# Patient Record
Sex: Male | Born: 1970 | Race: White | Hispanic: No | Marital: Married | Smoking: Former smoker
Health system: Southern US, Community
[De-identification: ages and names within clinical notes are randomized; demographics above are authoritative.]

## PROBLEM LIST (undated history)

## (undated) DIAGNOSIS — Z87891 Personal history of nicotine dependence: Secondary | ICD-10-CM

## (undated) DIAGNOSIS — F430 Acute stress reaction: Secondary | ICD-10-CM

## (undated) DIAGNOSIS — F419 Anxiety disorder, unspecified: Secondary | ICD-10-CM

## (undated) DIAGNOSIS — M1990 Unspecified osteoarthritis, unspecified site: Secondary | ICD-10-CM

## (undated) DIAGNOSIS — I10 Essential (primary) hypertension: Secondary | ICD-10-CM

## (undated) DIAGNOSIS — R5383 Other fatigue: Secondary | ICD-10-CM

## (undated) DIAGNOSIS — Z720 Tobacco use: Secondary | ICD-10-CM

## (undated) DIAGNOSIS — I739 Peripheral vascular disease, unspecified: Secondary | ICD-10-CM

## (undated) DIAGNOSIS — M170 Bilateral primary osteoarthritis of knee: Secondary | ICD-10-CM

## (undated) DIAGNOSIS — M545 Low back pain, unspecified: Secondary | ICD-10-CM

## (undated) DIAGNOSIS — E291 Testicular hypofunction: Secondary | ICD-10-CM

## (undated) DIAGNOSIS — E785 Hyperlipidemia, unspecified: Secondary | ICD-10-CM

## (undated) DIAGNOSIS — M542 Cervicalgia: Principal | ICD-10-CM

## (undated) DIAGNOSIS — B019 Varicella without complication: Secondary | ICD-10-CM

## (undated) DIAGNOSIS — G8929 Other chronic pain: Secondary | ICD-10-CM

## (undated) DIAGNOSIS — F411 Generalized anxiety disorder: Secondary | ICD-10-CM

## (undated) HISTORY — DX: Bilateral primary osteoarthritis of knee: M17.0

## (undated) HISTORY — DX: Peripheral vascular disease, unspecified: I73.9

## (undated) HISTORY — DX: Generalized anxiety disorder: F41.1

## (undated) HISTORY — DX: Personal history of nicotine dependence: Z87.891

## (undated) HISTORY — DX: Tobacco use: Z72.0

## (undated) HISTORY — DX: Hyperlipidemia, unspecified: E78.5

## (undated) HISTORY — PX: MULTIPLE TOOTH EXTRACTIONS: SHX2053

## (undated) HISTORY — DX: Essential (primary) hypertension: I10

## (undated) HISTORY — PX: WISDOM TOOTH EXTRACTION: SHX21

## (undated) HISTORY — DX: Varicella without complication: B01.9

## (undated) HISTORY — DX: Other fatigue: R53.83

## (undated) HISTORY — PX: KNEE ARTHROSCOPY: SHX127

## (undated) HISTORY — DX: Acute stress reaction: F43.0

## (undated) HISTORY — DX: Anxiety disorder, unspecified: F41.9

## (undated) HISTORY — PX: BACK SURGERY: SHX140

## (undated) HISTORY — DX: Cervicalgia: M54.2

## (undated) HISTORY — DX: Testicular hypofunction: E29.1

---

## 2012-08-19 ENCOUNTER — Encounter: Payer: Self-pay | Admitting: Family Medicine

## 2012-08-19 ENCOUNTER — Ambulatory Visit (INDEPENDENT_AMBULATORY_CARE_PROVIDER_SITE_OTHER): Payer: BC Managed Care – PPO | Admitting: Family Medicine

## 2012-08-19 VITALS — BP 140/80 | HR 75 | Temp 97.3°F | Ht 70.0 in | Wt 184.0 lb

## 2012-08-19 DIAGNOSIS — Z720 Tobacco use: Secondary | ICD-10-CM

## 2012-08-19 DIAGNOSIS — M170 Bilateral primary osteoarthritis of knee: Secondary | ICD-10-CM | POA: Insufficient documentation

## 2012-08-19 DIAGNOSIS — Z0000 Encounter for general adult medical examination without abnormal findings: Secondary | ICD-10-CM

## 2012-08-19 DIAGNOSIS — F17200 Nicotine dependence, unspecified, uncomplicated: Secondary | ICD-10-CM

## 2012-08-19 DIAGNOSIS — Z87891 Personal history of nicotine dependence: Secondary | ICD-10-CM | POA: Insufficient documentation

## 2012-08-19 DIAGNOSIS — M5410 Radiculopathy, site unspecified: Secondary | ICD-10-CM

## 2012-08-19 DIAGNOSIS — E785 Hyperlipidemia, unspecified: Secondary | ICD-10-CM

## 2012-08-19 DIAGNOSIS — IMO0002 Reserved for concepts with insufficient information to code with codable children: Secondary | ICD-10-CM

## 2012-08-19 DIAGNOSIS — M542 Cervicalgia: Secondary | ICD-10-CM

## 2012-08-19 LAB — RENAL FUNCTION PANEL
Albumin: 4.2 g/dL (ref 3.5–5.2)
BUN: 8 mg/dL (ref 6–23)
CO2: 31 mEq/L (ref 19–32)
Calcium: 9.6 mg/dL (ref 8.4–10.5)
Creatinine, Ser: 0.9 mg/dL (ref 0.4–1.5)
Sodium: 140 mEq/L (ref 135–145)

## 2012-08-19 LAB — CBC
HCT: 42.6 % (ref 39.0–52.0)
Hemoglobin: 14.1 g/dL (ref 13.0–17.0)
Platelets: 191 10*3/uL (ref 150.0–400.0)
RBC: 4.65 Mil/uL (ref 4.22–5.81)
WBC: 8.1 10*3/uL (ref 4.5–10.5)

## 2012-08-19 LAB — HEPATIC FUNCTION PANEL
AST: 29 U/L (ref 0–37)
Albumin: 4.2 g/dL (ref 3.5–5.2)
Alkaline Phosphatase: 75 U/L (ref 39–117)

## 2012-08-19 LAB — TSH: TSH: 0.31 u[IU]/mL — ABNORMAL LOW (ref 0.35–5.50)

## 2012-08-19 LAB — LIPID PANEL
Total CHOL/HDL Ratio: 8
VLDL: 63.8 mg/dL — ABNORMAL HIGH (ref 0.0–40.0)

## 2012-08-19 MED ORDER — PREDNISONE 20 MG PO TABS: ORAL_TABLET | ORAL | Status: DC

## 2012-08-19 MED ORDER — CYCLOBENZAPRINE HCL 10 MG PO TABS: 10.0000 mg | ORAL_TABLET | Freq: Three times a day (TID) | ORAL | Status: DC | PRN

## 2012-08-19 NOTE — Patient Instructions (Addendum)
Preventive Care for Adults, Male A healthy lifestyle and preventative care can promote health and wellness. Preventative health guidelines for men include the following key practices:  A routine yearly physical is a good way to check with your caregiver about your health and preventative screening. It is a chance to share any concerns and updates on your health, and to receive a thorough exam.   Visit your dentist for a routine exam and preventative care every 6 months. Brush your teeth twice a day and floss once a day. Good oral hygiene prevents tooth decay and gum disease.   The frequency of eye exams is based on your age, health, family medical history, use of contact lenses, and other factors. Follow your caregiver's recommendations for frequency of eye exams.   Eat a healthy diet. Foods like vegetables, fruits, whole grains, low-fat dairy products, and lean protein foods contain the nutrients you need without too many calories. Decrease your intake of foods high in solid fats, added sugars, and salt. Eat the right amount of calories for you.Get information about a proper diet from your caregiver, if necessary.   Regular physical exercise is one of the most important things you can do for your health. Most adults should get at least 150 minutes of moderate-intensity exercise (any activity that increases your heart rate and causes you to sweat) each week. In addition, most adults need muscle-strengthening exercises on 2 or more days a week.   Maintain a healthy weight. The body mass index (BMI) is a screening tool to identify possible weight problems. It provides an estimate of body fat based on height and weight. Your caregiver can help determine your BMI, and can help you achieve or maintain a healthy weight.For adults 20 years and older:   A BMI below 18.5 is considered underweight.   A BMI of 18.5 to 24.9 is normal.   A BMI of 25 to 29.9 is considered overweight.   A BMI of 30 and above  is considered obese.   Maintain normal blood lipids and cholesterol levels by exercising and minimizing your intake of saturated fat. Eat a balanced diet with plenty of fruit and vegetables. Blood tests for lipids and cholesterol should begin at age 20 and be repeated every 5 years. If your lipid or cholesterol levels are high, you are over 50, or you are a high risk for heart disease, you may need your cholesterol levels checked more frequently.Ongoing high lipid and cholesterol levels should be treated with medicines if diet and exercise are not effective.   If you smoke, find out from your caregiver how to quit. If you do not use tobacco, do not start.   If you choose to drink alcohol, do not exceed 2 drinks per day. One drink is considered to be 12 ounces (355 mL) of beer, 5 ounces (148 mL) of wine, or 1.5 ounces (44 mL) of liquor.   Avoid use of street drugs. Do not share needles with anyone. Ask for help if you need support or instructions about stopping the use of drugs.   High blood pressure causes heart disease and increases the risk of stroke. Your blood pressure should be checked at least every 1 to 2 years. Ongoing high blood pressure should be treated with medicines, if weight loss and exercise are not effective.   If you are 45 to 43 years old, ask your caregiver if you should take aspirin to prevent heart disease.   Diabetes screening involves taking a blood   sample to check your fasting blood sugar level. This should be done once every 3 years, after age 45, if you are within normal weight and without risk factors for diabetes. Testing should be considered at a younger age or be carried out more frequently if you are overweight and have at least 1 risk factor for diabetes.   Colorectal cancer can be detected and often prevented. Most routine colorectal cancer screening begins at the age of 50 and continues through age 75. However, your caregiver may recommend screening at an earlier  age if you have risk factors for colon cancer. On a yearly basis, your caregiver may provide home test kits to check for hidden blood in the stool. Use of a small camera at the end of a tube, to directly examine the colon (sigmoidoscopy or colonoscopy), can detect the earliest forms of colorectal cancer. Talk to your caregiver about this at age 50, when routine screening begins. Direct examination of the colon should be repeated every 5 to 10 years through age 75, unless early forms of pre-cancerous polyps or small growths are found.   Hepatitis C blood testing is recommended for all people born from 1945 through 1965 and any individual with known risks for hepatitis C.   Practice safe sex. Use condoms and avoid high-risk sexual practices to reduce the spread of sexually transmitted infections (STIs). STIs include gonorrhea, chlamydia, syphilis, trichomonas, herpes, HPV, and human immunodeficiency virus (HIV). Herpes, HIV, and HPV are viral illnesses that have no cure. They can result in disability, cancer, and death.   A one-time screening for abdominal aortic aneurysm (AAA) and surgical repair of large AAAs by sound wave imaging (ultrasonography) is recommended for ages 65 to 75 years who are current or former smokers.   Healthy men should no longer receive prostate-specific antigen (PSA) blood tests as part of routine cancer screening. Consult with your caregiver about prostate cancer screening.   Testicular cancer screening is not recommended for adult males who have no symptoms. Screening includes self-exam, caregiver exam, and other screening tests. Consult with your caregiver about any symptoms you have or any concerns you have about testicular cancer.   Use sunscreen with skin protection factor (SPF) of 30 or more. Apply sunscreen liberally and repeatedly throughout the day. You should seek shade when your shadow is shorter than you. Protect yourself by wearing long sleeves, pants, a  wide-brimmed hat, and sunglasses year round, whenever you are outdoors.   Once a month, do a whole body skin exam, using a mirror to look at the skin on your back. Notify your caregiver of new moles, moles that have irregular borders, moles that are larger than a pencil eraser, or moles that have changed in shape or color.   Stay current with required immunizations.   Influenza. You need a dose every fall (or winter). The composition of the flu vaccine changes each year, so being vaccinated once is not enough.   Pneumococcal polysaccharide. You need 1 to 2 doses if you smoke cigarettes or if you have certain chronic medical conditions. You need 1 dose at age 65 (or older) if you have never been vaccinated.   Tetanus, diphtheria, pertussis (Tdap, Td). Get 1 dose of Tdap vaccine if you are younger than age 65 years, are over 65 and have contact with an infant, are a healthcare worker, or simply want to be protected from whooping cough. After that, you need a Td booster dose every 10 years. Consult your caregiver if   you have not had at least 3 tetanus and diphtheria-containing shots sometime in your life or have a deep or dirty wound.   HPV. This vaccine is recommended for males 13 through 43 years of age. This vaccine may be given to men 22 through 43 years of age who have not completed the 3 dose series. It is recommended for men through age 26 who have sex with men or whose immune system is weakened because of HIV infection, other illness, or medications. The vaccine is given in 3 doses over 6 months.   Measles, mumps, rubella (MMR). You need at least 1 dose of MMR if you were born in 1957 or later. You may also need a 2nd dose.   Meningococcal. If you are age 19 to 21 years and a first-year college student living in a residence hall, or have one of several medical conditions, you need to get vaccinated against meningococcal disease. You may also need additional booster doses.   Zoster (shingles).  If you are age 60 years or older, you should get this vaccine.   Varicella (chickenpox). If you have never had chickenpox or you were vaccinated but received only 1 dose, talk to your caregiver to find out if you need this vaccine.   Hepatitis A. You need this vaccine if you have a specific risk factor for hepatitis A virus infection, or you simply wish to be protected from this disease. The vaccine is usually given as 2 doses, 6 to 18 months apart.   Hepatitis B. You need this vaccine if you have a specific risk factor for hepatitis B virus infection or you simply wish to be protected from this disease. The vaccine is given in 3 doses, usually over 6 months.  Preventative Service / Frequency Ages 19 to 39  Blood pressure check.** / Every 1 to 2 years.   Lipid and cholesterol check.** / Every 5 years beginning at age 20.   Hepatitis C blood test.** / For any individual with known risks for hepatitis C.   Skin self-exam. / Monthly.   Influenza immunization.** / Every year.   Pneumococcal polysaccharide immunization.** / 1 to 2 doses if you smoke cigarettes or if you have certain chronic medical conditions.   Tetanus, diphtheria, pertussis (Tdap,Td) immunization. / A one-time dose of Tdap vaccine. After that, you need a Td booster dose every 10 years.   HPV immunization. / 3 doses over 6 months, if 26 and younger.   Measles, mumps, rubella (MMR) immunization. / You need at least 1 dose of MMR if you were born in 1957 or later. You may also need a 2nd dose.   Meningococcal immunization. / 1 dose if you are age 19 to 21 years and a first-year college student living in a residence hall, or have one of several medical conditions, you need to get vaccinated against meningococcal disease. You may also need additional booster doses.   Varicella immunization.** / Consult your caregiver.   Hepatitis A immunization.** / Consult your caregiver. 2 doses, 6 to 18 months apart.   Hepatitis B  immunization.** / Consult your caregiver. 3 doses usually over 6 months.  Ages 40 to 64  Blood pressure check.** / Every 1 to 2 years.   Lipid and cholesterol check.** / Every 5 years beginning at age 20.   Fecal occult blood test (FOBT) of stool. / Every year beginning at age 50 and continuing until age 75. You may not have to do this test if   you get colonoscopy every 10 years.   Flexible sigmoidoscopy** or colonoscopy.** / Every 5 years for a flexible sigmoidoscopy or every 10 years for a colonoscopy beginning at age 50 and continuing until age 75.   Hepatitis C blood test.** / For all people born from 1945 through 1965 and any individual with known risks for hepatitis C.   Skin self-exam. / Monthly.   Influenza immunization.** / Every year.   Pneumococcal polysaccharide immunization.** / 1 to 2 doses if you smoke cigarettes or if you have certain chronic medical conditions.   Tetanus, diphtheria, pertussis (Tdap/Td) immunization.** / A one-time dose of Tdap vaccine. After that, you need a Td booster dose every 10 years.   Measles, mumps, rubella (MMR) immunization. / You need at least 1 dose of MMR if you were born in 1957 or later. You may also need a 2nd dose.   Varicella immunization.**/ Consult your caregiver.   Meningococcal immunization.** / Consult your caregiver.   Hepatitis A immunization.** / Consult your caregiver. 2 doses, 6 to 18 months apart.   Hepatitis B immunization.** / Consult your caregiver. 3 doses, usually over 6 months.  Ages 65 and over  Blood pressure check.** / Every 1 to 2 years.   Lipid and cholesterol check.**/ Every 5 years beginning at age 20.   Fecal occult blood test (FOBT) of stool. / Every year beginning at age 50 and continuing until age 75. You may not have to do this test if you get colonoscopy every 10 years.   Flexible sigmoidoscopy** or colonoscopy.** / Every 5 years for a flexible sigmoidoscopy or every 10 years for a colonoscopy  beginning at age 50 and continuing until age 75.   Hepatitis C blood test.** / For all people born from 1945 through 1965 and any individual with known risks for hepatitis C.   Abdominal aortic aneurysm (AAA) screening.** / A one-time screening for ages 65 to 75 years who are current or former smokers.   Skin self-exam. / Monthly.   Influenza immunization.** / Every year.   Pneumococcal polysaccharide immunization.** / 1 dose at age 65 (or older) if you have never been vaccinated.   Tetanus, diphtheria, pertussis (Tdap, Td) immunization. / A one-time dose of Tdap vaccine if you are over 65 and have contact with an infant, are a healthcare worker, or simply want to be protected from whooping cough. After that, you need a Td booster dose every 10 years.   Varicella immunization. ** / Consult your caregiver.   Meningococcal immunization.** / Consult your caregiver.   Hepatitis A immunization. ** / Consult your caregiver. 2 doses, 6 to 18 months apart.   Hepatitis B immunization.** / Check with your caregiver. 3 doses, usually over 6 months.  **Family history and personal history of risk and conditions may change your caregiver's recommendations. Document Released: 01/14/2002 Document Revised: 11/07/2011 Document Reviewed: 04/15/2011 [employer] Patient Information 2012 [employer]. 

## 2012-08-20 LAB — T4, FREE: Free T4: 0.76 ng/dL (ref 0.60–1.60)

## 2012-08-23 ENCOUNTER — Encounter: Payer: Self-pay | Admitting: Family Medicine

## 2012-08-23 DIAGNOSIS — Z0000 Encounter for general adult medical examination without abnormal findings: Secondary | ICD-10-CM | POA: Insufficient documentation

## 2012-08-23 DIAGNOSIS — M542 Cervicalgia: Secondary | ICD-10-CM

## 2012-08-23 DIAGNOSIS — F419 Anxiety disorder, unspecified: Secondary | ICD-10-CM

## 2012-08-23 DIAGNOSIS — E785 Hyperlipidemia, unspecified: Secondary | ICD-10-CM | POA: Insufficient documentation

## 2012-08-23 HISTORY — DX: Hyperlipidemia, unspecified: E78.5

## 2012-08-23 HISTORY — DX: Anxiety disorder, unspecified: F41.9

## 2012-08-23 HISTORY — DX: Cervicalgia: M54.2

## 2012-08-23 NOTE — Assessment & Plan Note (Signed)
Avoid trans fats, start MegaRed caps, increase exercise.

## 2012-08-23 NOTE — Assessment & Plan Note (Signed)
Will request old records, will monitor bp. Patient declines flu shot and Tdap.

## 2012-08-23 NOTE — Assessment & Plan Note (Signed)
Encouraged complete cessation. Consider e cigarette. Counseled regarding ways to cut back and quit

## 2012-08-23 NOTE — Assessment & Plan Note (Signed)
1 month ago he was moving a 400 pound child around on a dolly when he slipped off and fell on the top of his head. His coworker was able to push the side for complete flattening him. 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 He did go to ER that day and reports the x-rays were unremarkable but has had no significant further care since then. Medications have been minimally helpful. He does note tingling although he and his fingertips and pain most notably in his bicep. He notes when he squeezes the biceps the pain is somewhat relieved. He is started on a short course of steroids and given some Flexeril. One sister to completed he can restart NSAIDs. He is referred to neurosurgery for further intervention given the mechanism of his injury and the likelihood that he suffered some significant injury. He will seek immediate medical care symptoms worsen.

## 2012-08-23 NOTE — Progress Notes (Signed)
Patient ID: Elijah Marquez, male   DOB: [birth year], 41 y.o.   MRN: [id number] Elijah Marquez [id number] 1971-11-03 08/23/2012      Progress Note New Patient  Subjective  Chief Complaint  Chief Complaint  Patient presents with  . Establish Care    new patient    HPI  Patient is a 41 year old Caucasian male in today to establish care secondary to an injury he had last month. 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 It landed with direct force on the top of his head just above his forehead. Fortunately his coworker was able to push it to the side before it fully flattened him. He did present to the ER the first day and x-ray he reports was unremarkable but unfortunately since his and persistently and pain. He denies any loss of consciousness any visual or hearing changes or neurologic symptoms today the accident. He has not been left with persistent headache rating memory loss. Unfortunately he has persistent left-sided neck pain as well as radicular symptoms all the way down the left arm. He describes the pain in the shoulder but also down the arm tingling in the fingertips. He notes that he squeezes his bicep the pain is somewhat improved. Otherwise she reports using the health. He's had no other recent illness, fevers, chills, chest pain, palpitations, shortness of breath, GI or GU complaints. He notes he gets minimal relief for a very short period time with NSAIDs.  Past Medical History  Diagnosis Date  . Chicken pox as a child  . Osteoarthritis of left knee     and right knee  . Tobacco abuse   . Hyperlipidemia   . Neck pain on left side 08/23/2012  . Preventative health care 08/23/2012    Past Surgical History  Procedure Date  . Knee surgery     right knee 2012, left knee 2011  . Wisdom tooth extraction       Family History  Problem Relation Age of Onset  . Fibromyalgia Mother   . Coronary artery disease Mother   . Hypertension Mother   . Heart attack Mother   . Fibromyalgia Sister   . Hypertension Sister   . Heart disease Sister   . Other Son     mildly mentally handicap  . Seizures Son   . Lupus Sister   . Emphysema Sister     smoker    History   Social History  . Marital Status: Married    Spouse Name: N/A    Number of Children: N/A  . Years of Education: N/A   Occupational History  . Not on file.   Social History Main Topics  . Smoking status: Current Every Day Smoker -- 1.5 packs/day for 25 years    Types: Cigarettes  . Smokeless tobacco: Never Used  . Alcohol Use: No  . Drug Use: No  . Sexually Active: Yes -- Male partner(s)   Other Topics Concern  . Not on file   Social History Narrative  . No narrative on file    Current Outpatient Prescriptions on File Prior to Visit  Medication Sig Dispense Refill  . citalopram (CELEXA) 20 MG tablet Take 20 mg by mouth daily.        Allergies  Allergen Reactions  . Penicillins Swelling  and Rash    Review of Systems  Review of Systems  Constitutional: Negative for fever, chills and malaise/fatigue.  HENT: Positive for neck pain. Negative for hearing loss, nosebleeds and congestion.   Eyes: Negative for discharge.  Respiratory: Negative for cough, sputum production, shortness of breath and wheezing.   Cardiovascular: Negative for chest pain, palpitations and leg swelling.  Gastrointestinal: Negative for heartburn, nausea, vomiting, abdominal pain, diarrhea, constipation and blood in stool.  Genitourinary: Negative for dysuria, urgency, frequency and hematuria.  Musculoskeletal: Positive for joint pain. Negative for myalgias, back pain and falls.       Left sided pain base of skull to left hand  Skin: Negative for rash.  Neurological: Negative for dizziness, tremors, sensory change, focal weakness, loss of  consciousness, weakness and headaches.  Endo/Heme/Allergies: Negative for polydipsia. Does not bruise/bleed easily.  Psychiatric/Behavioral: Negative for depression and suicidal ideas. The patient is not nervous/anxious and does not have insomnia.     Objective  BP 140/80  Pulse 75  Temp 97.3 F (36.3 C) (Temporal)  Ht 5\' 10"  (1.778 m)  Wt 184 lb (83.462 kg)  BMI 26.40 kg/m2  SpO2 97%  Physical Exam  Physical Exam  Constitutional: He is oriented to person, place, and time and well-developed, well-nourished, and in no distress. No distress.  HENT:  Head: Normocephalic and atraumatic.  Eyes: Conjunctivae normal are normal.  Neck: Neck supple. No thyromegaly present.  Cardiovascular: Normal rate, regular rhythm and normal heart sounds.   No murmur heard. Pulmonary/Chest: Effort normal and breath sounds normal. No respiratory distress.  Abdominal: He exhibits no distension and no mass. There is no tenderness.  Musculoskeletal: He exhibits no edema.  Neurological: He is alert and oriented to person, place, and time.  Skin: Skin is warm.  Psychiatric: Memory, affect and judgment normal.       Assessment & Plan  Neck pain on left side 1 month ago he was moving a 400 pound child around on a dolly when he slipped off and fell on the top of his head. His coworker was able to push the side for complete flattening him. 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 He did go to ER that day and reports the x-rays were unremarkable but has had no significant further care since then. Medications have been minimally helpful. He does note tingling although he and his fingertips and pain most notably in his bicep. He notes when he squeezes the biceps the pain is somewhat relieved. He is started on a short course of steroids and given some Flexeril. One sister to completed he can restart NSAIDs. He is  referred to neurosurgery for further intervention given the mechanism of his injury and the likelihood that he suffered some significant injury. He will seek immediate medical care symptoms worsen.  Tobacco abuse Encouraged complete cessation. Consider e cigarette. Counseled regarding ways to cut back and quit  Preventative health care Will request old records, will monitor bp. Patient declines flu shot and Tdap.   Hyperlipidemia Avoid trans fats, start MegaRed caps, increase exercise.

## 2012-08-24 ENCOUNTER — Ambulatory Visit
Admission: RE | Admit: 2012-08-24 | Discharge: 2012-08-24 | Disposition: A | Discharge status: Home / Self Care | Payer: BC Managed Care – PPO | Source: Ambulatory Visit | Attending: Neurosurgery | Admitting: Neurosurgery

## 2012-08-24 ENCOUNTER — Other Ambulatory Visit: Payer: Self-pay | Admitting: Neurosurgery

## 2012-08-24 DIAGNOSIS — M542 Cervicalgia: Secondary | ICD-10-CM

## 2012-08-24 DIAGNOSIS — M5410 Radiculopathy, site unspecified: Secondary | ICD-10-CM

## 2012-08-25 ENCOUNTER — Encounter: Payer: Self-pay | Admitting: Family Medicine

## 2012-09-18 ENCOUNTER — Ambulatory Visit (INDEPENDENT_AMBULATORY_CARE_PROVIDER_SITE_OTHER): Payer: BC Managed Care – PPO | Admitting: Family Medicine

## 2012-09-18 ENCOUNTER — Encounter: Payer: Self-pay | Admitting: Family Medicine

## 2012-09-18 VITALS — BP 152/95 | HR 84 | Temp 97.6°F | Ht 70.0 in | Wt 180.8 lb

## 2012-09-18 DIAGNOSIS — F341 Dysthymic disorder: Secondary | ICD-10-CM

## 2012-09-18 DIAGNOSIS — F329 Major depressive disorder, single episode, unspecified: Secondary | ICD-10-CM

## 2012-09-18 DIAGNOSIS — Z720 Tobacco use: Secondary | ICD-10-CM

## 2012-09-18 DIAGNOSIS — F17200 Nicotine dependence, unspecified, uncomplicated: Secondary | ICD-10-CM

## 2012-09-18 DIAGNOSIS — F32A Depression, unspecified: Secondary | ICD-10-CM

## 2012-09-18 DIAGNOSIS — F411 Generalized anxiety disorder: Secondary | ICD-10-CM

## 2012-09-18 DIAGNOSIS — R4589 Other symptoms and signs involving emotional state: Secondary | ICD-10-CM

## 2012-09-18 DIAGNOSIS — M542 Cervicalgia: Secondary | ICD-10-CM

## 2012-09-18 DIAGNOSIS — R52 Pain, unspecified: Secondary | ICD-10-CM

## 2012-09-18 MED ORDER — OXYCODONE-ACETAMINOPHEN 7.5-325 MG PO TABS: 1.0000 | ORAL_TABLET | ORAL | Status: DC | PRN

## 2012-09-18 MED ORDER — NAPROXEN 500 MG PO TABS: 500.0000 mg | ORAL_TABLET | Freq: Two times a day (BID) | ORAL | Status: DC

## 2012-09-18 MED ORDER — METHOCARBAMOL 500 MG PO TABS: 500.0000 mg | ORAL_TABLET | Freq: Three times a day (TID) | ORAL | Status: DC | PRN

## 2012-09-18 MED ORDER — CITALOPRAM HYDROBROMIDE 20 MG PO TABS: 20.0000 mg | ORAL_TABLET | Freq: Every day | ORAL | Status: DC

## 2012-09-18 MED ORDER — DICLOFENAC SODIUM 3 % TD GEL: TRANSDERMAL | Status: DC

## 2012-09-20 ENCOUNTER — Encounter: Payer: Self-pay | Admitting: Family Medicine

## 2012-09-20 DIAGNOSIS — F411 Generalized anxiety disorder: Secondary | ICD-10-CM

## 2012-09-20 DIAGNOSIS — F419 Anxiety disorder, unspecified: Secondary | ICD-10-CM | POA: Insufficient documentation

## 2012-09-20 DIAGNOSIS — F430 Acute stress reaction: Secondary | ICD-10-CM

## 2012-09-20 HISTORY — DX: Generalized anxiety disorder: F43.0

## 2012-09-20 HISTORY — DX: Generalized anxiety disorder: F41.1

## 2012-09-20 NOTE — Progress Notes (Signed)
Patient ID: Elijah Marquez, male   DOB: 08-06-1971, 41 y.o.   MRN: [id number] Elijah Marquez [id number] 1971/07/13 09/20/2012      Progress Note-Follow Up  Subjective  Chief Complaint  Chief Complaint  Patient presents with  . Follow-up    1 month    HPI  Patient is a 41 year old Caucasian male who is in today for followup on neck pain. He has been seen by neurosurgery an MRI has confirmed to have significant pathology in his back secondary to his recent work related injury. He has chronic neck pain to the point where he has trouble sleeping and concentrating. He has pain into his left shoulder now has paresthesias and pain all the way into his left hand. The no new injuries or new illness. He does continue to smoke. He still struggling with anxiety due to the pain and having trouble sleeping. No chest pain, palpitations, shortness of breath, GI or GU complaints noted today appear  Past Medical History  Diagnosis Date  . Chicken pox as a child  . Osteoarthritis of left knee     and right knee  . Tobacco abuse   . Hyperlipidemia   . Neck pain on left side 08/23/2012  . Anxiety 08/23/2012  . Hyperlipidemia 08/23/2012  . Fatigue   . Anxiety as acute reaction to exceptional stress 09/20/2012    Past Surgical History  Procedure Date  . Knee surgery     right knee 2012, left knee 2011 (arthroscopy)  . Wisdom tooth extraction     Family History  Problem Relation Age of Onset  . Fibromyalgia Mother   . Coronary artery disease Mother   . Hypertension Mother   . Heart attack Mother   . Fibromyalgia Sister   . Hypertension Sister   . Heart disease Sister   . Other Son     mildly mentally handicap  . Seizures Son   . Lupus Sister   . Emphysema Sister     smoker    History   Social History  . Marital Status: Married    Spouse Name: N/A    Number of Children: N/A  . Years of Education: N/A   Occupational History  . Not on file.   Social History Main Topics  . Smoking  status: Current Every Day Smoker -- 1.5 packs/day for 25 years    Types: Cigarettes  . Smokeless tobacco: Never Used  . Alcohol Use: No  . Drug Use: No  . Sexually Active: Yes -- Male partner(s)   Other Topics Concern  . Not on file   Social History Narrative  . No narrative on file    Current Outpatient Prescriptions on File Prior to Visit  Medication Sig Dispense Refill  . ALPRAZolam (XANAX) 1 MG tablet Take 1 mg by mouth daily as needed.      . citalopram (CELEXA) 20 MG tablet Take 1 tablet (20 mg total) by mouth daily.  30 tablet  1  . cyclobenzaprine (FLEXERIL) 10 MG tablet Take 1 tablet (10 mg total) by mouth 3 (three) times daily as needed for muscle spasms.  30 tablet  0    Allergies  Allergen Reactions  . Penicillins Swelling and Rash    Review of Systems  Review of Systems  Constitutional: Negative for fever and malaise/fatigue.  HENT: Negative for congestion.   Eyes: Negative for discharge.  Respiratory: Negative for shortness of breath.   Cardiovascular: Negative for chest pain, palpitations and leg swelling.  Gastrointestinal: Negative  for nausea, abdominal pain and diarrhea.  Genitourinary: Negative for dysuria.  Musculoskeletal: Positive for myalgias. Negative for falls.  Skin: Negative for rash.  Neurological: Positive for tingling, sensory change and focal weakness. Negative for loss of consciousness and headaches.       Has begun to notice numbness and paresthesias in to left hand fingers 3 and 4  Endo/Heme/Allergies: Negative for polydipsia.  Psychiatric/Behavioral: Negative for depression and suicidal ideas. The patient is not nervous/anxious and does not have insomnia.     Objective  BP 152/95  Pulse 84  Temp 97.6 F (36.4 C) (Temporal)  Ht 5\' 10"  (1.778 m)  Wt 180 lb 12.8 oz (82.01 kg)  BMI 25.94 kg/m2  SpO2 98%  Physical Exam  Physical Exam  Constitutional: He is oriented to person, place, and time and well-developed, well-nourished,  and in no distress. No distress.  HENT:  Head: Normocephalic and atraumatic.  Eyes: Conjunctivae normal are normal.  Neck: Neck supple. No thyromegaly present.  Cardiovascular: Normal rate, regular rhythm and normal heart sounds.   No murmur heard. Pulmonary/Chest: Effort normal and breath sounds normal. No respiratory distress.  Abdominal: He exhibits no distension and no mass. There is no tenderness.  Musculoskeletal: He exhibits tenderness. He exhibits no edema.       Tender over left SCM, decreased rom left shoulder secondary to pain  Neurological: He is alert and oriented to person, place, and time.  Skin: Skin is warm.  Psychiatric: Memory, affect and judgment normal.    Lab Results  Component Value Date   TSH 0.31* 08/19/2012   Lab Results  Component Value Date   WBC 8.1 08/19/2012   HGB 14.1 08/19/2012   HCT 42.6 08/19/2012   MCV 91.7 08/19/2012   PLT 191.0 08/19/2012   Lab Results  Component Value Date   CREATININE 0.9 08/19/2012   BUN 8 08/19/2012   NA 140 08/19/2012   K 4.2 08/19/2012   CL 103 08/19/2012   CO2 31 08/19/2012   Lab Results  Component Value Date   ALT 42 08/19/2012   AST 29 08/19/2012   ALKPHOS 75 08/19/2012   BILITOT 0.4 08/19/2012   Lab Results  Component Value Date   CHOL 213* 08/19/2012   Lab Results  Component Value Date   HDL 27.70* 08/19/2012   No results found for this basename: LDLCALC   Lab Results  Component Value Date   TRIG 319.0* 08/19/2012   Lab Results  Component Value Date   CHOLHDL 8 08/19/2012     Assessment & Plan  Neck pain on left side Is now following with [facility] who have confirmed that he needs surgery to stabilize his neck s/p his injury at work. He has near debilitating pain but continues to work and hopes to continue doing so. He is having trouble sleeping or finding a comfortable position during the day. He is given a refill on Percocet 7.5 to use prn, can try Robaxin bid during the day and may maintain  Flexeril qhs, Dr Renaye Rakers would like to take him to surgery but they are waiting on approval. Patient is also given a transdermal topical ointment to try prn. No heavy lifting or strenuous activity.   Tobacco abuse Unfortunately the stress of all this pain and lack of sleep is keeping his smoking habit alive, encouraged cessation.  Anxiety as acute reaction to exceptional stress Secondary to all his pain, Celexa 20 mg daily is tolerated but not fully effectviely, may try switch  to Venlafaxine at next visit.

## 2012-09-20 NOTE — Assessment & Plan Note (Signed)
Secondary to all his pain, Celexa 20 mg daily is tolerated but not fully effectviely, may try switch to Venlafaxine at next visit.

## 2012-09-20 NOTE — Assessment & Plan Note (Signed)
Is now following with [facility] who have confirmed that he needs surgery to stabilize his neck s/p his injury at work. He has near debilitating pain but continues to work and hopes to continue doing so. He is having trouble sleeping or finding a comfortable position during the day. He is given a refill on Percocet 7.5 to use prn, can try Robaxin bid during the day and may maintain Flexeril qhs, Dr Renaye Rakers would like to take him to surgery but they are waiting on approval. Patient is also given a transdermal topical ointment to try prn. No heavy lifting or strenuous activity.

## 2012-09-20 NOTE — Assessment & Plan Note (Signed)
Unfortunately the stress of all this pain and lack of sleep is keeping his smoking habit alive, encouraged cessation.

## 2012-10-19 ENCOUNTER — Other Ambulatory Visit: Payer: Self-pay

## 2012-10-19 ENCOUNTER — Encounter: Payer: Self-pay | Admitting: Family Medicine

## 2012-10-19 ENCOUNTER — Ambulatory Visit (INDEPENDENT_AMBULATORY_CARE_PROVIDER_SITE_OTHER): Payer: BC Managed Care – PPO | Admitting: Family Medicine

## 2012-10-19 VITALS — BP 130/79 | HR 78 | Temp 97.9°F | Ht 70.0 in | Wt 181.1 lb

## 2012-10-19 DIAGNOSIS — F341 Dysthymic disorder: Secondary | ICD-10-CM

## 2012-10-19 DIAGNOSIS — F32A Depression, unspecified: Secondary | ICD-10-CM

## 2012-10-19 DIAGNOSIS — F17200 Nicotine dependence, unspecified, uncomplicated: Secondary | ICD-10-CM

## 2012-10-19 DIAGNOSIS — M542 Cervicalgia: Secondary | ICD-10-CM

## 2012-10-19 DIAGNOSIS — R4589 Other symptoms and signs involving emotional state: Secondary | ICD-10-CM

## 2012-10-19 DIAGNOSIS — F329 Major depressive disorder, single episode, unspecified: Secondary | ICD-10-CM

## 2012-10-19 DIAGNOSIS — Z720 Tobacco use: Secondary | ICD-10-CM

## 2012-10-19 DIAGNOSIS — F411 Generalized anxiety disorder: Secondary | ICD-10-CM

## 2012-10-19 MED ORDER — ALPRAZOLAM 1 MG PO TABS: 1.0000 mg | ORAL_TABLET | Freq: Two times a day (BID) | ORAL | Status: DC | PRN

## 2012-10-19 MED ORDER — DICLOFENAC SODIUM 3 % TD GEL: TRANSDERMAL | Status: DC

## 2012-10-19 NOTE — Progress Notes (Signed)
Patient ID: Elijah Marquez, male   DOB: 1971-04-22, 41 y.o.   MRN: [id number] Tavious Griesinger [id number] 11-30-1971 10/19/2012      Progress Note-Follow Up  Subjective  Chief Complaint  Chief Complaint  Patient presents with  . Follow-up    1 month    HPI  Patient is a 41 year old Caucasian male who is in today for followup. He continues to struggle with back and left upper extremity pain and weakness but is finally scheduled for surgery next month. [facility] surgery has been scheduled for December 6. His Workmen's Comp. has been approved. His pain management is better with the addition of the transdermal therapeutics cream. He continues to take his other medications on a scheduled basis and notes improved pain control. Is now able to get more than 2 hour sleep and time and thus is also able to handle his pain better. Taking all of his medications as prescribed and denies any side effects that are concerning. No GI upset. No headaches, chest pain, palpitations, shortness of breath. Unfortunately he continues to smoke a pack and a half per day  Past Medical History  Diagnosis Date  . Chicken pox as a child  . Osteoarthritis of left knee     and right knee  . Tobacco abuse   . Hyperlipidemia   . Neck pain on left side 08/23/2012  . Anxiety 08/23/2012  . Hyperlipidemia 08/23/2012  . Fatigue   . Anxiety as acute reaction to exceptional stress 09/20/2012    Past Surgical History  Procedure Date  . Knee surgery     right knee 2012, left knee 2011 (arthroscopy)  . Wisdom tooth extraction     Family History  Problem Relation Age of Onset  . Fibromyalgia Mother   . Coronary artery disease Mother   . Hypertension Mother   . Heart attack Mother   . Fibromyalgia Sister   . Hypertension Sister   . Heart disease Sister   . Other Son     mildly mentally handicap  . Seizures Son   . Lupus Sister   . Emphysema Sister     smoker    History   Social History  . Marital Status:  Married    Spouse Name: N/A    Number of Children: N/A  . Years of Education: N/A   Occupational History  . Not on file.   Social History Main Topics  . Smoking status: Current Every Day Smoker -- 1.5 packs/day for 25 years    Types: Cigarettes  . Smokeless tobacco: Never Used  . Alcohol Use: No  . Drug Use: No  . Sexually Active: Yes -- Male partner(s)   Other Topics Concern  . Not on file   Social History Narrative  . No narrative on file    Current Outpatient Prescriptions on File Prior to Visit  Medication Sig Dispense Refill  . citalopram (CELEXA) 20 MG tablet Take 1 tablet (20 mg total) by mouth daily.  30 tablet  1  . cyclobenzaprine (FLEXERIL) 10 MG tablet Take 1 tablet (10 mg total) by mouth 3 (three) times daily as needed for muscle spasms.  30 tablet  0  . methocarbamol (ROBAXIN) 500 MG tablet Take 1 tablet (500 mg total) by mouth 3 (three) times daily as needed. Can take this during the day and Flexeril at night  90 tablet  1  . naproxen (NAPROSYN) 500 MG tablet Take 1 tablet (500 mg total) by mouth 2 (two) times daily with  a meal.  60 tablet  1  . oxyCODONE-acetaminophen (PERCOCET) 7.5-325 MG per tablet Take 1 tablet by mouth every 4 (four) hours as needed for pain.  90 tablet  0    Allergies  Allergen Reactions  . Penicillins Swelling and Rash    Review of Systems  Review of Systems  Constitutional: Negative for fever and malaise/fatigue.  HENT: Positive for neck pain. Negative for congestion.   Eyes: Negative for discharge.  Respiratory: Negative for shortness of breath.   Cardiovascular: Negative for chest pain, palpitations and leg swelling.  Gastrointestinal: Negative for nausea, abdominal pain and diarrhea.  Genitourinary: Negative for dysuria.  Musculoskeletal: Positive for myalgias. Negative for falls.  Skin: Negative for rash.  Neurological: Negative for loss of consciousness and headaches.  Endo/Heme/Allergies: Negative for polydipsia.    Psychiatric/Behavioral: Positive for depression. Negative for suicidal ideas. The patient is nervous/anxious and has insomnia.     Objective  BP 130/79  Pulse 78  Temp 97.9 F (36.6 C) (Temporal)  Ht 5\' 10"  (1.778 m)  Wt 181 lb 1.9 oz (82.155 kg)  BMI 25.99 kg/m2  SpO2 97%  Physical Exam  Physical Exam  Constitutional: He is oriented to person, place, and time and well-developed, well-nourished, and in no distress. No distress.  HENT:  Head: Normocephalic and atraumatic.  Eyes: Conjunctivae normal are normal.  Neck: Neck supple. No thyromegaly present.  Cardiovascular: Normal rate, regular rhythm and normal heart sounds.   No murmur heard. Pulmonary/Chest: Effort normal and breath sounds normal. No respiratory distress.  Abdominal: He exhibits no distension and no mass. There is no tenderness.  Musculoskeletal: He exhibits no edema.  Neurological: He is alert and oriented to person, place, and time.  Skin: Skin is warm.  Psychiatric: Memory, affect and judgment normal.    Lab Results  Component Value Date   TSH 0.31* 08/19/2012   Lab Results  Component Value Date   WBC 8.1 08/19/2012   HGB 14.1 08/19/2012   HCT 42.6 08/19/2012   MCV 91.7 08/19/2012   PLT 191.0 08/19/2012   Lab Results  Component Value Date   CREATININE 0.9 08/19/2012   BUN 8 08/19/2012   NA 140 08/19/2012   K 4.2 08/19/2012   CL 103 08/19/2012   CO2 31 08/19/2012   Lab Results  Component Value Date   ALT 42 08/19/2012   AST 29 08/19/2012   ALKPHOS 75 08/19/2012   BILITOT 0.4 08/19/2012   Lab Results  Component Value Date   CHOL 213* 08/19/2012   Lab Results  Component Value Date   HDL 27.70* 08/19/2012   No results found for this basename: LDLCALC   Lab Results  Component Value Date   TRIG 319.0* 08/19/2012   Lab Results  Component Value Date   CHOLHDL 8 08/19/2012     Assessment & Plan  Tobacco abuse Unfortunately up to 1 1/2 ppd, encouraged cessation or at least to drop as much as  possible prior to surgery try nicotine replacement products  Neck pain on left side Has surgery scheduled with [facility] December 6th. Is getting better pain relief with addition of topical gel by [employer] and his oral meds.   Anxiety as acute reaction to exceptional stress Doing better with meds and improved sleep and pain control. Continue the same, may use Alprazolam prn

## 2012-10-19 NOTE — Assessment & Plan Note (Signed)
Unfortunately up to 1 1/2 ppd, encouraged cessation or at least to drop as much as possible prior to surgery try nicotine replacement products

## 2012-10-19 NOTE — Telephone Encounter (Signed)
I spoke with pharmacist and they don't see Lisinopril on pts med list. I left a message for pt to return my call or to leave a message stating what the dosage is?

## 2012-10-19 NOTE — Assessment & Plan Note (Signed)
Doing better with meds and improved sleep and pain control. Continue the same, may use Alprazolam prn

## 2012-10-19 NOTE — Assessment & Plan Note (Signed)
Has surgery scheduled with [facility]surgery December 6th. Is getting better pain relief with addition of topical gel by [employer] and his oral meds.

## 2012-10-19 NOTE — Patient Instructions (Addendum)
Try the nicotine patches, gum or Nicotrol inhalers     Nicotine Addiction Nicotine can act as both a stimulant (excites/activates) and a sedative (calms/quiets). Immediately after exposure to nicotine, there is a "kick" caused in part by the drug's stimulation of the adrenal glands and resulting discharge of adrenaline (epinephrine). The rush of adrenaline stimulates the body and causes a sudden release of sugar. This means that smokers are always slightly hyperglycemic. Hyperglycemic means that the blood sugar is high, just like in diabetics. Nicotine also decreases the amount of insulin which helps control sugar levels in the body. There is an increase in blood pressure, breathing, and the rate of heart beats.  In addition, nicotine indirectly causes a release of dopamine in the brain that controls pleasure and motivation. A similar reaction is seen with other drugs of abuse, such as cocaine and heroin. This dopamine release is thought to cause the pleasurable sensations when smoking. In some different cases, nicotine can also create a calming effect, depending on sensitivity of the smoker's nervous system and the dose of nicotine taken. WHAT HAPPENS WHEN NICOTINE IS TAKEN FOR LONG PERIODS OF TIME?  Long-term use of nicotine results in addiction. It is difficult to stop.  Repeated use of nicotine creates tolerance. Higher doses of nicotine are needed to get the "kick." When nicotine use is stopped, withdrawal may last a month or more. Withdrawal may begin within a few hours after the last cigarette. Symptoms peak within the first few days and may lessen within a few weeks. For some people, however, symptoms may last for months or longer. Withdrawal symptoms include:   Irritability.  Craving.  Learning and attention deficits.  Sleep disturbances.  Increased appetite. Craving for tobacco may last for 6 months or longer. Many behaviors done while using nicotine can also play a part in the  severity of withdrawal symptoms. For some people, the feel, smell, and sight of a cigarette and the ritual of obtaining, handling, lighting, and smoking the cigarette are closely linked with the pleasure of smoking. When stopped, they also miss the related behaviors which make the withdrawal or craving worse. While nicotine gum and patches may lessen the drug aspects of withdrawal, cravings often persist. WHAT ARE THE MEDICAL CONSEQUENCES OF NICOTINE USE?  Nicotine addiction accounts for one-third of all cancers. The top cancer caused by tobacco is lung cancer. Lung cancer is the number one cancer killer of both men and women.  Smoking is also associated with cancers of the:  Mouth.  Pharynx.  Larynx.  Esophagus.  Stomach.  Pancreas.  Cervix.  Kidney.  Ureter.  Bladder.  Smoking also causes lung diseases such as lasting (chronic) bronchitis and emphysema.  It worsens asthma in adults and children.  Smoking increases the risk of heart disease, including:  Stroke.  Heart attack.  Vascular disease.  Aneurysm.  Passive or secondary smoke can also increase medical risks including:  Asthma in children.  Sudden Infant Death Syndrome (SIDS).  Additionally, dropped cigarettes are the leading cause of residential fire fatalities.  Nicotine poisoning has been reported from accidental ingestion of tobacco products by children and pets. Death usually results in a few minutes from respiratory failure (when a person stops breathing) caused by paralysis. TREATMENT   Medication. Nicotine replacement medicines such as nicotine gum and the patch are used to stop smoking. These medicines gradually lower the dosage of nicotine in the body. These medicines do not contain the carbon monoxide and other toxins found in tobacco  smoke.  Hypnotherapy.  Relaxation therapy.  Nicotine Anonymous (a 12-step support program). Find times and locations in your local yellow pages. Document  Released: 07/24/2004 Document Revised: 02/10/2012 Document Reviewed: 12/16/2007 [facility] Patient Information 2013 [place], [place].

## 2012-10-20 NOTE — Telephone Encounter (Signed)
Pt is going to call us back tomorrow with the dosage.

## 2012-10-20 NOTE — Telephone Encounter (Signed)
Left another message for pt to return my call.

## 2012-10-28 NOTE — Telephone Encounter (Signed)
Closing note until patient calls back with information

## 2012-11-02 ENCOUNTER — Other Ambulatory Visit: Payer: Self-pay

## 2012-11-02 DIAGNOSIS — R52 Pain, unspecified: Secondary | ICD-10-CM

## 2012-11-02 MED ORDER — OXYCODONE-ACETAMINOPHEN 7.5-325 MG PO TABS: 1.0000 | ORAL_TABLET | ORAL | Status: DC | PRN

## 2012-11-02 MED ORDER — LISINOPRIL 20 MG PO TABS: 20.0000 mg | ORAL_TABLET | Freq: Every day | ORAL | Status: DC

## 2012-11-02 NOTE — Addendum Note (Signed)
Addended by: Court Joy on: 11/02/2012 01:19 PM   Modules accepted: Orders

## 2012-11-02 NOTE — Telephone Encounter (Signed)
Patient called stating that the Lisinopril he takes is 20mg . Ok to refill? Pt also stated that his work asked him to hold off for 30 days for his surgery so he agreed to it but is out of his Oxy. Please advise refill? Last RX was wrote on 09-18-12 quantity 90 with 0 refills.

## 2012-11-02 NOTE — Telephone Encounter (Signed)
Left a detailed message on patients voicemail. RX for Lisinopril sent to pharmacy and Oxy printed and at front desk

## 2012-11-02 NOTE — Telephone Encounter (Signed)
OK to both, OK to write the Lisinopril 20 mg tab, 1 tab po daily, disp #30 with 5 rf or #90 with 1 rf according to patient preference. OK to refill his Oxycodone at same strength and same sig, disp #90.

## 2012-12-08 HISTORY — PX: ANTERIOR CERVICAL DECOMP/DISCECTOMY FUSION: SHX1161

## 2012-12-21 ENCOUNTER — Ambulatory Visit: Payer: BC Managed Care – PPO | Admitting: Family Medicine

## 2012-12-21 ENCOUNTER — Other Ambulatory Visit: Payer: Self-pay | Admitting: Family Medicine

## 2012-12-21 ENCOUNTER — Encounter: Payer: Self-pay | Admitting: Family Medicine

## 2012-12-21 ENCOUNTER — Ambulatory Visit (INDEPENDENT_AMBULATORY_CARE_PROVIDER_SITE_OTHER): Payer: BC Managed Care – PPO | Admitting: Family Medicine

## 2012-12-21 VITALS — BP 140/82 | HR 90 | Temp 98.1°F | Ht 70.0 in | Wt 178.0 lb

## 2012-12-21 DIAGNOSIS — F411 Generalized anxiety disorder: Secondary | ICD-10-CM

## 2012-12-21 DIAGNOSIS — E785 Hyperlipidemia, unspecified: Secondary | ICD-10-CM

## 2012-12-21 DIAGNOSIS — R4589 Other symptoms and signs involving emotional state: Secondary | ICD-10-CM

## 2012-12-21 DIAGNOSIS — D709 Neutropenia, unspecified: Secondary | ICD-10-CM

## 2012-12-21 DIAGNOSIS — IMO0001 Reserved for inherently not codable concepts without codable children: Secondary | ICD-10-CM

## 2012-12-21 DIAGNOSIS — M542 Cervicalgia: Secondary | ICD-10-CM

## 2012-12-21 DIAGNOSIS — R030 Elevated blood-pressure reading, without diagnosis of hypertension: Secondary | ICD-10-CM

## 2012-12-21 LAB — CBC
HCT: 43.8 % (ref 39.0–52.0)
Hemoglobin: 15.6 g/dL (ref 13.0–17.0)
MCV: 86.4 fL (ref 78.0–100.0)
RBC: 5.07 MIL/uL (ref 4.22–5.81)
RDW: 13 % (ref 11.5–15.5)
WBC: 12.8 10*3/uL — ABNORMAL HIGH (ref 4.0–10.5)

## 2012-12-21 NOTE — Patient Instructions (Addendum)
Start a probiotic such as Digestive Advantage caps daily and a fiber supplement such as Metamucil daily, drink 64 oz of clear fluids If constipation occurs then try milk of magnesium 2 tbls in 4 oz of warm prune juice by mouth and repeat in 4 hours with a dulcolax suppository per rectum if no results    Constipation, Adult Constipation is when a person has fewer than 3 bowel movements a week; has difficulty having a bowel movement; or has stools that are dry, hard, or larger than normal. As people grow older, constipation is more common. If you try to fix constipation with medicines that make you have a bowel movement (laxatives), the problem may get worse. Long-term laxative use may cause the muscles of the colon to become weak. A low-fiber diet, not taking in enough fluids, and taking certain medicines may make constipation worse. CAUSES   Certain medicines, such as antidepressants, pain medicine, iron supplements, antacids, and water pills.   Certain diseases, such as diabetes, irritable bowel syndrome (IBS), thyroid disease, or depression.   Not drinking enough water.   Not eating enough fiber-rich foods.   Stress or travel.  Lack of physical activity or exercise.  Not going to the restroom when there is the urge to have a bowel movement.  Ignoring the urge to have a bowel movement.  Using laxatives too much. SYMPTOMS   Having fewer than 3 bowel movements a week.   Straining to have a bowel movement.   Having hard, dry, or larger than normal stools.   Feeling full or bloated.   Pain in the lower abdomen.  Not feeling relief after having a bowel movement. DIAGNOSIS  Your caregiver will take a medical history and perform a physical exam. Further testing may be done for severe constipation. Some tests may include:   A barium enema X-ray to examine your rectum, colon, and sometimes, your small intestine.  A sigmoidoscopy to examine your lower colon.  A colonoscopy  to examine your entire colon. TREATMENT  Treatment will depend on the severity of your constipation and what is causing it. Some dietary treatments include drinking more fluids and eating more fiber-rich foods. Lifestyle treatments may include regular exercise. If these diet and lifestyle recommendations do not help, your caregiver may recommend taking over-the-counter laxative medicines to help you have bowel movements. Prescription medicines may be prescribed if over-the-counter medicines do not work.  HOME CARE INSTRUCTIONS   Increase dietary fiber in your diet, such as fruits, vegetables, whole grains, and beans. Limit high-fat and processed sugars in your diet, such as [place] fries, hamburgers, cookies, candies, and soda.   A fiber supplement may be added to your diet if you cannot get enough fiber from foods.   Drink enough fluids to keep your urine clear or pale yellow.   Exercise regularly or as directed by your caregiver.   Go to the restroom when you have the urge to go. Do not hold it.  Only take medicines as directed by your caregiver. Do not take other medicines for constipation without talking to your caregiver first. SEEK IMMEDIATE MEDICAL CARE IF:   You have bright red blood in your stool.   Your constipation lasts for more than 4 days or gets worse.   You have abdominal or rectal pain.   You have thin, pencil-like stools.  You have unexplained weight loss. MAKE SURE YOU:   Understand these instructions.  Will watch your condition.  Will get help right away if  you are not doing well or get worse. Document Released: 08/16/2004 Document Revised: 02/10/2012 Document Reviewed: 10/22/2011 [facility] Patient Information 2013 [place], [place].

## 2012-12-27 ENCOUNTER — Encounter: Payer: Self-pay | Admitting: Family Medicine

## 2012-12-27 NOTE — Progress Notes (Signed)
Patient ID: Elijah Marquez, male   DOB: Apr 13, 1971, 42 y.o.   MRN: [id number] Elijah Marquez [id number] Sep 24, 1971 12/27/2012      Progress Note-Follow Up  Subjective  Chief Complaint  Chief Complaint  Patient presents with  . Follow-up    hospital    HPI  Patient is a 42 year old Caucasian male who is in today for followup. He had surgical correction of his neck on January 7 and is doing much better. Does note some persistent symptoms of his left arm which are slightly worse but notes his neck pain is asked improved. No headaches. No new. Seizures. No chest pain, palpitations, shortness of breath, GI or GU complaints. He does believe the combination of citalopram and alprazolam have helped him to negotiate this very stressful period  Past Medical History  Diagnosis Date  . Chicken pox as a child  . Osteoarthritis of left knee     and right knee  . Tobacco abuse   . Hyperlipidemia   . Neck pain on left side 08/23/2012  . Anxiety 08/23/2012  . Hyperlipidemia 08/23/2012  . Fatigue   . Anxiety as acute reaction to exceptional stress 09/20/2012    Past Surgical History  Procedure Date  . Knee surgery     right knee 2012, left knee 2011 (arthroscopy)  . Wisdom tooth extraction     Family History  Problem Relation Age of Onset  . Fibromyalgia Mother   . Coronary artery disease Mother   . Hypertension Mother   . Heart attack Mother   . Fibromyalgia Sister   . Hypertension Sister   . Heart disease Sister   . Other Son     mildly mentally handicap  . Seizures Son   . Lupus Sister   . Emphysema Sister     smoker    History   Social History  . Marital Status: Married    Spouse Name: N/A    Number of Children: N/A  . Years of Education: N/A   Occupational History  . Not on file.   Social History Main Topics  . Smoking status: Current Every Day Smoker -- 1.5 packs/day for 25 years    Types: Cigarettes  . Smokeless tobacco: Never Used  . Alcohol Use: No  . Drug  Use: No  . Sexually Active: Yes -- Male partner(s)   Other Topics Concern  . Not on file   Social History Narrative  . No narrative on file    Current Outpatient Prescriptions on File Prior to Visit  Medication Sig Dispense Refill  . ALPRAZolam (XANAX) 1 MG tablet Take 1 tablet (1 mg total) by mouth 2 (two) times daily as needed for sleep or anxiety.  40 tablet  1  . citalopram (CELEXA) 20 MG tablet Take 1 tablet (20 mg total) by mouth daily.  30 tablet  1  . Diclofenac Sodium 3 % GEL Transdermal topical Advanced sports cream with neuropathic component: Diclofenac, Cyclobenzaprine, baclofen, Bupivacaine 1-2 gram qid prn pain      . naproxen (NAPROSYN) 500 MG tablet Take 1 tablet (500 mg total) by mouth 2 (two) times daily with a meal.  60 tablet  1    Allergies  Allergen Reactions  . Penicillins Swelling and Rash    Review of Systems  Review of Systems  Constitutional: Negative for fever and malaise/fatigue.  HENT: Positive for neck pain. Negative for congestion.   Eyes: Negative for discharge.  Respiratory: Negative for shortness of breath.   Cardiovascular: Negative  for chest pain, palpitations and leg swelling.  Gastrointestinal: Negative for nausea, abdominal pain and diarrhea.  Genitourinary: Negative for dysuria.  Musculoskeletal: Positive for joint pain. Negative for falls.       Lue pain  Skin: Negative for rash.  Neurological: Negative for loss of consciousness and headaches.  Endo/Heme/Allergies: Negative for polydipsia.  Psychiatric/Behavioral: Negative for depression and suicidal ideas. The patient is not nervous/anxious and does not have insomnia.     Objective  BP 140/82  Pulse 90  Temp 98.1 F (36.7 C) (Oral)  Ht 5\' 10"  (1.778 m)  Wt 178 lb 0.6 oz (80.758 kg)  BMI 25.55 kg/m2  SpO2 96%  Physical Exam  Physical Exam  Constitutional: He is oriented to person, place, and time and well-developed, well-nourished, and in no distress. No distress.    HENT:  Head: Normocephalic and atraumatic.  Eyes: Conjunctivae normal are normal.  Neck: Neck supple. No thyromegaly present.       Scar base of anterior neck healing well.  Cardiovascular: Normal rate, regular rhythm and normal heart sounds.   No murmur heard. Pulmonary/Chest: Effort normal and breath sounds normal. No respiratory distress.  Abdominal: He exhibits no distension and no mass. There is no tenderness.  Musculoskeletal: He exhibits no edema.  Neurological: He is alert and oriented to person, place, and time.  Skin: Skin is warm.  Psychiatric: Memory, affect and judgment normal.    Lab Results  Component Value Date   TSH 0.31* 08/19/2012   Lab Results  Component Value Date   WBC 12.8* 12/21/2012   HGB 15.6 12/21/2012   HCT 43.8 12/21/2012   MCV 86.4 12/21/2012   PLT 277 12/21/2012   Lab Results  Component Value Date   CREATININE 0.9 08/19/2012   BUN 8 08/19/2012   NA 140 08/19/2012   K 4.2 08/19/2012   CL 103 08/19/2012   CO2 31 08/19/2012   Lab Results  Component Value Date   ALT 42 08/19/2012   AST 29 08/19/2012   ALKPHOS 75 08/19/2012   BILITOT 0.4 08/19/2012   Lab Results  Component Value Date   CHOL 213* 08/19/2012   Lab Results  Component Value Date   HDL 27.70* 08/19/2012   No results found for this basename: [facility]   Lab Results  Component Value Date   TRIG 319.0* 08/19/2012   Lab Results  Component Value Date   CHOLHDL 8 08/19/2012     Assessment & Plan  Neck pain on left side Underwent surgical correction of his on 12/08/2012. He continues to struggle with pain but does acknowledge it is improved somewhat. He does have some slightly different pain down his left arm but the worst pain in his neck is actually resolved. Continue current meds and follow closely with neurosurgery for now.  Anxiety as acute reaction to exceptional stress Tolerating citalopram and alprazolam. Continue use of both for now use alprazolam as little as possible.

## 2012-12-27 NOTE — Assessment & Plan Note (Signed)
Tolerating citalopram and alprazolam. Continue use of both for now use alprazolam as little as possible.

## 2012-12-27 NOTE — Assessment & Plan Note (Signed)
Underwent surgical correction of his on 12/08/2012. He continues to struggle with pain but does acknowledge it is improved somewhat. He does have some slightly different pain down his left arm but the worst pain in his neck is actually resolved. Continue current meds and follow closely with neurosurgery for now.

## 2013-03-12 ENCOUNTER — Telehealth: Payer: Self-pay

## 2013-03-12 MED ORDER — OXYCODONE HCL 15 MG PO TABS: 15.0000 mg | ORAL_TABLET | Freq: Four times a day (QID) | ORAL | Status: DC | PRN

## 2013-03-12 NOTE — Telephone Encounter (Signed)
Pt left a message that he doesn't see his surgeon again until June 9th and would like a refill on his Oxy? Pt would like to know if you will refill this for him? Please advise?

## 2013-03-12 NOTE — Telephone Encounter (Signed)
OK to provide the Oxycodone

## 2013-03-22 ENCOUNTER — Ambulatory Visit (INDEPENDENT_AMBULATORY_CARE_PROVIDER_SITE_OTHER): Payer: BC Managed Care – PPO | Admitting: Family Medicine

## 2013-03-22 ENCOUNTER — Encounter: Payer: Self-pay | Admitting: Family Medicine

## 2013-03-22 VITALS — BP 124/88 | HR 72 | Temp 98.2°F | Ht 70.0 in | Wt 169.2 lb

## 2013-03-22 DIAGNOSIS — R4589 Other symptoms and signs involving emotional state: Secondary | ICD-10-CM

## 2013-03-22 DIAGNOSIS — Z720 Tobacco use: Secondary | ICD-10-CM

## 2013-03-22 DIAGNOSIS — R7989 Other specified abnormal findings of blood chemistry: Secondary | ICD-10-CM

## 2013-03-22 DIAGNOSIS — B349 Viral infection, unspecified: Secondary | ICD-10-CM

## 2013-03-22 DIAGNOSIS — R946 Abnormal results of thyroid function studies: Secondary | ICD-10-CM

## 2013-03-22 DIAGNOSIS — F411 Generalized anxiety disorder: Secondary | ICD-10-CM

## 2013-03-22 DIAGNOSIS — M542 Cervicalgia: Secondary | ICD-10-CM

## 2013-03-22 DIAGNOSIS — E7800 Pure hypercholesterolemia, unspecified: Secondary | ICD-10-CM

## 2013-03-22 DIAGNOSIS — B9789 Other viral agents as the cause of diseases classified elsewhere: Secondary | ICD-10-CM

## 2013-03-22 DIAGNOSIS — F17200 Nicotine dependence, unspecified, uncomplicated: Secondary | ICD-10-CM

## 2013-03-22 DIAGNOSIS — E785 Hyperlipidemia, unspecified: Secondary | ICD-10-CM

## 2013-03-22 LAB — LIPID PANEL
Cholesterol: 155 mg/dL (ref 0–200)
HDL: 25 mg/dL — ABNORMAL LOW (ref >39)
Total CHOL/HDL Ratio: 6.2 Ratio
Triglycerides: 189 mg/dL — ABNORMAL HIGH (ref <150)

## 2013-03-22 LAB — RENAL FUNCTION PANEL
Albumin: 4.3 g/dL (ref 3.5–5.2)
Calcium: 9.4 mg/dL (ref 8.4–10.5)
Creat: 0.82 mg/dL (ref 0.50–1.35)
Glucose, Bld: 84 mg/dL (ref 70–99)
Sodium: 139 mEq/L (ref 135–145)

## 2013-03-22 LAB — HEPATIC FUNCTION PANEL
ALT: 20 U/L (ref 0–53)
Albumin: 4.3 g/dL (ref 3.5–5.2)
Bilirubin, Direct: 0.1 mg/dL (ref 0.0–0.3)
Total Protein: 7.2 g/dL (ref 6.0–8.3)

## 2013-03-22 LAB — CBC
Platelets: 187 10*3/uL (ref 150–400)
RBC: 5 MIL/uL (ref 4.22–5.81)
RDW: 13.2 % (ref 11.5–15.5)
WBC: 7.3 10*3/uL (ref 4.0–10.5)

## 2013-03-22 NOTE — Patient Instructions (Addendum)
Start a krill oil cap daily Consider a probiotic such as Digestive Advantage or a generic daily  Needs a hearing screen at the next visit.  Hypercholesterolemia High Blood Cholesterol Cholesterol is a white, waxy, fat-like protein needed by your body in small amounts. The liver makes all the cholesterol you need. It is carried from the liver by the blood through the blood vessels. Deposits (plaque) may build up on blood vessel walls. This makes the arteries narrower and stiffer. Plaque increases the risk for heart attack and stroke. You cannot feel your cholesterol level even if it is very high. The only way to know is by a blood test to check your lipid (fats) levels. Once you know your cholesterol levels, you should keep a record of the test results. Work with your caregiver to to keep your levels in the desired range. WHAT THE RESULTS MEAN:  Total cholesterol is a rough measure of all the cholesterol in your blood.  LDL is the so-called bad cholesterol. This is the type that deposits cholesterol in the walls of the arteries. You want this level to be low.  HDL is the good cholesterol because it cleans the arteries and carries the LDL away. You want this level to be high.  Triglycerides are fat that the body can either burn for energy or store. High levels are closely linked to heart disease. DESIRED LEVELS:  Total cholesterol below 200.  LDL below 100 for people at risk, below 70 for very high risk.  HDL above 50 is good, above 60 is best.  Triglycerides below 150. HOW TO LOWER YOUR CHOLESTEROL:  Diet.  Choose fish or white meat chicken and [place], roasted or baked. Limit fatty cuts of red meat, fried foods, and processed meats, such as sausage and lunch meat.  Eat lots of fresh fruits and vegetables. Choose whole grains, beans, pasta, potatoes and cereals.  Use only small amounts of olive, corn or canola oils. Avoid butter, mayonnaise, shortening or palm kernel oils. Avoid  foods with trans-fats.  Use skim/nonfat milk and low-fat/nonfat yogurt and cheeses. Avoid whole milk, cream, ice cream, egg yolks and cheeses. Healthy desserts include angel food cake, gingersnaps, animal crackers, hard candy, popsicles, and low-fat/nonfat frozen yogurt. Avoid pastries, cakes, pies and cookies.  Exercise.  A regular program helps decrease LDL and raises HDL.  Helps with weight control.  Do things that increase your activity level like gardening, walking, or taking the stairs.  Medication.  May be prescribed by your caregiver to help lowering cholesterol and the risk for heart disease.  You may need medicine even if your levels are normal if you have several risk factors. HOME CARE INSTRUCTIONS   Follow your diet and exercise programs as suggested by your caregiver.  Take medications as directed.  Have blood work done when your caregiver feels it is necessary. MAKE SURE YOU:   Understand these instructions.  Will watch your condition.  Will get help right away if you are not doing well or get worse. Document Released: 11/18/2005 Document Revised: 02/10/2012 Document Reviewed: 05/06/2007 [facility] Patient Information 2013 [place], [place].

## 2013-03-24 NOTE — Assessment & Plan Note (Signed)
Encouraged complete cessation. 

## 2013-03-24 NOTE — Progress Notes (Signed)
Patient ID: Elijah Marquez, male   DOB: 1971/02/10, 42 y.o.   MRN: [id number] Elijah Marquez [id number] [birth year] 03/24/2013      Progress Note-Follow Up  Subjective  Chief Complaint  Chief Complaint  Patient presents with  . Follow-up    3 months    HPI  Patient is a 42 year old Caucasian male who is here today and for follow up. He is doing much better since his surgery with Dr. Jeral Fruit. Still has some neck pain and left shoulder pain but it is improved. Still has some mild burning in his left neck but greatly improved. The numbness and weakness in his arm is better. No new concerns. Due to a recent cold but feels that is resolved. No headache, chest pain palpitations, shortness of breath, GI or GU complaints.  Past Medical History  Diagnosis Date  . Chicken pox as a child  . Osteoarthritis of left knee     and right knee  . Tobacco abuse   . Hyperlipidemia   . Neck pain on left side 08/23/2012  . Anxiety 08/23/2012  . Hyperlipidemia 08/23/2012  . Fatigue   . Anxiety as acute reaction to exceptional stress 09/20/2012    Past Surgical History  Procedure Laterality Date  . Knee surgery      right knee 2012, left knee 2011 (arthroscopy)  . Wisdom tooth extraction    . Neck surgery  12/08/12    Family History  Problem Relation Age of Onset  . Fibromyalgia Mother   . Coronary artery disease Mother   . Hypertension Mother   . Heart attack Mother   . Fibromyalgia Sister   . Hypertension Sister   . Heart disease Sister   . Other Son     mildly mentally handicap  . Seizures Son   . Lupus Sister   . Emphysema Sister     smoker    History   Social History  . Marital Status: Married    Spouse Name: N/A    Number of Children: N/A  . Years of Education: N/A   Occupational History  . Not on file.   Social History Main Topics  . Smoking status: Current Every Day Smoker -- 1.50 packs/day for 25 years    Types: Cigarettes  . Smokeless tobacco: Never Used  . Alcohol  Use: No  . Drug Use: No  . Sexually Active: Yes -- Male partner(s)   Other Topics Concern  . Not on file   Social History Narrative  . No narrative on file    Current Outpatient Prescriptions on File Prior to Visit  Medication Sig Dispense Refill  . ALPRAZolam (XANAX) 1 MG tablet Take 1 tablet (1 mg total) by mouth 2 (two) times daily as needed for sleep or anxiety.  40 tablet  1  . Diazepam (VALIUM PO) Take by mouth.      . naproxen (NAPROSYN) 500 MG tablet Take 1 tablet (500 mg total) by mouth 2 (two) times daily with a meal.  60 tablet  1  . oxyCODONE (ROXICODONE) 15 MG immediate release tablet Take 1 tablet (15 mg total) by mouth every 6 (six) hours as needed.  60 tablet  0  . citalopram (CELEXA) 20 MG tablet Take 1 tablet (20 mg total) by mouth daily.  30 tablet  1  . Diclofenac Sodium 3 % GEL Transdermal topical Advanced sports cream with neuropathic component: Diclofenac, Cyclobenzaprine, baclofen, Bupivacaine 1-2 gram qid prn pain       No current  facility-administered medications on file prior to visit.    Allergies  Allergen Reactions  . Penicillins Swelling and Rash    Review of Systems  Review of Systems  Constitutional: Negative for fever and malaise/fatigue.  HENT: Positive for neck pain. Negative for congestion.   Eyes: Negative for discharge.  Respiratory: Negative for shortness of breath.   Cardiovascular: Negative for chest pain, palpitations and leg swelling.  Gastrointestinal: Negative for nausea, abdominal pain and diarrhea.  Genitourinary: Negative for dysuria.  Musculoskeletal: Negative for falls.  Skin: Negative for rash.  Neurological: Negative for loss of consciousness and headaches.  Endo/Heme/Allergies: Negative for polydipsia.  Psychiatric/Behavioral: Negative for depression and suicidal ideas. The patient is not nervous/anxious and does not have insomnia.     Objective  BP 124/88  Pulse 72  Temp(Src) 98.2 F (36.8 C) (Oral)  Ht 5'  10" (1.778 m)  Wt 169 lb 4 oz (76.771 kg)  BMI 24.28 kg/m2  SpO2 96%  Physical Exam  Physical Exam  Constitutional: He is oriented to person, place, and time and well-developed, well-nourished, and in no distress. No distress.  HENT:  Head: Normocephalic and atraumatic.  Eyes: Conjunctivae are normal.  Neck: Neck supple. No thyromegaly present.  Cardiovascular: Normal rate, regular rhythm and normal heart sounds.   No murmur heard. Pulmonary/Chest: Effort normal and breath sounds normal. No respiratory distress.  Abdominal: He exhibits no distension and no mass. There is no tenderness.  Musculoskeletal: He exhibits no edema.  Neurological: He is alert and oriented to person, place, and time.  Skin: Skin is warm.  Psychiatric: Memory, affect and judgment normal.    Lab Results  Component Value Date   TSH 2.244 03/22/2013   Lab Results  Component Value Date   WBC 7.3 03/22/2013   HGB 15.3 03/22/2013   HCT 42.0 03/22/2013   MCV 84.0 03/22/2013   PLT 187 03/22/2013   Lab Results  Component Value Date   CREATININE 0.82 03/22/2013   BUN 11 03/22/2013   NA 139 03/22/2013   K 3.9 03/22/2013   CL 99 03/22/2013   CO2 31 03/22/2013   Lab Results  Component Value Date   ALT 20 03/22/2013   AST 16 03/22/2013   ALKPHOS 80 03/22/2013   BILITOT 0.4 03/22/2013   Lab Results  Component Value Date   CHOL 155 03/22/2013   Lab Results  Component Value Date   HDL 25* 03/22/2013   Lab Results  Component Value Date   LDLCALC 92 03/22/2013   Lab Results  Component Value Date   TRIG 189* 03/22/2013   Lab Results  Component Value Date   CHOLHDL 6.2 03/22/2013     Assessment & Plan  Neck pain on left side Doing better since her surgery. Given refill on Oxycodone to use prn has appt for follow up with surgeon soon.  Tobacco abuse Encouraged complete cessation  Anxiety as acute reaction to exceptional stress Doing better as his pain improves. Continue current  meds  Hyperlipidemia Mild, avoid trans fats, continue krill oil and continue to monitor

## 2013-03-24 NOTE — Assessment & Plan Note (Signed)
Mild, avoid trans fats, continue krill oil and continue to monitor

## 2013-03-24 NOTE — Assessment & Plan Note (Signed)
Doing better since her surgery. Given refill on Oxycodone to use prn has appt for follow up with surgeon soon.

## 2013-03-24 NOTE — Assessment & Plan Note (Signed)
Doing better as his pain improves. Continue current meds

## 2013-06-21 ENCOUNTER — Ambulatory Visit (INDEPENDENT_AMBULATORY_CARE_PROVIDER_SITE_OTHER): Payer: BC Managed Care – PPO | Admitting: Family Medicine

## 2013-06-21 ENCOUNTER — Encounter: Payer: Self-pay | Admitting: Family Medicine

## 2013-06-21 VITALS — BP 138/72 | HR 73 | Temp 98.2°F | Ht 70.0 in | Wt 174.0 lb

## 2013-06-21 DIAGNOSIS — F17200 Nicotine dependence, unspecified, uncomplicated: Secondary | ICD-10-CM

## 2013-06-21 DIAGNOSIS — E785 Hyperlipidemia, unspecified: Secondary | ICD-10-CM

## 2013-06-21 DIAGNOSIS — Z720 Tobacco use: Secondary | ICD-10-CM

## 2013-06-21 DIAGNOSIS — F329 Major depressive disorder, single episode, unspecified: Secondary | ICD-10-CM

## 2013-06-21 DIAGNOSIS — F341 Dysthymic disorder: Secondary | ICD-10-CM

## 2013-06-21 DIAGNOSIS — M542 Cervicalgia: Secondary | ICD-10-CM

## 2013-06-21 DIAGNOSIS — F32A Depression, unspecified: Secondary | ICD-10-CM

## 2013-06-21 MED ORDER — ALPRAZOLAM 1 MG PO TABS: 1.0000 mg | ORAL_TABLET | Freq: Two times a day (BID) | ORAL | Status: DC | PRN

## 2013-06-21 NOTE — Patient Instructions (Addendum)
Mega[employer] krill oil caps daily Labs prior to next visit, lipid, renal, cbc, tsh, hepatic   Cholesterol Cholesterol is a white, waxy, fat-like protein needed by your body in small amounts. The liver makes all the cholesterol you need. It is carried from the liver by the blood through the blood vessels. Deposits (plaque) may build up on blood vessel walls. This makes the arteries narrower and stiffer. Plaque increases the risk for heart attack and stroke. You cannot feel your cholesterol level even if it is very high. The only way to know is by a blood test to check your lipid (fats) levels. Once you know your cholesterol levels, you should keep a record of the test results. Work with your caregiver to to keep your levels in the desired range. WHAT THE RESULTS MEAN:  Total cholesterol is a rough measure of all the cholesterol in your blood.  LDL is the so-called bad cholesterol. This is the type that deposits cholesterol in the walls of the arteries. You want this level to be low.  HDL is the good cholesterol because it cleans the arteries and carries the LDL away. You want this level to be high.  Triglycerides are fat that the body can either burn for energy or store. High levels are closely linked to heart disease. DESIRED LEVELS:  Total cholesterol below 200.  LDL below 100 for people at risk, below 70 for very high risk.  HDL above 50 is good, above 60 is best.  Triglycerides below 150. HOW TO LOWER YOUR CHOLESTEROL:  Diet.  Choose fish or white meat chicken and [place], roasted or baked. Limit fatty cuts of red meat, fried foods, and processed meats, such as sausage and lunch meat.  Eat lots of fresh fruits and vegetables. Choose whole grains, beans, pasta, potatoes and cereals.  Use only small amounts of olive, corn or canola oils. Avoid butter, mayonnaise, shortening or palm kernel oils. Avoid foods with trans-fats.  Use skim/nonfat milk and low-fat/nonfat yogurt and cheeses.  Avoid whole milk, cream, ice cream, egg yolks and cheeses. Healthy desserts include angel food cake, ginger snaps, animal crackers, hard candy, popsicles, and low-fat/nonfat frozen yogurt. Avoid pastries, cakes, pies and cookies.  Exercise.  A regular program helps decrease LDL and raises HDL.  Helps with weight control.  Do things that increase your activity level like gardening, walking, or taking the stairs.  Medication.  May be prescribed by your caregiver to help lowering cholesterol and the risk for heart disease.  You may need medicine even if your levels are normal if you have several risk factors. HOME CARE INSTRUCTIONS   Follow your diet and exercise programs as suggested by your caregiver.  Take medications as directed.  Have blood work done when your caregiver feels it is necessary. MAKE SURE YOU:   Understand these instructions.  Will watch your condition.  Will get help right away if you are not doing well or get worse. Document Released: 08/13/2001 Document Revised: 02/10/2012 Document Reviewed: 02/03/2008 [facility] Patient Information 2014 [place], [place].

## 2013-06-23 NOTE — Assessment & Plan Note (Signed)
Unfortunately he is still smoking and does not feel he wants to try quitting now. Encouraged to consider quitting even with an Photographer

## 2013-06-23 NOTE — Assessment & Plan Note (Signed)
Mild, avoid trans fats, encouraged krill oil caps and attempt modest exercise

## 2013-06-23 NOTE — Assessment & Plan Note (Signed)
Patient continues to struggle with daily pain but is back to work and managing his pain with minimal meds

## 2013-06-23 NOTE — Progress Notes (Signed)
Patient ID: Elijah Marquez, male   DOB: [birth year], 42 y.o.   MRN: [id number] Elijah Marquez [id number] 07/08/1971 06/23/2013      Progress Note-Follow Up  Subjective  Chief Complaint  Chief Complaint  Patient presents with  . Follow-up    3 month    HPI  Patient is a 42 year old Caucasian male who is in today for followup. No recent illness. No new concerns. Unfortunately does continue to have trouble with chronic neck and back pain status post his injury. It is better since the surgery but pain still exists. Takes minimal meds. He continues to smoke unfortunately as much as 2 packs per day and declines effort to try and quit. Continues to work. Does feel the Celexa helps him manage the stress of his current situation fairly well. No chest pain, palpitations, shortness of breath, GI or GU concerns otherwise noted today.  Past Medical History  Diagnosis Date  . Chicken pox as a child  . Osteoarthritis of left knee     and right knee  . Tobacco abuse   . Hyperlipidemia   . Neck pain on left side 08/23/2012  . Anxiety 08/23/2012  . Hyperlipidemia 08/23/2012  . Fatigue   . Anxiety as acute reaction to exceptional stress 09/20/2012    Past Surgical History  Procedure Laterality Date  . Knee surgery      right knee 2012, left knee 2011 (arthroscopy)  . Wisdom tooth extraction    . Neck surgery  12/08/12    Family History  Problem Relation Age of Onset  . Fibromyalgia Mother   . Coronary artery disease Mother   . Hypertension Mother   . Heart attack Mother   . Fibromyalgia Sister   . Hypertension Sister   . Heart disease Sister   . Other Son     mildly mentally handicap  . Seizures Son   . Lupus Sister   . Emphysema Sister     smoker    History   Social History  . Marital Status: Married    Spouse Name: N/A    Number of Children: N/A  . Years of Education: N/A   Occupational History  . Not on file.   Social History Main Topics  . Smoking status: Current Every  Day Smoker -- 1.50 packs/day for 25 years    Types: Cigarettes  . Smokeless tobacco: Never Used  . Alcohol Use: No  . Drug Use: No  . Sexually Active: Yes -- Male partner(s)   Other Topics Concern  . Not on file   Social History Narrative  . No narrative on file    Current Outpatient Prescriptions on File Prior to Visit  Medication Sig Dispense Refill  . Diazepam (VALIUM PO) Take by mouth. Prn      . naproxen (NAPROSYN) 500 MG tablet Take 1 tablet (500 mg total) by mouth 2 (two) times daily with a meal.  60 tablet  1   No current facility-administered medications on file prior to visit.    Allergies  Allergen Reactions  . Penicillins Swelling and Rash    Review of Systems  Review of Systems  Constitutional: Negative for fever and malaise/fatigue.  HENT: Positive for neck pain. Negative for congestion.   Eyes: Negative for pain and discharge.  Respiratory: Negative for shortness of breath.   Cardiovascular: Negative for chest pain, palpitations and leg swelling.  Gastrointestinal: Negative for nausea, abdominal pain and diarrhea.  Genitourinary: Negative for dysuria.  Musculoskeletal: Positive for back pain.  Negative for falls.  Skin: Negative for rash.  Neurological: Negative for loss of consciousness and headaches.  Endo/Heme/Allergies: Negative for polydipsia.  Psychiatric/Behavioral: Negative for depression and suicidal ideas. The patient is nervous/anxious. The patient does not have insomnia.     Objective  BP 138/72  Pulse 73  Temp(Src) 98.2 F (36.8 C) (Oral)  Ht 5\' 10"  (1.778 m)  Wt 174 lb (78.926 kg)  BMI 24.97 kg/m2  SpO2 97%  Physical Exam  Physical Exam  Constitutional: He is oriented to person, place, and time and well-developed, well-nourished, and in no distress. No distress.  HENT:  Head: Normocephalic and atraumatic.  Eyes: Conjunctivae are normal.  Neck: Neck supple. No thyromegaly present.  Cardiovascular: Normal rate, regular rhythm  and normal heart sounds.  Exam reveals no gallop.   No murmur heard. Pulmonary/Chest: Effort normal and breath sounds normal. No respiratory distress.  Abdominal: He exhibits no distension and no mass. There is no tenderness.  Musculoskeletal: He exhibits no edema.  Neurological: He is alert and oriented to person, place, and time.  Skin: Skin is warm.  Psychiatric: Memory, affect and judgment normal.    Lab Results  Component Value Date   TSH 2.244 03/22/2013   Lab Results  Component Value Date   WBC 7.3 03/22/2013   HGB 15.3 03/22/2013   HCT 42.0 03/22/2013   MCV 84.0 03/22/2013   PLT 187 03/22/2013   Lab Results  Component Value Date   CREATININE 0.82 03/22/2013   BUN 11 03/22/2013   NA 139 03/22/2013   K 3.9 03/22/2013   CL 99 03/22/2013   CO2 31 03/22/2013   Lab Results  Component Value Date   ALT 20 03/22/2013   AST 16 03/22/2013   ALKPHOS 80 03/22/2013   BILITOT 0.4 03/22/2013   Lab Results  Component Value Date   CHOL 155 03/22/2013   Lab Results  Component Value Date   HDL 25* 03/22/2013   Lab Results  Component Value Date   LDLCALC 92 03/22/2013   Lab Results  Component Value Date   TRIG 189* 03/22/2013   Lab Results  Component Value Date   CHOLHDL 6.2 03/22/2013     Assessment & Plan  Neck pain on left side Patient continues to struggle with daily pain but is back to work and managing his pain with minimal meds  Tobacco abuse Unfortunately he is still smoking and does not feel he wants to try quitting now. Encouraged to consider quitting even with an Ecig  Hyperlipidemia Mild, avoid trans fats, encouraged krill oil caps and attempt modest exercise

## 2013-07-29 ENCOUNTER — Ambulatory Visit (INDEPENDENT_AMBULATORY_CARE_PROVIDER_SITE_OTHER): Payer: BC Managed Care – PPO | Admitting: Physician Assistant

## 2013-07-29 ENCOUNTER — Encounter: Payer: Self-pay | Admitting: Physician Assistant

## 2013-07-29 VITALS — BP 130/82 | HR 75 | Temp 98.0°F | Resp 18 | Ht 70.0 in | Wt 176.5 lb

## 2013-07-29 DIAGNOSIS — M545 Low back pain, unspecified: Secondary | ICD-10-CM

## 2013-07-29 DIAGNOSIS — G8929 Other chronic pain: Secondary | ICD-10-CM | POA: Insufficient documentation

## 2013-07-29 DIAGNOSIS — M549 Dorsalgia, unspecified: Secondary | ICD-10-CM | POA: Insufficient documentation

## 2013-07-29 MED ORDER — CYCLOBENZAPRINE HCL 10 MG PO TABS: 10.0000 mg | ORAL_TABLET | Freq: Three times a day (TID) | ORAL | Status: DC | PRN

## 2013-07-29 MED ORDER — METHYLPREDNISOLONE (PAK) 4 MG PO TABS: ORAL_TABLET | ORAL | Status: DC

## 2013-07-29 NOTE — Assessment & Plan Note (Signed)
Rx Medrol dose pack.  Patient to stop Naproxen until steroid course is completed.  Rx Flexeril for muscle spasm.  Moist heat/Cold compresses to area.  Topical Aspercreme.  Avoid over exertion.  Activity as tolerated.  Call or return if symptoms persist or acutely worsen

## 2013-07-29 NOTE — Patient Instructions (Signed)
Please take steroids as directed on package (Medrol Dose Pack).  Please hold off on your naproxen until you have finished the steroids.  Take muscle relaxer (Flexeril) as prescribed. Apply moist heat to affected area.  Alternate with cold compresses.  Recommend topical Aspercreme or Salon Pas.    Back Pain, Adult Low back pain is very common. About 1 in 5 people have back pain.The cause of low back pain is rarely dangerous. The pain often gets better over time.About half of people with a sudden onset of back pain feel better in just 2 weeks. About 8 in 10 people feel better by 6 weeks.  CAUSES Some common causes of back pain include:  Strain of the muscles or ligaments supporting the spine.  Wear and tear (degeneration) of the spinal discs.  Arthritis.  Direct injury to the back. DIAGNOSIS Most of the time, the direct cause of low back pain is not known.However, back pain can be treated effectively even when the exact cause of the pain is unknown.Answering your caregiver's questions about your overall health and symptoms is one of the most accurate ways to make sure the cause of your pain is not dangerous. If your caregiver needs more information, he or she may order lab work or imaging tests (X-rays or MRIs).However, even if imaging tests show changes in your back, this usually does not require surgery. HOME CARE INSTRUCTIONS For many people, back pain returns.Since low back pain is rarely dangerous, it is often a condition that people can learn to [facility] their own.   Remain active. It is stressful on the back to sit or stand in one place. Do not sit, drive, or stand in one place for more than 30 minutes at a time. Take short walks on level surfaces as soon as pain allows.Try to increase the length of time you walk each day.  Do not stay in bed.Resting more than 1 or 2 days can delay your recovery.  Do not avoid exercise or work.Your body is made to move.It is not dangerous to be  active, even though your back may hurt.Your back will likely heal faster if you return to being active before your pain is gone.  Pay attention to your body when you bend and lift. Many people have less discomfortwhen lifting if they bend their knees, keep the load close to their bodies,and avoid twisting. Often, the most comfortable positions are those that put less stress on your recovering back.  Find a comfortable position to sleep. Use a firm mattress and lie on your side with your knees slightly bent. If you lie on your back, put a pillow under your knees.  Only take over-the-counter or prescription medicines as directed by your caregiver. Over-the-counter medicines to reduce pain and inflammation are often the most helpful.Your caregiver may prescribe muscle relaxant drugs.These medicines help dull your pain so you can more quickly return to your normal activities and healthy exercise.  Put ice on the injured area.  Put ice in a plastic bag.  Place a towel between your skin and the bag.  Leave the ice on for 15-20 minutes, 3-4 times a day for the first 2 to 3 days. After that, ice and heat may be alternated to reduce pain and spasms.  Ask your caregiver about trying back exercises and gentle massage. This may be of some benefit.  Avoid feeling anxious or stressed.Stress increases muscle tension and can worsen back pain.It is important to recognize when you are anxious or  stressed and learn ways to manage it.Exercise is a great option. SEEK MEDICAL CARE IF:  You have pain that is not relieved with rest or medicine.  You have pain that does not improve in 1 week.  You have new symptoms.  You are generally not feeling well. SEEK IMMEDIATE MEDICAL CARE IF:   You have pain that radiates from your back into your legs.  You develop new bowel or bladder control problems.  You have unusual weakness or numbness in your arms or legs.  You develop nausea or vomiting.  You  develop abdominal pain.  You feel faint. Document Released: 11/18/2005 Document Revised: 05/19/2012 Document Reviewed: 04/08/2011 [facility] Patient Information 2014 [place], [place].

## 2013-07-29 NOTE — Progress Notes (Signed)
Patient ID: Elijah Marquez, male   DOB: 1971-09-16, 42 y.o.   MRN: [id number]  Patient presents to clinic today c/o stabbing low back pain with stiffness for the past 3 days.  Information was obtained from the patient.  Patient denies trauma, hx of lower back pain, numbness, tingling, radiation of pain.  Denies saddle anesthesia, or change to bowel or bladder function.  Has not tried anything for pain.  Patient does engage in heavy lifting at work occasionally, but denies recent heavy lifting.  Patient has history of OA in bilateral knee joints.  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  Past Medical History  Diagnosis Date  . Chicken pox as a child  . Osteoarthritis of left knee     and right knee  . Tobacco abuse   . Hyperlipidemia   . Neck pain on left side 08/23/2012  . Anxiety 08/23/2012  . Hyperlipidemia 08/23/2012  . Fatigue   . Anxiety as acute reaction to exceptional stress 09/20/2012    Current Outpatient Prescriptions on File Prior to Visit  Medication Sig Dispense Refill  . ALPRAZolam (XANAX) 1 MG tablet Take 1 tablet (1 mg total) by mouth 2 (two) times daily as needed for sleep or anxiety.  40 tablet  2  . Diazepam (VALIUM PO) Take by mouth. Prn      . naproxen (NAPROSYN) 500 MG tablet Take 1 tablet (500 mg total) by mouth 2 (two) times daily with a meal.  60 tablet  1   No current facility-administered medications on file prior to visit.    Allergies  Allergen Reactions  . Penicillins Swelling and Rash    Family History  Problem Relation Age of Onset  . Fibromyalgia Mother   . Coronary artery disease Mother   . Hypertension Mother   . Heart attack Mother   . Fibromyalgia Sister   . Hypertension Sister   . Heart disease Sister   . Other Son     mildly mentally handicap  . Seizures Son   . Lupus Sister   . Emphysema Sister     smoker    History   Social History  . Marital Status: Married    Spouse Name: N/A    Number of Children: N/A  .  Years of Education: N/A   Social History Main Topics  . Smoking status: Current Every Day Smoker -- 1.50 packs/day for 25 years    Types: Cigarettes  . Smokeless tobacco: Never Used  . Alcohol Use: No  . Drug Use: No  . Sexual Activity: Yes    Partners: Female   Other Topics Concern  . None   Social History Narrative  . None   ROS    Filed Vitals:   07/29/13 1035  BP: 130/82  Pulse: 75  Temp: 98 F (36.7 C)  Resp: 18   Physical Exam  Vitals reviewed. Constitutional: He is oriented to person, place, and time.  Well-developed, well-nourished caucasian gentleman in mild painful distress, sitting on examination table  HENT:  Head: Normocephalic and atraumatic.  Neck: Normal range of motion. Neck supple.  Cardiovascular: Normal rate, regular rhythm, normal heart sounds and intact distal pulses.   Pulmonary/Chest: Effort normal and breath sounds normal.  Musculoskeletal:  Decreased range of motion of lumbar spine 2/2 pain.  No bony point tenderness noted.  Positive tenderness to palpation of L lumbosacral spinous musculature with muscle spasm.  Neurological: He is alert and oriented to person, place,  and time. He has normal reflexes. No cranial nerve deficit.  Skin: Skin is warm and dry. No rash noted.   Assessment/Plan: No problem-specific assessment & plan notes found for this encounter.

## 2013-09-20 ENCOUNTER — Ambulatory Visit (INDEPENDENT_AMBULATORY_CARE_PROVIDER_SITE_OTHER): Payer: BC Managed Care – PPO | Admitting: Family Medicine

## 2013-09-20 ENCOUNTER — Encounter: Payer: Self-pay | Admitting: Family Medicine

## 2013-09-20 VITALS — BP 120/80 | HR 78 | Temp 98.1°F | Ht 70.0 in | Wt 180.1 lb

## 2013-09-20 DIAGNOSIS — M545 Low back pain, unspecified: Secondary | ICD-10-CM

## 2013-09-20 DIAGNOSIS — F17200 Nicotine dependence, unspecified, uncomplicated: Secondary | ICD-10-CM

## 2013-09-20 DIAGNOSIS — M549 Dorsalgia, unspecified: Secondary | ICD-10-CM

## 2013-09-20 DIAGNOSIS — F411 Generalized anxiety disorder: Secondary | ICD-10-CM

## 2013-09-20 DIAGNOSIS — G4700 Insomnia, unspecified: Secondary | ICD-10-CM

## 2013-09-20 DIAGNOSIS — R4589 Other symptoms and signs involving emotional state: Secondary | ICD-10-CM

## 2013-09-20 DIAGNOSIS — Z79899 Other long term (current) drug therapy: Secondary | ICD-10-CM

## 2013-09-20 DIAGNOSIS — R3915 Urgency of urination: Secondary | ICD-10-CM

## 2013-09-20 DIAGNOSIS — Z720 Tobacco use: Secondary | ICD-10-CM

## 2013-09-20 MED ORDER — HYDROCODONE-ACETAMINOPHEN 5-325 MG PO TABS: 1.0000 | ORAL_TABLET | Freq: Three times a day (TID) | ORAL | Status: DC | PRN

## 2013-09-20 MED ORDER — CYCLOBENZAPRINE HCL 10 MG PO TABS: 10.0000 mg | ORAL_TABLET | Freq: Three times a day (TID) | ORAL | Status: DC | PRN

## 2013-09-20 MED ORDER — DIAZEPAM 10 MG PO TABS: 10.0000 mg | ORAL_TABLET | Freq: Two times a day (BID) | ORAL | Status: DC | PRN

## 2013-09-20 NOTE — Progress Notes (Signed)
Patient ID: Elijah Marquez, male   DOB: 1971-04-02, 42 y.o.   MRN: [id number] Elijah Marquez [id number] 19-May-1971 09/20/2013      Progress Note-Follow Up  Subjective  Chief Complaint  Chief Complaint  Patient presents with  . Follow-up    3 month    HPI  Patient is a 42 year old Caucasian male in today for followup. He continues to struggle with chronic pain but is still working full-time. Has ongoing neck and back pain. His knees have bothered him for a long time but if really been worse lately. No recent falls or injury. No redness or warmth. Denies any other new concerns and feels she's dealing with stress as well. No recent illness, chest pain, palpitations, shortness of breath, GI or GU complaints.  Past Medical History  Diagnosis Date  . Chicken pox as a child  . Osteoarthritis of left knee     and right knee  . Tobacco abuse   . Hyperlipidemia   . Neck pain on left side 08/23/2012  . Anxiety 08/23/2012  . Hyperlipidemia 08/23/2012  . Fatigue   . Anxiety as acute reaction to exceptional stress 09/20/2012    Past Surgical History  Procedure Laterality Date  . Knee surgery      right knee 2012, left knee 2011 (arthroscopy)  . Wisdom tooth extraction    . Neck surgery  12/08/12    Family History  Problem Relation Age of Onset  . Fibromyalgia Mother   . Coronary artery disease Mother   . Hypertension Mother   . Heart attack Mother   . Fibromyalgia Sister   . Hypertension Sister   . Heart disease Sister   . Other Son     mildly mentally handicap  . Seizures Son   . Lupus Sister   . Emphysema Sister     smoker    History   Social History  . Marital Status: Married    Spouse Name: N/A    Number of Children: N/A  . Years of Education: N/A   Occupational History  . Not on file.   Social History Main Topics  . Smoking status: Current Every Day Smoker -- 1.50 packs/day for 25 years    Types: Cigarettes  . Smokeless tobacco: Never Used  . Alcohol Use: No   . Drug Use: No  . Sexual Activity: Yes    Partners: Female   Other Topics Concern  . Not on file   Social History Narrative  . No narrative on file    Current Outpatient Prescriptions on File Prior to Visit  Medication Sig Dispense Refill  . ALPRAZolam (XANAX) 1 MG tablet Take 1 tablet (1 mg total) by mouth 2 (two) times daily as needed for sleep or anxiety.  40 tablet  2  . naproxen (NAPROSYN) 500 MG tablet Take 1 tablet (500 mg total) by mouth 2 (two) times daily with a meal.  60 tablet  1   No current facility-administered medications on file prior to visit.    Allergies  Allergen Reactions  . Penicillins Swelling and Rash    Review of Systems  Review of Systems  Constitutional: Negative for fever and malaise/fatigue.  HENT: Negative for congestion.   Eyes: Negative for discharge.  Respiratory: Negative for shortness of breath.   Cardiovascular: Negative for chest pain, palpitations and leg swelling.  Gastrointestinal: Negative for nausea, abdominal pain and diarrhea.  Genitourinary: Negative for dysuria.  Musculoskeletal: Positive for back pain, joint pain, myalgias and neck pain. Negative  for falls.  Skin: Negative for rash.  Neurological: Negative for loss of consciousness and headaches.  Endo/Heme/Allergies: Negative for polydipsia.  Psychiatric/Behavioral: Positive for depression. Negative for suicidal ideas. The patient is nervous/anxious and has insomnia.     Objective  BP 120/80  Pulse 78  Temp(Src) 98.1 F (36.7 C) (Oral)  Ht 5\' 10"  (1.778 m)  Wt 180 lb 1.3 oz (81.684 kg)  BMI 25.84 kg/m2  SpO2 96%  Physical Exam  Physical Exam  Constitutional: He is oriented to person, place, and time and well-developed, well-nourished, and in no distress. No distress.  HENT:  Head: Normocephalic and atraumatic.  Eyes: Conjunctivae are normal.  Neck: Neck supple. No thyromegaly present.  Cardiovascular: Normal rate, regular rhythm and normal heart sounds.    No murmur heard. Pulmonary/Chest: Effort normal and breath sounds normal. No respiratory distress.  Abdominal: He exhibits no distension and no mass. There is no tenderness.  Musculoskeletal: He exhibits no edema.  Neurological: He is alert and oriented to person, place, and time.  Skin: Skin is warm.  Psychiatric: Memory, affect and judgment normal.    Lab Results  Component Value Date   TSH 2.244 03/22/2013   Lab Results  Component Value Date   WBC 7.3 03/22/2013   HGB 15.3 03/22/2013   HCT 42.0 03/22/2013   MCV 84.0 03/22/2013   PLT 187 03/22/2013   Lab Results  Component Value Date   CREATININE 0.82 03/22/2013   BUN 11 03/22/2013   NA 139 03/22/2013   K 3.9 03/22/2013   CL 99 03/22/2013   CO2 31 03/22/2013   Lab Results  Component Value Date   ALT 20 03/22/2013   AST 16 03/22/2013   ALKPHOS 80 03/22/2013   BILITOT 0.4 03/22/2013   Lab Results  Component Value Date   CHOL 155 03/22/2013   Lab Results  Component Value Date   HDL 25* 03/22/2013   Lab Results  Component Value Date   LDLCALC 92 03/22/2013   Lab Results  Component Value Date   TRIG 189* 03/22/2013   Lab Results  Component Value Date   CHOLHDL 6.2 03/22/2013     Assessment & Plan  Low back pain And knee pain, may continue pain meds and he will discuss further options with his orthopaedist, Dr Zola Button  Tobacco abuse Encouraged cessation, patient still smoking more than a ppd. Discussed strategies for cessation.  Anxiety as acute reaction to exceptional stress Using Alprazolam prn. Feels he is doing somewhat better.

## 2013-09-20 NOTE — Patient Instructions (Signed)
Naproxen/Aleve=Ibuprofen/Motrin/Motrin=Goody Powder/Aspirin  Tylenol/Acetaminophen is different from the above, max of 3000mg /24hr (hydrocone/APAP is also Tylenol, 325 mg of APAP) Extra strength tylenol is 500 mg per tab  Salon Pas patch to low back just at bedtime

## 2013-09-22 NOTE — Assessment & Plan Note (Signed)
Encouraged cessation, patient still smoking more than a ppd. Discussed strategies for cessation.

## 2013-09-22 NOTE — Assessment & Plan Note (Signed)
And knee pain, may continue pain meds and he will discuss further options with his orthopaedist, Dr Zola Button

## 2013-09-22 NOTE — Assessment & Plan Note (Signed)
Using Alprazolam prn. Feels he is doing somewhat better.

## 2013-10-25 ENCOUNTER — Other Ambulatory Visit: Payer: Self-pay

## 2013-10-25 DIAGNOSIS — M549 Dorsalgia, unspecified: Secondary | ICD-10-CM

## 2013-10-25 MED ORDER — HYDROCODONE-ACETAMINOPHEN 5-325 MG PO TABS: 1.0000 | ORAL_TABLET | Freq: Three times a day (TID) | ORAL | Status: DC | PRN

## 2013-10-25 NOTE — Telephone Encounter (Signed)
Pt needs an RX for his Hydrocodone. Per MD ok to print RX   Message left on pts vm that RX is ready. RX at front desk

## 2013-11-29 ENCOUNTER — Telehealth: Payer: Self-pay | Admitting: Family Medicine

## 2013-11-29 DIAGNOSIS — M549 Dorsalgia, unspecified: Secondary | ICD-10-CM

## 2013-11-29 MED ORDER — HYDROCODONE-ACETAMINOPHEN 5-325 MG PO TABS: 1.0000 | ORAL_TABLET | Freq: Three times a day (TID) | ORAL | Status: DC | PRN

## 2013-11-29 NOTE — Telephone Encounter (Signed)
Last rx given 10/25/13, #60 x no refills. Rx printed and forwarded to Provider for signature.

## 2013-11-29 NOTE — Telephone Encounter (Signed)
HYDROCODONE REFILL  °

## 2013-11-29 NOTE — Telephone Encounter (Signed)
Pt informed and RX put at front desk

## 2013-12-21 ENCOUNTER — Ambulatory Visit: Payer: BC Managed Care – PPO | Admitting: Family Medicine

## 2013-12-30 ENCOUNTER — Ambulatory Visit (INDEPENDENT_AMBULATORY_CARE_PROVIDER_SITE_OTHER): Payer: BC Managed Care – PPO | Admitting: Family Medicine

## 2013-12-30 ENCOUNTER — Encounter: Payer: Self-pay | Admitting: Family Medicine

## 2013-12-30 VITALS — BP 122/70 | HR 81 | Temp 98.4°F | Ht 70.0 in | Wt 184.1 lb

## 2013-12-30 DIAGNOSIS — F17200 Nicotine dependence, unspecified, uncomplicated: Secondary | ICD-10-CM

## 2013-12-30 DIAGNOSIS — R4589 Other symptoms and signs involving emotional state: Secondary | ICD-10-CM

## 2013-12-30 DIAGNOSIS — F329 Major depressive disorder, single episode, unspecified: Secondary | ICD-10-CM

## 2013-12-30 DIAGNOSIS — F341 Dysthymic disorder: Secondary | ICD-10-CM

## 2013-12-30 DIAGNOSIS — M542 Cervicalgia: Secondary | ICD-10-CM

## 2013-12-30 DIAGNOSIS — R52 Pain, unspecified: Secondary | ICD-10-CM

## 2013-12-30 DIAGNOSIS — F430 Acute stress reaction: Secondary | ICD-10-CM

## 2013-12-30 DIAGNOSIS — F32A Depression, unspecified: Secondary | ICD-10-CM

## 2013-12-30 DIAGNOSIS — E785 Hyperlipidemia, unspecified: Secondary | ICD-10-CM

## 2013-12-30 DIAGNOSIS — F411 Generalized anxiety disorder: Secondary | ICD-10-CM

## 2013-12-30 DIAGNOSIS — F419 Anxiety disorder, unspecified: Secondary | ICD-10-CM

## 2013-12-30 DIAGNOSIS — Z720 Tobacco use: Secondary | ICD-10-CM

## 2013-12-30 MED ORDER — HYDROCODONE-ACETAMINOPHEN 7.5-325 MG PO TABS: 1.0000 | ORAL_TABLET | Freq: Three times a day (TID) | ORAL | Status: DC | PRN

## 2013-12-30 MED ORDER — ALPRAZOLAM 1 MG PO TABS: 1.0000 mg | ORAL_TABLET | Freq: Two times a day (BID) | ORAL | Status: DC | PRN

## 2013-12-30 NOTE — Patient Instructions (Signed)

## 2013-12-30 NOTE — Progress Notes (Signed)
Pre visit review using our clinic review tool, if applicable. No additional management support is needed unless otherwise documented below in the visit note. 

## 2013-12-31 ENCOUNTER — Telehealth: Payer: Self-pay | Admitting: Family Medicine

## 2013-12-31 NOTE — Telephone Encounter (Signed)
Relevant patient education assigned to patient using Emmi. ° °

## 2014-01-02 NOTE — Assessment & Plan Note (Signed)
Better s/p surgical decompression but still needs Flexeril, Hydrocodone and Naproxen to get through many days

## 2014-01-02 NOTE — Assessment & Plan Note (Signed)
Check lipids with next visit, avoid trans fats and simple carbs.

## 2014-01-02 NOTE — Progress Notes (Signed)
Patient ID: Elijah Marquez, male   DOB: 12-08-1970, 43 y.o.   MRN: [id number] Elijah Marquez [id number] [birth year] 01/02/2014      Progress Note-Follow Up  Subjective  Chief Complaint  Chief Complaint  Patient presents with  . Follow-up    3 month    HPI  Patient is a 43 year old Caucasian male who is in today for routine followup. Feeling well. Is in a slightly less stressful job now and feels his medications are helping. He needs the septum and is not really using Valium at this time. No recent injury but he continues to have neck pain and upper extremity pain status post his injury. Manages his pain relatively well with his medications and is able to continue working. Denies headache, chest pain or palpitations. Acknowledges some anxiety. Denies GI or GU complaints. He is interested in trying to quit smoking  Past Medical History  Diagnosis Date  . Chicken pox as a child  . Osteoarthritis of left knee     and right knee  . Tobacco abuse   . Hyperlipidemia   . Neck pain on left side 08/23/2012  . Anxiety 08/23/2012  . Hyperlipidemia 08/23/2012  . Fatigue   . Anxiety as acute reaction to exceptional stress 09/20/2012    Past Surgical History  Procedure Laterality Date  . Knee surgery      right knee 2012, left knee 2011 (arthroscopy)  . Wisdom tooth extraction    . Neck surgery  12/08/12    Family History  Problem Relation Age of Onset  . Fibromyalgia Mother   . Coronary artery disease Mother   . Hypertension Mother   . Heart attack Mother   . Fibromyalgia Sister   . Hypertension Sister   . Heart disease Sister   . Other Son     mildly mentally handicap  . Seizures Son   . Lupus Sister   . Emphysema Sister     smoker    History   Social History  . Marital Status: Married    Spouse Name: N/A    Number of Children: N/A  . Years of Education: N/A   Occupational History  . Not on file.   Social History Main Topics  . Smoking status: Current Every Day Smoker  -- 1.50 packs/day for 25 years    Types: Cigarettes  . Smokeless tobacco: Never Used  . Alcohol Use: No  . Drug Use: No  . Sexual Activity: Yes    Partners: Female   Other Topics Concern  . Not on file   Social History Narrative  . No narrative on file    Current Outpatient Prescriptions on File Prior to Visit  Medication Sig Dispense Refill  . cyclobenzaprine (FLEXERIL) 10 MG tablet Take 1 tablet (10 mg total) by mouth 3 (three) times daily as needed for muscle spasms.  30 tablet  2  . naproxen (NAPROSYN) 500 MG tablet Take 1 tablet (500 mg total) by mouth 2 (two) times daily with a meal.  60 tablet  1   No current facility-administered medications on file prior to visit.    Allergies  Allergen Reactions  . Penicillins Swelling and Rash    Review of Systems  Review of Systems  Constitutional: Negative for fever, chills and malaise/fatigue.  HENT: Negative for congestion, hearing loss and nosebleeds.   Eyes: Negative for discharge.  Respiratory: Negative for cough, sputum production, shortness of breath and wheezing.   Cardiovascular: Negative for chest pain, palpitations and leg swelling.  Gastrointestinal: Negative for heartburn, nausea, vomiting, abdominal pain, diarrhea, constipation and blood in stool.  Genitourinary: Negative for dysuria, urgency, frequency and hematuria.  Musculoskeletal: Positive for back pain and neck pain. Negative for falls and myalgias.  Skin: Negative for rash.  Neurological: Negative for dizziness, tremors, sensory change, focal weakness, loss of consciousness, weakness and headaches.  Endo/Heme/Allergies: Negative for polydipsia. Does not bruise/bleed easily.  Psychiatric/Behavioral: Negative for depression and suicidal ideas. The patient is nervous/anxious and has insomnia.     Objective  BP 122/70  Pulse 81  Temp(Src) 98.4 F (36.9 C) (Oral)  Ht 5\' 10"  (1.778 m)  Wt 184 lb 1.9 oz (83.516 kg)  BMI 26.42 kg/m2  SpO2 96%  Physical  Exam  Physical Exam  Constitutional: He is oriented to person, place, and time and well-developed, well-nourished, and in no distress. No distress.  HENT:  Head: Normocephalic and atraumatic.  Eyes: Conjunctivae are normal.  Neck: Neck supple. No thyromegaly present.  Cardiovascular: Normal rate, regular rhythm and normal heart sounds.   No murmur heard. Pulmonary/Chest: Effort normal and breath sounds normal. No respiratory distress.  Abdominal: He exhibits no distension and no mass. There is no tenderness.  Musculoskeletal: He exhibits no edema.  Neurological: He is alert and oriented to person, place, and time.  Skin: Skin is warm.  Psychiatric: Memory, affect and judgment normal.    Lab Results  Component Value Date   TSH 2.244 03/22/2013   Lab Results  Component Value Date   WBC 7.3 03/22/2013   HGB 15.3 03/22/2013   HCT 42.0 03/22/2013   MCV 84.0 03/22/2013   PLT 187 03/22/2013   Lab Results  Component Value Date   CREATININE 0.82 03/22/2013   BUN 11 03/22/2013   NA 139 03/22/2013   K 3.9 03/22/2013   CL 99 03/22/2013   CO2 31 03/22/2013   Lab Results  Component Value Date   ALT 20 03/22/2013   AST 16 03/22/2013   ALKPHOS 80 03/22/2013   BILITOT 0.4 03/22/2013   Lab Results  Component Value Date   CHOL 155 03/22/2013   Lab Results  Component Value Date   HDL 25* 03/22/2013   Lab Results  Component Value Date   LDLCALC 92 03/22/2013   Lab Results  Component Value Date   TRIG 189* 03/22/2013   Lab Results  Component Value Date   CHOLHDL 6.2 03/22/2013     Assessment & Plan  Tobacco abuse Patient has failed chantix and wellbutrin, will check with his insurance to see any nicotine replacement products. Discussed tactics for quitting for more than 3 minutes and importance of doing so  Anxiety as acute reaction to exceptional stress Using diazepam less frequently, we will stop this and he may continue Alprazolam prn but encouraged to minimize use. He is in a less  stressful job now   Neck pain on left side Better s/p surgical decompression but still needs Flexeril, Hydrocodone and Naproxen to get through many days  Hyperlipidemia Check lipids with next visit, avoid trans fats and simple carbs.

## 2014-01-02 NOTE — Assessment & Plan Note (Signed)
Patient has failed chantix and wellbutrin, will check with his insurance to see any nicotine replacement products. Discussed tactics for quitting for more than 3 minutes and importance of doing so

## 2014-01-02 NOTE — Assessment & Plan Note (Signed)
Using diazepam less frequently, we will stop this and he may continue Alprazolam prn but encouraged to minimize use. He is in a less stressful job now

## 2014-01-28 ENCOUNTER — Telehealth: Payer: Self-pay | Admitting: Family Medicine

## 2014-01-28 DIAGNOSIS — R52 Pain, unspecified: Secondary | ICD-10-CM

## 2014-01-28 NOTE — Telephone Encounter (Signed)
Patient is requesting a new prescription of vicoden

## 2014-01-31 MED ORDER — HYDROCODONE-ACETAMINOPHEN 7.5-325 MG PO TABS: 1.0000 | ORAL_TABLET | Freq: Three times a day (TID) | ORAL | Status: DC | PRN

## 2014-01-31 NOTE — Telephone Encounter (Signed)
OK to refill Vicoden, same strength, same sig, #60

## 2014-01-31 NOTE — Telephone Encounter (Signed)
Please advise refill? Last RX was done on 12-30-13 quantity 60 with 0 refills

## 2014-01-31 NOTE — Telephone Encounter (Signed)
Patient informed that RX is ready to be picked up

## 2014-03-01 ENCOUNTER — Other Ambulatory Visit: Payer: Self-pay

## 2014-03-01 DIAGNOSIS — M545 Low back pain, unspecified: Secondary | ICD-10-CM

## 2014-03-01 DIAGNOSIS — R52 Pain, unspecified: Secondary | ICD-10-CM

## 2014-03-01 MED ORDER — NAPROXEN 500 MG PO TABS: 500.0000 mg | ORAL_TABLET | Freq: Two times a day (BID) | ORAL | Status: DC

## 2014-03-01 MED ORDER — HYDROCODONE-ACETAMINOPHEN 7.5-325 MG PO TABS: 1.0000 | ORAL_TABLET | Freq: Three times a day (TID) | ORAL | Status: DC | PRN

## 2014-03-01 MED ORDER — CYCLOBENZAPRINE HCL 10 MG PO TABS: 10.0000 mg | ORAL_TABLET | Freq: Three times a day (TID) | ORAL | Status: DC | PRN

## 2014-03-01 NOTE — Telephone Encounter (Signed)
Pt left a message stating he needs his Naproxen, Flexeril and Hydrocodone refilled?  RX for Hydrocodone was done on 01-31-14 quantity 60 with 0 refills  RX for Naproxen was done on 09-18-12 quantity 60 with 1 refill  RX for Flexeril was done on 09-20-13 quantity 30 with 2 refills

## 2014-03-01 NOTE — Telephone Encounter (Signed)
Pt informed RX's are ready and put at front desk

## 2014-03-25 ENCOUNTER — Other Ambulatory Visit: Payer: Self-pay | Admitting: Family Medicine

## 2014-03-25 LAB — HEPATIC FUNCTION PANEL
ALT: 33 U/L (ref 0–53)
AST: 19 U/L (ref 0–37)
Albumin: 4.5 g/dL (ref 3.5–5.2)
Alkaline Phosphatase: 91 U/L (ref 39–117)
Bilirubin, Direct: 0.1 mg/dL (ref 0.0–0.3)
Indirect Bilirubin: 0.3 mg/dL (ref 0.2–1.2)
TOTAL PROTEIN: 7.1 g/dL (ref 6.0–8.3)
Total Bilirubin: 0.4 mg/dL (ref 0.2–1.2)

## 2014-03-25 LAB — RENAL FUNCTION PANEL
ALBUMIN: 4.5 g/dL (ref 3.5–5.2)
BUN: 9 mg/dL (ref 6–23)
CALCIUM: 9.5 mg/dL (ref 8.4–10.5)
CO2: 28 mEq/L (ref 19–32)
CREATININE: 0.84 mg/dL (ref 0.50–1.35)
Chloride: 100 mEq/L (ref 96–112)
Glucose, Bld: 96 mg/dL (ref 70–99)
PHOSPHORUS: 3.2 mg/dL (ref 2.3–4.6)
Potassium: 3.8 mEq/L (ref 3.5–5.3)
Sodium: 139 mEq/L (ref 135–145)

## 2014-03-26 LAB — URINALYSIS
Bilirubin Urine: NEGATIVE
Glucose, UA: NEGATIVE mg/dL
Hgb urine dipstick: NEGATIVE
Ketones, ur: NEGATIVE mg/dL
Leukocytes, UA: NEGATIVE
Nitrite: NEGATIVE
Protein, ur: NEGATIVE mg/dL
SPECIFIC GRAVITY, URINE: 1.009 (ref 1.005–1.030)
Urobilinogen, UA: 0.2 mg/dL (ref 0.0–1.0)
pH: 6.5 (ref 5.0–8.0)

## 2014-03-31 ENCOUNTER — Encounter: Payer: Self-pay | Admitting: Family Medicine

## 2014-03-31 ENCOUNTER — Ambulatory Visit (INDEPENDENT_AMBULATORY_CARE_PROVIDER_SITE_OTHER): Payer: BC Managed Care – PPO | Admitting: Family Medicine

## 2014-03-31 ENCOUNTER — Telehealth: Payer: Self-pay

## 2014-03-31 VITALS — BP 138/80 | HR 70 | Temp 98.1°F | Ht 70.0 in | Wt 184.1 lb

## 2014-03-31 DIAGNOSIS — F419 Anxiety disorder, unspecified: Secondary | ICD-10-CM

## 2014-03-31 DIAGNOSIS — M545 Low back pain, unspecified: Secondary | ICD-10-CM

## 2014-03-31 DIAGNOSIS — Z720 Tobacco use: Secondary | ICD-10-CM

## 2014-03-31 DIAGNOSIS — R52 Pain, unspecified: Secondary | ICD-10-CM

## 2014-03-31 DIAGNOSIS — F32A Depression, unspecified: Secondary | ICD-10-CM

## 2014-03-31 DIAGNOSIS — E785 Hyperlipidemia, unspecified: Secondary | ICD-10-CM

## 2014-03-31 DIAGNOSIS — F17200 Nicotine dependence, unspecified, uncomplicated: Secondary | ICD-10-CM

## 2014-03-31 DIAGNOSIS — R5381 Other malaise: Secondary | ICD-10-CM

## 2014-03-31 DIAGNOSIS — Z0000 Encounter for general adult medical examination without abnormal findings: Secondary | ICD-10-CM

## 2014-03-31 DIAGNOSIS — F329 Major depressive disorder, single episode, unspecified: Secondary | ICD-10-CM

## 2014-03-31 DIAGNOSIS — R5383 Other fatigue: Secondary | ICD-10-CM

## 2014-03-31 LAB — HEPATIC FUNCTION PANEL
ALT: 39 U/L (ref 0–53)
AST: 20 U/L (ref 0–37)
Albumin: 4.6 g/dL (ref 3.5–5.2)
Alkaline Phosphatase: 83 U/L (ref 39–117)
BILIRUBIN DIRECT: 0.1 mg/dL (ref 0.0–0.3)
Indirect Bilirubin: 0.3 mg/dL (ref 0.2–1.2)
TOTAL PROTEIN: 7.2 g/dL (ref 6.0–8.3)
Total Bilirubin: 0.4 mg/dL (ref 0.2–1.2)

## 2014-03-31 LAB — CBC
HCT: 44.4 % (ref 39.0–52.0)
Hemoglobin: 15.8 g/dL (ref 13.0–17.0)
MCH: 30.7 pg (ref 26.0–34.0)
MCHC: 35.6 g/dL (ref 30.0–36.0)
MCV: 86.4 fL (ref 78.0–100.0)
PLATELETS: 259 10*3/uL (ref 150–400)
RBC: 5.14 MIL/uL (ref 4.22–5.81)
RDW: 13.3 % (ref 11.5–15.5)
WBC: 9.5 10*3/uL (ref 4.0–10.5)

## 2014-03-31 LAB — LIPID PANEL
CHOL/HDL RATIO: 6.3 ratio
Cholesterol: 207 mg/dL — ABNORMAL HIGH (ref 0–200)
HDL: 33 mg/dL — ABNORMAL LOW (ref >39)
LDL CALC: 119 mg/dL — AB (ref 0–99)
Triglycerides: 276 mg/dL — ABNORMAL HIGH (ref <150)
VLDL: 55 mg/dL — AB (ref 0–40)

## 2014-03-31 LAB — RENAL FUNCTION PANEL
ALBUMIN: 4.6 g/dL (ref 3.5–5.2)
BUN: 6 mg/dL (ref 6–23)
CHLORIDE: 101 meq/L (ref 96–112)
CO2: 27 mEq/L (ref 19–32)
CREATININE: 0.7 mg/dL (ref 0.50–1.35)
Calcium: 9.4 mg/dL (ref 8.4–10.5)
Glucose, Bld: 83 mg/dL (ref 70–99)
PHOSPHORUS: 3.6 mg/dL (ref 2.3–4.6)
Potassium: 4.1 mEq/L (ref 3.5–5.3)
Sodium: 138 mEq/L (ref 135–145)

## 2014-03-31 LAB — TSH: TSH: 1.302 u[IU]/mL (ref 0.350–4.500)

## 2014-03-31 LAB — TESTOSTERONE: Testosterone: 216 ng/dL — ABNORMAL LOW (ref 300–890)

## 2014-03-31 MED ORDER — CYCLOBENZAPRINE HCL 10 MG PO TABS: 10.0000 mg | ORAL_TABLET | Freq: Three times a day (TID) | ORAL | Status: DC | PRN

## 2014-03-31 MED ORDER — ALPRAZOLAM 1 MG PO TABS: 1.0000 mg | ORAL_TABLET | Freq: Two times a day (BID) | ORAL | Status: DC | PRN

## 2014-03-31 MED ORDER — VARENICLINE TARTRATE 1 MG PO TABS: 1.0000 mg | ORAL_TABLET | Freq: Two times a day (BID) | ORAL | Status: DC

## 2014-03-31 MED ORDER — NAPROXEN 500 MG PO TABS: 500.0000 mg | ORAL_TABLET | Freq: Two times a day (BID) | ORAL | Status: DC

## 2014-03-31 MED ORDER — VARENICLINE TARTRATE 0.5 MG X 11 & 1 MG X 42 PO MISC: ORAL | Status: DC

## 2014-03-31 MED ORDER — HYDROCODONE-ACETAMINOPHEN 7.5-325 MG PO TABS: 1.0000 | ORAL_TABLET | Freq: Three times a day (TID) | ORAL | Status: DC | PRN

## 2014-03-31 NOTE — Telephone Encounter (Signed)
Controlled substance form filled out for patient

## 2014-03-31 NOTE — Patient Instructions (Signed)
Nicotine Addiction Nicotine can act as both a stimulant (excites/activates) and a sedative (calms/quiets). Immediately after exposure to nicotine, there is a "kick" caused in part by the drug's stimulation of the adrenal glands and resulting discharge of adrenaline (epinephrine). The rush of adrenaline stimulates the body and causes a sudden release of sugar. This means that smokers are always slightly hyperglycemic. Hyperglycemic means that the blood sugar is high, just like in diabetics. Nicotine also decreases the amount of insulin which helps control sugar levels in the body. There is an increase in blood pressure, breathing, and the rate of heart beats.  In addition, nicotine indirectly causes a release of dopamine in the brain that controls pleasure and motivation. A similar reaction is seen with other drugs of abuse, such as cocaine and heroin. This dopamine release is thought to cause the pleasurable sensations when smoking. In some different cases, nicotine can also create a calming effect, depending on sensitivity of the smoker's nervous system and the dose of nicotine taken. WHAT HAPPENS WHEN NICOTINE IS TAKEN FOR LONG PERIODS OF TIME?  Long-term use of nicotine results in addiction. It is difficult to stop.  Repeated use of nicotine creates tolerance. Higher doses of nicotine are needed to get the "kick." When nicotine use is stopped, withdrawal may last a month or more. Withdrawal may begin within a few hours after the last cigarette. Symptoms peak within the first few days and may lessen within a few weeks. For some people, however, symptoms may last for months or longer. Withdrawal symptoms include:   Irritability.  Craving.  Learning and attention deficits.  Sleep disturbances.  Increased appetite. Craving for tobacco may last for 6 months or longer. Many behaviors done while using nicotine can also play a part in the severity of withdrawal symptoms. For some people, the feel,  smell, and sight of a cigarette and the ritual of obtaining, handling, lighting, and smoking the cigarette are closely linked with the pleasure of smoking. When stopped, they also miss the related behaviors which make the withdrawal or craving worse. While nicotine gum and patches may lessen the drug aspects of withdrawal, cravings often persist. WHAT ARE THE MEDICAL CONSEQUENCES OF NICOTINE USE?  Nicotine addiction accounts for one-third of all cancers. The top cancer caused by tobacco is lung cancer. Lung cancer is the number one cancer killer of both men and women.  Smoking is also associated with cancers of the:  Mouth.  Pharynx.  Larynx.  Esophagus.  Stomach.  Pancreas.  Cervix.  Kidney.  Ureter.  Bladder.  Smoking also causes lung diseases such as lasting (chronic) bronchitis and emphysema.  It worsens asthma in adults and children.  Smoking increases the risk of heart disease, including:  Stroke.  Heart attack.  Vascular disease.  Aneurysm.  Passive or secondary smoke can also increase medical risks including:  Asthma in children.  Sudden Infant Death Syndrome (SIDS).  Additionally, dropped cigarettes are the leading cause of residential fire fatalities.  Nicotine poisoning has been reported from accidental ingestion of tobacco products by children and pets. Death usually results in a few minutes from respiratory failure (when a person stops breathing) caused by paralysis. TREATMENT   Medication. Nicotine replacement medicines such as nicotine gum and the patch are used to stop smoking. These medicines gradually lower the dosage of nicotine in the body. These medicines do not contain the carbon monoxide and other toxins found in tobacco smoke.  Hypnotherapy.  Relaxation therapy.  Nicotine Anonymous (a 12-step support   program). Find times and locations in your local yellow pages. Document Released: 07/24/2004 Document Revised: 02/10/2012 Document  Reviewed: 12/16/2007 [employer] Patient Information 2014 [employer].  

## 2014-03-31 NOTE — Progress Notes (Signed)
Pre visit review using our clinic review tool, if applicable. No additional management support is needed unless otherwise documented below in the visit note. 

## 2014-04-01 ENCOUNTER — Telehealth: Payer: Self-pay

## 2014-04-01 DIAGNOSIS — R7989 Other specified abnormal findings of blood chemistry: Secondary | ICD-10-CM

## 2014-04-01 NOTE — Telephone Encounter (Signed)
Lab ordered.

## 2014-04-03 ENCOUNTER — Encounter: Payer: Self-pay | Admitting: Family Medicine

## 2014-04-03 DIAGNOSIS — F419 Anxiety disorder, unspecified: Secondary | ICD-10-CM

## 2014-04-03 DIAGNOSIS — F32A Depression, unspecified: Secondary | ICD-10-CM | POA: Insufficient documentation

## 2014-04-03 DIAGNOSIS — F329 Major depressive disorder, single episode, unspecified: Secondary | ICD-10-CM | POA: Insufficient documentation

## 2014-04-03 NOTE — Assessment & Plan Note (Signed)
Encouraged complete cessation. Discussed need to quit as relates to risk of numerous cancers, cardiac and pulmonary disease as well as neurologic complications. Counseled for greater than 3 minutes 

## 2014-04-03 NOTE — Assessment & Plan Note (Signed)
Encouraged heart healthy diet, increase exercise, avoid trans fats, consider a krill oil cap daily 

## 2014-04-03 NOTE — Assessment & Plan Note (Signed)
Doing well, no changes, may use Alprazolam prn

## 2014-04-03 NOTE — Assessment & Plan Note (Signed)
Allowed refill of his pain meds to use prn, Flexeril prn

## 2014-04-03 NOTE — Assessment & Plan Note (Signed)
Patient encouraged to maintain heart healthy diet, regular exercise, adequate sleep. Consider daily probiotics. Take medications as prescribed 

## 2014-04-03 NOTE — Progress Notes (Signed)
Patient ID: Elijah Marquez, male   DOB: 07-18-1971, 43 y.o.   MRN: [id number] Elijah Marquez [id number] 19-Jul-1971 04/03/2014      Progress Note-Follow Up  Subjective  Chief Complaint  No chief complaint on file.   HPI  Patient is a 43 year old male in today for routine medical care. Patient is in today for annual exam. He's been struggling with increased swelling and discomfort in his knees lately. He has ongoing back pain, neck pain and left upper extremity pain but it is improved. Pain medications are adequate. He continues to smoke 2 packs per day and acknowledges his shortness of breath is worsening. No other recent illness. Denies CP/palp/SOB/HA/congestion/fevers/GI or GU c/o. Taking meds as prescribed  Past Medical History  Diagnosis Date  . Chicken pox as a child  . Osteoarthritis of left knee     and right knee  . Tobacco abuse   . Hyperlipidemia   . Neck pain on left side 08/23/2012  . Anxiety 08/23/2012  . Hyperlipidemia 08/23/2012  . Fatigue   . Anxiety as acute reaction to exceptional stress 09/20/2012  . Back pain 07/29/2013    Past Surgical History  Procedure Laterality Date  . Knee surgery      right knee 2012, left knee 2011 (arthroscopy)  . Wisdom tooth extraction    . Neck surgery  12/08/12    Family History  Problem Relation Age of Onset  . Fibromyalgia Mother   . Coronary artery disease Mother   . Hypertension Mother   . Heart attack Mother   . Heart disease Mother     CAD  . Fibromyalgia Sister   . Hypertension Sister   . Heart disease Sister   . Other Son     mildly mentally handicap  . Seizures Son   . Lupus Sister   . Emphysema Sister     smoker    History   Social History  . Marital Status: Married    Spouse Name: N/A    Number of Children: N/A  . Years of Education: N/A   Occupational History  . Not on file.   Social History Main Topics  . Smoking status: Current Every Day Smoker -- 1.50 packs/day for 25 years    Types:  Cigarettes  . Smokeless tobacco: Never Used  . Alcohol Use: No  . Drug Use: No  . Sexual Activity: Yes    Partners: Female   Other Topics Concern  . Not on file   Social History Narrative  . No narrative on file    No current outpatient prescriptions on file prior to visit.   No current facility-administered medications on file prior to visit.    Allergies  Allergen Reactions  . Penicillins Swelling and Rash    Review of Systems  Review of Systems  Constitutional: Negative for fever.  HENT: Negative for congestion.   Eyes: Negative for discharge.  Respiratory: Positive for shortness of breath.   Cardiovascular: Negative for chest pain, palpitations and leg swelling.  Gastrointestinal: Positive for constipation. Negative for nausea, abdominal pain and diarrhea.  Genitourinary: Negative for dysuria.  Musculoskeletal: Positive for back pain and neck pain. Negative for falls.  Skin: Negative for rash.  Neurological: Negative for loss of consciousness and headaches.  Endo/Heme/Allergies: Negative for polydipsia.  Psychiatric/Behavioral: Negative for depression and suicidal ideas. The patient is not nervous/anxious and does not have insomnia.     Objective  BP 138/80  Pulse 70  Temp(Src) 98.1 F (36.7  C) (Oral)  Ht 5\' 10"  (1.778 m)  Wt 184 lb 1.9 oz (83.516 kg)  BMI 26.42 kg/m2  SpO2 98%  Physical Exam  Physical Exam  Constitutional: He is oriented to person, place, and time and well-developed, well-nourished, and in no distress. No distress.  HENT:  Head: Normocephalic and atraumatic.  Eyes: Conjunctivae are normal.  Neck: Neck supple. No thyromegaly present.  Cardiovascular: Normal rate, regular rhythm and normal heart sounds.   No murmur heard. Pulmonary/Chest: Effort normal and breath sounds normal. No respiratory distress.  Abdominal: He exhibits no distension and no mass. There is no tenderness.  Musculoskeletal: He exhibits no edema.  Neurological: He  is alert and oriented to person, place, and time.  Skin: Skin is warm.  Psychiatric: Memory, affect and judgment normal.    Lab Results  Component Value Date   TSH 1.302 03/31/2014   Lab Results  Component Value Date   WBC 9.5 03/31/2014   HGB 15.8 03/31/2014   HCT 44.4 03/31/2014   MCV 86.4 03/31/2014   PLT 259 03/31/2014   Lab Results  Component Value Date   CREATININE 0.70 03/31/2014   BUN 6 03/31/2014   NA 138 03/31/2014   K 4.1 03/31/2014   CL 101 03/31/2014   CO2 27 03/31/2014   Lab Results  Component Value Date   ALT 39 03/31/2014   AST 20 03/31/2014   ALKPHOS 83 03/31/2014   BILITOT 0.4 03/31/2014   Lab Results  Component Value Date   CHOL 207* 03/31/2014   Lab Results  Component Value Date   HDL 33* 03/31/2014   Lab Results  Component Value Date   LDLCALC 119* 03/31/2014   Lab Results  Component Value Date   TRIG 276* 03/31/2014   Lab Results  Component Value Date   CHOLHDL 6.3 03/31/2014     Assessment & Plan  Tobacco abuse Encouraged complete cessation. Discussed need to quit as relates to risk of numerous cancers, cardiac and pulmonary disease as well as neurologic complications. Counseled for greater than 3 minutes  Preventative health care Patient encouraged to maintain heart healthy diet, regular exercise, adequate sleep. Consider daily probiotics. Take medications as prescribed  Back pain Allowed refill of his pain meds to use prn, Flexeril prn  Hyperlipidemia Encouraged heart healthy diet, increase exercise, avoid trans fats, consider a krill oil cap daily  Anxiety and depression Doing well, no changes, may use Alprazolam prn

## 2014-04-28 ENCOUNTER — Other Ambulatory Visit: Payer: Self-pay

## 2014-04-28 DIAGNOSIS — R52 Pain, unspecified: Secondary | ICD-10-CM

## 2014-04-28 MED ORDER — HYDROCODONE-ACETAMINOPHEN 7.5-325 MG PO TABS: 1.0000 | ORAL_TABLET | Freq: Three times a day (TID) | ORAL | Status: DC | PRN

## 2014-04-28 NOTE — Telephone Encounter (Signed)
RX printed for md to sign  Pt informed that RX is ready to be picked up   Last RX done on 03-31-14 quantity 60 with 0 refills

## 2014-05-30 ENCOUNTER — Other Ambulatory Visit: Payer: Self-pay

## 2014-05-30 DIAGNOSIS — R52 Pain, unspecified: Secondary | ICD-10-CM

## 2014-05-30 MED ORDER — HYDROCODONE-ACETAMINOPHEN 7.5-325 MG PO TABS: 1.0000 | ORAL_TABLET | Freq: Three times a day (TID) | ORAL | Status: DC | PRN

## 2014-05-30 NOTE — Telephone Encounter (Signed)
RX printed for md to sign Pt informed that he can pick this up today or tomorrow

## 2014-06-27 ENCOUNTER — Other Ambulatory Visit: Payer: Self-pay

## 2014-06-27 DIAGNOSIS — R52 Pain, unspecified: Secondary | ICD-10-CM

## 2014-06-27 MED ORDER — HYDROCODONE-ACETAMINOPHEN 7.5-325 MG PO TABS: 1.0000 | ORAL_TABLET | Freq: Three times a day (TID) | ORAL | Status: DC | PRN

## 2014-06-27 NOTE — Telephone Encounter (Signed)
Last RX was done on 05-30-14 quantity 60 with 0 refills for the Hydrocodone  RX printed for md to sign and pt informed

## 2014-07-26 ENCOUNTER — Other Ambulatory Visit: Payer: Self-pay

## 2014-07-26 DIAGNOSIS — R52 Pain, unspecified: Secondary | ICD-10-CM

## 2014-07-26 MED ORDER — HYDROCODONE-ACETAMINOPHEN 7.5-325 MG PO TABS: 1.0000 | ORAL_TABLET | Freq: Three times a day (TID) | ORAL | Status: DC | PRN

## 2014-07-26 NOTE — Telephone Encounter (Signed)
Last RX was done 06-27-14 quantity 60 with 0 refills  Pt stated he knows its a day early but he is going out of town.  I informed patient that he can pick this up tomorrow

## 2014-08-25 ENCOUNTER — Telehealth: Payer: Self-pay | Admitting: Family Medicine

## 2014-08-25 DIAGNOSIS — R52 Pain, unspecified: Secondary | ICD-10-CM

## 2014-08-25 MED ORDER — HYDROCODONE-ACETAMINOPHEN 7.5-325 MG PO TABS: 1.0000 | ORAL_TABLET | Freq: Three times a day (TID) | ORAL | Status: DC | PRN

## 2014-08-25 NOTE — Telephone Encounter (Signed)
Caller name: Martice  Relation to pt: self  Call back number: [phone]   Reason for call: pt requesting a refill HYDROcodone-acetaminophen

## 2014-08-25 NOTE — Telephone Encounter (Signed)
RX printed for patient to pick up  Patient informed

## 2014-09-23 ENCOUNTER — Telehealth: Payer: Self-pay | Admitting: Family Medicine

## 2014-09-23 DIAGNOSIS — R52 Pain, unspecified: Secondary | ICD-10-CM

## 2014-09-23 MED ORDER — HYDROCODONE-ACETAMINOPHEN 7.5-325 MG PO TABS: 1.0000 | ORAL_TABLET | Freq: Three times a day (TID) | ORAL | Status: DC | PRN

## 2014-09-23 NOTE — Telephone Encounter (Signed)
Please inform pt that rx is ready to be picked up

## 2014-09-23 NOTE — Telephone Encounter (Signed)
Caller name: Eliyohu Relation to pt: Call back number:[phone]   Reason for call:  Pt needs refill on Rx HYDROcodone-acetaminophen (NORCO) 7.5-325 MG per tablet . Call when complete

## 2014-09-26 ENCOUNTER — Encounter: Payer: Self-pay | Admitting: Family Medicine

## 2014-09-26 ENCOUNTER — Ambulatory Visit (INDEPENDENT_AMBULATORY_CARE_PROVIDER_SITE_OTHER): Payer: BC Managed Care – PPO | Admitting: Family Medicine

## 2014-09-26 VITALS — BP 144/90 | HR 74 | Temp 97.8°F | Ht 70.0 in | Wt 187.0 lb

## 2014-09-26 DIAGNOSIS — R7989 Other specified abnormal findings of blood chemistry: Secondary | ICD-10-CM

## 2014-09-26 DIAGNOSIS — M1712 Unilateral primary osteoarthritis, left knee: Secondary | ICD-10-CM

## 2014-09-26 DIAGNOSIS — E785 Hyperlipidemia, unspecified: Secondary | ICD-10-CM

## 2014-09-26 DIAGNOSIS — Z720 Tobacco use: Secondary | ICD-10-CM

## 2014-09-26 DIAGNOSIS — R030 Elevated blood-pressure reading, without diagnosis of hypertension: Secondary | ICD-10-CM

## 2014-09-26 DIAGNOSIS — E291 Testicular hypofunction: Secondary | ICD-10-CM

## 2014-09-26 DIAGNOSIS — IMO0001 Reserved for inherently not codable concepts without codable children: Secondary | ICD-10-CM

## 2014-09-26 DIAGNOSIS — M179 Osteoarthritis of knee, unspecified: Secondary | ICD-10-CM

## 2014-09-26 LAB — HEPATIC FUNCTION PANEL
ALT: 35 U/L (ref 0–53)
AST: 26 U/L (ref 0–37)
Albumin: 3.8 g/dL (ref 3.5–5.2)
Alkaline Phosphatase: 85 U/L (ref 39–117)
BILIRUBIN TOTAL: 0.3 mg/dL (ref 0.2–1.2)
Bilirubin, Direct: 0 mg/dL (ref 0.0–0.3)
Total Protein: 7.7 g/dL (ref 6.0–8.3)

## 2014-09-26 LAB — RENAL FUNCTION PANEL
ALBUMIN: 3.8 g/dL (ref 3.5–5.2)
BUN: 9 mg/dL (ref 6–23)
CO2: 30 meq/L (ref 19–32)
Calcium: 9.4 mg/dL (ref 8.4–10.5)
Chloride: 102 mEq/L (ref 96–112)
Creatinine, Ser: 0.9 mg/dL (ref 0.4–1.5)
GFR: 96.81 mL/min (ref >60.00)
Glucose, Bld: 101 mg/dL — ABNORMAL HIGH (ref 70–99)
Phosphorus: 3.8 mg/dL (ref 2.3–4.6)
Potassium: 3.5 mEq/L (ref 3.5–5.1)
Sodium: 136 mEq/L (ref 135–145)

## 2014-09-26 LAB — LIPID PANEL
Cholesterol: 201 mg/dL — ABNORMAL HIGH (ref 0–200)
HDL: 29.7 mg/dL — ABNORMAL LOW (ref >39.00)
NonHDL: 171.3
Total CHOL/HDL Ratio: 7
Triglycerides: 391 mg/dL — ABNORMAL HIGH (ref 0.0–149.0)
VLDL: 78.2 mg/dL — AB (ref 0.0–40.0)

## 2014-09-26 LAB — CBC
HEMATOCRIT: 46.3 % (ref 39.0–52.0)
HEMOGLOBIN: 15.5 g/dL (ref 13.0–17.0)
MCHC: 33.5 g/dL (ref 30.0–36.0)
MCV: 90.8 fl (ref 78.0–100.0)
PLATELETS: 229 10*3/uL (ref 150.0–400.0)
RBC: 5.1 Mil/uL (ref 4.22–5.81)
RDW: 13.3 % (ref 11.5–15.5)
WBC: 10.2 10*3/uL (ref 4.0–10.5)

## 2014-09-26 LAB — TESTOSTERONE: Testosterone: 165.44 ng/dL — ABNORMAL LOW (ref 300.00–890.00)

## 2014-09-26 LAB — LDL CHOLESTEROL, DIRECT: Direct LDL: 128.8 mg/dL

## 2014-09-26 LAB — TSH: TSH: 1.47 u[IU]/mL (ref 0.35–4.50)

## 2014-09-26 MED ORDER — HYDROCODONE-ACETAMINOPHEN 10-325 MG PO TABS
1.0000 | ORAL_TABLET | Freq: Three times a day (TID) | ORAL | Status: DC | PRN
Start: 2014-09-26 — End: 2014-10-31

## 2014-09-26 NOTE — Progress Notes (Signed)
Pre visit review using our clinic review tool, if applicable. No additional management support is needed unless otherwise documented below in the visit note. 

## 2014-09-26 NOTE — Patient Instructions (Signed)

## 2014-09-26 NOTE — Assessment & Plan Note (Signed)
Encouraged complete cessation. Discussed need to quit as relates to risk of numerous cancers, cardiac and pulmonary disease as well as neurologic complications. Counseled for greater than 3 minutes 

## 2014-09-27 ENCOUNTER — Telehealth: Payer: Self-pay | Admitting: *Deleted

## 2014-09-27 ENCOUNTER — Encounter: Payer: Self-pay | Admitting: Family Medicine

## 2014-09-27 MED ORDER — FENOFIBRATE 145 MG PO TABS: 145.0000 mg | ORAL_TABLET | Freq: Every day | ORAL | Status: DC

## 2014-09-27 MED ORDER — TESTOSTERONE 50 MG/5GM (1%) TD GEL: TRANSDERMAL | Status: DC

## 2014-09-27 NOTE — Telephone Encounter (Signed)
Prior authorization for testosterone initiated on [email or web address]. Awaiting determination. JG//CMA

## 2014-10-02 DIAGNOSIS — IMO0001 Reserved for inherently not codable concepts without codable children: Secondary | ICD-10-CM | POA: Insufficient documentation

## 2014-10-02 DIAGNOSIS — E291 Testicular hypofunction: Secondary | ICD-10-CM | POA: Insufficient documentation

## 2014-10-02 DIAGNOSIS — R030 Elevated blood-pressure reading, without diagnosis of hypertension: Secondary | ICD-10-CM

## 2014-10-02 HISTORY — DX: Testicular hypofunction: E29.1

## 2014-10-02 NOTE — Assessment & Plan Note (Signed)
Pain is worsening, L>R knee he is encouraged to consider orthopaedics and knee replacement, continue current meds for now

## 2014-10-02 NOTE — Assessment & Plan Note (Signed)
Worsening, is encouraged to try Androgel due to symptoms.

## 2014-10-02 NOTE — Assessment & Plan Note (Signed)
Encouraged heart healthy diet, increase exercise, avoid trans fats, consider a krill oil cap daily 

## 2014-10-02 NOTE — Progress Notes (Signed)
Elijah Marquez [id number] 18-Nov-1971 10/02/2014      Progress Note-Follow Up  Subjective  Chief Complaint  Chief Complaint  Patient presents with  . Follow-up    6 month    HPI  Patient is a 43 year old male in today for routine medical care. He continues to struggle with significant pain, his left knee pain is nearly debilitating but he is managing. Is also having right knee pain as well as back pain. No recent illness. Unfortunately he is struggling with 2 ppd smoking habit and has significant stressors so he does not feel able to quit at this time. Denies CP/palp/SOB/HA/congestion/fevers/GI or GU c/o. Taking meds as prescribed  Past Medical History  Diagnosis Date  . Chicken pox as a child  . Osteoarthritis of left knee     and right knee  . Tobacco abuse   . Hyperlipidemia   . Neck pain on left side 08/23/2012  . Anxiety 08/23/2012  . Hyperlipidemia 08/23/2012  . Fatigue   . Anxiety as acute reaction to exceptional stress 09/20/2012  . Back pain 07/29/2013    Past Surgical History  Procedure Laterality Date  . Knee surgery      right knee 2012, left knee 2011 (arthroscopy)  . Wisdom tooth extraction    . Neck surgery  12/08/12    Family History  Problem Relation Age of Onset  . Fibromyalgia Mother   . Coronary artery disease Mother   . Hypertension Mother   . Heart attack Mother   . Heart disease Mother     CAD  . Fibromyalgia Sister   . Hypertension Sister   . Heart disease Sister   . Other Son     mildly mentally handicap  . Seizures Son   . Lupus Sister   . Emphysema Sister     smoker    History   Social History  . Marital Status: Married    Spouse Name: N/A    Number of Children: N/A  . Years of Education: N/A   Occupational History  . Not on file.   Social History Main Topics  . Smoking status: Current Every Day Smoker -- 1.50 packs/day for 25 years    Types: Cigarettes  . Smokeless tobacco: Never Used  . Alcohol Use: No  . Drug Use: No   . Sexual Activity:    Partners: Female   Other Topics Concern  . Not on file   Social History Narrative  . No narrative on file    Current Outpatient Prescriptions on File Prior to Visit  Medication Sig Dispense Refill  . ALPRAZolam (XANAX) 1 MG tablet Take 1 tablet (1 mg total) by mouth 2 (two) times daily as needed for sleep or anxiety. 60 tablet 2  . cyclobenzaprine (FLEXERIL) 10 MG tablet Take 1 tablet (10 mg total) by mouth 3 (three) times daily as needed for muscle spasms. 30 tablet 5  . naproxen (NAPROSYN) 500 MG tablet Take 1 tablet (500 mg total) by mouth 2 (two) times daily with a meal. 60 tablet 5   No current facility-administered medications on file prior to visit.    Allergies  Allergen Reactions  . Penicillins Swelling and Rash    Review of Systems  Review of Systems  Constitutional: Positive for malaise/fatigue. Negative for fever.  HENT: Negative for congestion.   Eyes: Negative for discharge.  Respiratory: Negative for shortness of breath.   Cardiovascular: Negative for chest pain, palpitations and leg swelling.  Gastrointestinal: Negative for  nausea, abdominal pain and diarrhea.  Genitourinary: Negative for dysuria.  Musculoskeletal: Positive for back pain and joint pain. Negative for falls.  Skin: Negative for rash.  Neurological: Negative for loss of consciousness and headaches.  Endo/Heme/Allergies: Negative for polydipsia.  Psychiatric/Behavioral: Negative for depression and suicidal ideas. The patient is not nervous/anxious and does not have insomnia.     Objective  BP 144/90 mmHg  Pulse 74  Temp(Src) 97.8 F (36.6 C) (Oral)  Ht 5\' 10"  (1.778 m)  Wt 187 lb (84.823 kg)  BMI 26.83 kg/m2  SpO2 100%  Physical Exam Physical Exam  Constitutional: He is oriented to person, place, and time and well-developed, well-nourished, and in no distress. No distress.  HENT:  Head: Normocephalic and atraumatic.  Eyes: Conjunctivae are normal.  Neck:  Neck supple. No thyromegaly present.  Cardiovascular: Normal rate, regular rhythm and normal heart sounds.   No murmur heard. Pulmonary/Chest: Effort normal and breath sounds normal. No respiratory distress.  Abdominal: He exhibits no distension and no mass. There is no tenderness.  Musculoskeletal: He exhibits no edema.  Neurological: He is alert and oriented to person, place, and time.  Skin: Skin is warm.  Psychiatric: Memory, affect and judgment normal.    Lab Results  Component Value Date   TSH 1.47 09/26/2014   Lab Results  Component Value Date   WBC 10.2 09/26/2014   HGB 15.5 09/26/2014   HCT 46.3 09/26/2014   MCV 90.8 09/26/2014   PLT 229.0 09/26/2014   Lab Results  Component Value Date   CREATININE 0.9 09/26/2014   BUN 9 09/26/2014   NA 136 09/26/2014   K 3.5 09/26/2014   CL 102 09/26/2014   CO2 30 09/26/2014   Lab Results  Component Value Date   ALT 35 09/26/2014   AST 26 09/26/2014   ALKPHOS 85 09/26/2014   BILITOT 0.3 09/26/2014   Lab Results  Component Value Date   CHOL 201* 09/26/2014   Lab Results  Component Value Date   HDL 29.70* 09/26/2014   Lab Results  Component Value Date   LDLCALC 119* 03/31/2014   Lab Results  Component Value Date   TRIG 391.0* 09/26/2014   Lab Results  Component Value Date   CHOLHDL 7 09/26/2014     Assessment & Plan  Tobacco abuse Encouraged complete cessation. Discussed need to quit as relates to risk of numerous cancers, cardiac and pulmonary disease as well as neurologic complications. Counseled for greater than 3 minutes  Osteoarthritis of left knee Pain is worsening, L>R knee he is encouraged to consider orthopaedics and knee replacement, continue current meds for now  Hyperlipidemia Encouraged heart healthy diet, increase exercise, avoid trans fats, consider a krill oil cap daily  Elevated BP Well controlled. Encouraged heart healthy diet such as the DASH diet and exercise as tolerated.   Low  testosterone Worsening, is encouraged to try Androgel due to symptoms.

## 2014-10-02 NOTE — Assessment & Plan Note (Signed)
Well controlled. Encouraged heart healthy diet such as the DASH diet and exercise as tolerated.  

## 2014-10-03 ENCOUNTER — Encounter: Payer: Self-pay | Admitting: Family Medicine

## 2014-10-03 ENCOUNTER — Other Ambulatory Visit: Payer: Self-pay

## 2014-10-03 ENCOUNTER — Telehealth: Payer: Self-pay

## 2014-10-03 NOTE — Telephone Encounter (Signed)
Spoke with PCP and the chart was up dated to reflect the correct ICD 10 code. Called [facility] and updated PA data. Approved until 10/2015.  Spoke with pharmacy and advised that PA was approved. It went through on their end. Called and let patient know that he could pick it up. Reviewed directions of how to apply and precautions concerning touching others with medication exposed.  Patient verbalizes understanding.

## 2014-10-03 NOTE — Telephone Encounter (Signed)
Patient presents to the office for information on his testosterone Rx PA. Spoke with pharmacy who states that it was denied due to an incorrect ICD10 code. Spoke with [facility] who states that the code needs to be hypogonadism rather than low testosterone. Sent to Dr B for approval.   [facility]: [phone] Case# [id number]

## 2014-10-03 NOTE — Progress Notes (Signed)
Patient ID: Elijah Marquez, male   DOB: 1971-01-12, 43 y.o.   MRN: [id number] Changed diagnosis in chart from Low Testosterone to Hypogonadism at insurance companies request so they will no longer refuse to pay for this patient's medication.

## 2014-10-03 NOTE — Telephone Encounter (Signed)
ERROR KP 

## 2014-10-26 ENCOUNTER — Telehealth: Payer: Self-pay | Admitting: Family Medicine

## 2014-10-26 DIAGNOSIS — F419 Anxiety disorder, unspecified: Principal | ICD-10-CM

## 2014-10-26 DIAGNOSIS — F32A Depression, unspecified: Secondary | ICD-10-CM

## 2014-10-26 DIAGNOSIS — F329 Major depressive disorder, single episode, unspecified: Secondary | ICD-10-CM

## 2014-10-26 NOTE — Telephone Encounter (Signed)
Caller name:Berthelot, Gianluca Relation to JS:UNHR Call back [phone] Pharmacy:  Reason for call: pt needs rx HYDROcodone-acetaminophen (NORCO) 10-325 MG and ALPRAZolam (XANAX) 1 MG tablet. Please call when available for pick up.

## 2014-10-31 MED ORDER — ALPRAZOLAM 1 MG PO TABS: 1.0000 mg | ORAL_TABLET | Freq: Two times a day (BID) | ORAL | Status: DC | PRN

## 2014-10-31 MED ORDER — HYDROCODONE-ACETAMINOPHEN 10-325 MG PO TABS: 1.0000 | ORAL_TABLET | Freq: Three times a day (TID) | ORAL | Status: DC | PRN

## 2014-10-31 NOTE — Telephone Encounter (Signed)
OK to refill both meds but let him know we need his UDS for our records. THX

## 2014-10-31 NOTE — Telephone Encounter (Signed)
Informed patient of this.  °

## 2014-10-31 NOTE — Telephone Encounter (Signed)
Request refill on: Hydrocodone-acetaminophen (Norco) 10/325 Lat Refill: 09/26/2014 #60 0 refills  Alprazolam (Xanax) 1mg  Last Refill: 03/31/2014 #60 with 2 refills   Contract on file/No UDS noted  Please advise.

## 2014-10-31 NOTE — Telephone Encounter (Signed)
Please inform pt that his RX's are ready to be picked up  Controlled Substance Contract Signed (03-31-14)  Will print both rxs

## 2014-11-30 ENCOUNTER — Telehealth: Payer: Self-pay | Admitting: Family Medicine

## 2014-11-30 NOTE — Telephone Encounter (Signed)
Caller name:Bourcier, Tank Relation to CV:ELFY Call back number:[phone] Pharmacy:  Reason for call: pt is needing rx HYDROcodone-acetaminophen (NORCO) 10-325 MG per tablet  Please call when available for pick up

## 2014-12-01 ENCOUNTER — Other Ambulatory Visit: Payer: Self-pay | Admitting: Family Medicine

## 2014-12-01 MED ORDER — HYDROCODONE-ACETAMINOPHEN 10-325 MG PO TABS: 1.0000 | ORAL_TABLET | Freq: Three times a day (TID) | ORAL | Status: DC | PRN

## 2014-12-01 NOTE — Telephone Encounter (Signed)
Requesting Hydrocodone-ACE 10-325mg -Take 1 tablet by mouth every 8 hours as needed. Last refill:10/31/14;#60,0 Last OV:09/26/14 UDS;No one on file Please advise.//AB/CMA

## 2014-12-01 NOTE — Telephone Encounter (Signed)
Pt notified and rx placed at front desk for pick up.

## 2014-12-01 NOTE — Telephone Encounter (Signed)
I have printed please let him know it is done

## 2014-12-29 ENCOUNTER — Telehealth: Payer: Self-pay | Admitting: Family Medicine

## 2014-12-29 MED ORDER — HYDROCODONE-ACETAMINOPHEN 10-325 MG PO TABS: 1.0000 | ORAL_TABLET | Freq: Three times a day (TID) | ORAL | Status: DC | PRN

## 2014-12-29 NOTE — Telephone Encounter (Signed)
Spoke with patient and advised per notations  Dr Charlett Blake gave verbal ok. For hydrocodone. Rx printed, signed, and placed at front desk.

## 2014-12-29 NOTE — Telephone Encounter (Signed)
Requesting:HYDROcodone-acetaminophen ([place]) 10-325 MG per tablet Contract on file UDS not on file Last OV 09/26/2014 Last Refill 12/01/2014 #60 no refills  Please Advise  Pharmacy called and stated that they had accidental put 5 refills on his androgel in October. Instructions were from him to follow up in 3-4 months to recheck levels. He has not returned and his next appt is CPE in April. Please advise how to proceed with this incident.

## 2014-12-29 NOTE — Telephone Encounter (Signed)
Caller name: Declan, Mier Relation to pt: self  Call back number: [phone] Pharmacy: [place], [place] [phone] (Phone)    Reason for call:  Pt requesting a refill HYDROcodone-acetaminophen (NORCO) 10-325 MG per tablet

## 2014-12-29 NOTE — Telephone Encounter (Signed)
So his only lab that is pressing is to recheck his testosterone level. So have him come in just to have testosterone checked (nonfasting) now and then we can do everything else in April

## 2014-12-30 ENCOUNTER — Other Ambulatory Visit (INDEPENDENT_AMBULATORY_CARE_PROVIDER_SITE_OTHER): Payer: Self-pay

## 2014-12-30 DIAGNOSIS — E785 Hyperlipidemia, unspecified: Secondary | ICD-10-CM

## 2014-12-30 LAB — LIPID PANEL
Cholesterol: 188 mg/dL (ref 0–200)
HDL: 29.4 mg/dL — ABNORMAL LOW (ref >39.00)
Total CHOL/HDL Ratio: 6
Triglycerides: 410 mg/dL — ABNORMAL HIGH (ref 0.0–149.0)

## 2014-12-30 LAB — LDL CHOLESTEROL, DIRECT: Direct LDL: 125 mg/dL

## 2015-01-02 LAB — TESTOSTERONE, FREE, TOTAL, SHBG
SEX HORMONE BINDING: 21 nmol/L (ref 10–50)
TESTOSTERONE-% FREE: 2.5 % (ref 1.6–2.9)
Testosterone, Free: 66.6 pg/mL (ref 47.0–244.0)
Testosterone: 270 ng/dL — ABNORMAL LOW (ref 300–890)

## 2015-01-31 ENCOUNTER — Telehealth: Payer: Self-pay | Admitting: Family Medicine

## 2015-01-31 NOTE — Telephone Encounter (Signed)
Caller name: Jahkai, Yandell Relation to pt: SELF  Call back number:[phone]   Reason for call:  Pt requesting a RX for HYDROcodone-acetaminophen (NORCO) 10-325 MG per tablet

## 2015-01-31 NOTE — Telephone Encounter (Signed)
He can have a refill on his Hydrocodone with same strength, same sig, same number. I believe we do need a contract and a UDS though

## 2015-02-01 MED ORDER — HYDROCODONE-ACETAMINOPHEN 10-325 MG PO TABS: 1.0000 | ORAL_TABLET | Freq: Three times a day (TID) | ORAL | Status: DC | PRN

## 2015-02-01 NOTE — Telephone Encounter (Signed)
Rx printed and contract printed.  Placed in Dr. Charlett Blake red folder for review and signature.

## 2015-02-02 NOTE — Telephone Encounter (Signed)
Called the patient to informed to pickup hardcopy of hydrocodone at the front desk, but to complete contract and do UDS urine sample as well.  The patient did agree and understand instructions.

## 2015-02-03 ENCOUNTER — Telehealth: Payer: Self-pay

## 2015-02-03 NOTE — Telephone Encounter (Signed)
Noted. The same indicated on controlled substance contract.

## 2015-02-03 NOTE — Telephone Encounter (Signed)
Called and left a message for call back.  When patient calls back, please verify preferred pharmacy for all controlled substance medications.  Per his contract, he is only allowed to use ONE pharmacy for filling ALL controlled substance prescriptions.

## 2015-02-03 NOTE — Telephone Encounter (Signed)
[facility]

## 2015-02-15 ENCOUNTER — Other Ambulatory Visit: Payer: Self-pay | Admitting: Family Medicine

## 2015-02-15 NOTE — Telephone Encounter (Signed)
Caller name: Severiano Relation to pt: self Call back number: [phone] Pharmacy: [facility]  Reason for call:   Requesting testosterone refill

## 2015-02-16 MED ORDER — TESTOSTERONE 50 MG/5GM (1%) TD GEL: TRANSDERMAL | Status: DC

## 2015-02-16 NOTE — Telephone Encounter (Signed)
Faxed hardcopy for testosterone to [facility] :[place]

## 2015-03-02 ENCOUNTER — Telehealth: Payer: Self-pay | Admitting: Family Medicine

## 2015-03-02 NOTE — Telephone Encounter (Signed)
Caller name: Jarryn Relation to pt: self Call back number: [phone] Pharmacy:  Reason for call:   Requesting hydrocodone refill

## 2015-03-02 NOTE — Telephone Encounter (Signed)
Last refill for Hydrocodone was 02/01/15 #60 with 0 refills. Last Office Visit 09/26/14 Next scheduled office visit 04/03/15 Advise on refill please in the absence of Dr. Charlett Blake. Contract signed on 4/30/1 This patient was informed on 02/02/15 to do UDS and Contract.

## 2015-03-02 NOTE — Telephone Encounter (Signed)
Refill x1 

## 2015-03-03 MED ORDER — HYDROCODONE-ACETAMINOPHEN 10-325 MG PO TABS: 1.0000 | ORAL_TABLET | Freq: Three times a day (TID) | ORAL | Status: DC | PRN

## 2015-03-03 NOTE — Telephone Encounter (Signed)
Called the patient informed to pickup hardcopy at the front desk. 

## 2015-03-03 NOTE — Telephone Encounter (Signed)
Printed as instructed and on counter for signature

## 2015-03-16 ENCOUNTER — Telehealth: Payer: Self-pay | Admitting: Family Medicine

## 2015-03-16 NOTE — Telephone Encounter (Signed)
Pre visit letter sent  °

## 2015-04-03 ENCOUNTER — Ambulatory Visit (INDEPENDENT_AMBULATORY_CARE_PROVIDER_SITE_OTHER): Payer: Commercial Managed Care - PPO | Admitting: Family Medicine

## 2015-04-03 ENCOUNTER — Encounter: Payer: Self-pay | Admitting: Family Medicine

## 2015-04-03 VITALS — BP 118/78 | HR 84 | Temp 98.1°F | Ht 70.0 in | Wt 179.1 lb

## 2015-04-03 DIAGNOSIS — R030 Elevated blood-pressure reading, without diagnosis of hypertension: Secondary | ICD-10-CM

## 2015-04-03 DIAGNOSIS — E291 Testicular hypofunction: Secondary | ICD-10-CM | POA: Diagnosis not present

## 2015-04-03 DIAGNOSIS — Z0000 Encounter for general adult medical examination without abnormal findings: Secondary | ICD-10-CM

## 2015-04-03 DIAGNOSIS — M542 Cervicalgia: Secondary | ICD-10-CM

## 2015-04-03 DIAGNOSIS — IMO0001 Reserved for inherently not codable concepts without codable children: Secondary | ICD-10-CM

## 2015-04-03 DIAGNOSIS — E785 Hyperlipidemia, unspecified: Secondary | ICD-10-CM

## 2015-04-03 DIAGNOSIS — Z720 Tobacco use: Secondary | ICD-10-CM

## 2015-04-03 DIAGNOSIS — R52 Pain, unspecified: Secondary | ICD-10-CM | POA: Diagnosis not present

## 2015-04-03 DIAGNOSIS — E782 Mixed hyperlipidemia: Secondary | ICD-10-CM | POA: Diagnosis not present

## 2015-04-03 DIAGNOSIS — R7989 Other specified abnormal findings of blood chemistry: Secondary | ICD-10-CM

## 2015-04-03 MED ORDER — NAPROXEN 500 MG PO TABS: 500.0000 mg | ORAL_TABLET | Freq: Two times a day (BID) | ORAL | Status: DC

## 2015-04-03 MED ORDER — FENOFIBRATE 145 MG PO TABS: 145.0000 mg | ORAL_TABLET | Freq: Every day | ORAL | Status: DC

## 2015-04-03 MED ORDER — HYDROCODONE-ACETAMINOPHEN 10-325 MG PO TABS: 1.0000 | ORAL_TABLET | Freq: Three times a day (TID) | ORAL | Status: DC | PRN

## 2015-04-03 MED ORDER — TESTOSTERONE 50 MG/5GM (1%) TD GEL: TRANSDERMAL | Status: DC

## 2015-04-03 NOTE — Progress Notes (Signed)
Pre visit review using our clinic review tool, if applicable. No additional management support is needed unless otherwise documented below in the visit note. 

## 2015-04-03 NOTE — Patient Instructions (Signed)
Preventive Care for Adults A healthy lifestyle and preventive care can promote health and wellness. Preventive health guidelines for men include the following key practices:  A routine yearly physical is a good way to check with your health care provider about your health and preventative screening. It is a chance to share any concerns and updates on your health and to receive a thorough exam.  Visit your dentist for a routine exam and preventative care every 6 months. Brush your teeth twice a day and floss once a day. Good oral hygiene prevents tooth decay and gum disease.  The frequency of eye exams is based on your age, health, family medical history, use of contact lenses, and other factors. Follow your health care provider's recommendations for frequency of eye exams.  Eat a healthy diet. Foods such as vegetables, fruits, whole grains, low-fat dairy products, and lean protein foods contain the nutrients you need without too many calories. Decrease your intake of foods high in solid fats, added sugars, and salt. Eat the right amount of calories for you.Get information about a proper diet from your health care provider, if necessary.  Regular physical exercise is one of the most important things you can do for your health. Most adults should get at least 150 minutes of moderate-intensity exercise (any activity that increases your heart rate and causes you to sweat) each week. In addition, most adults need muscle-strengthening exercises on 2 or more days a week.  Maintain a healthy weight. The body mass index (BMI) is a screening tool to identify possible weight problems. It provides an estimate of body fat based on height and weight. Your health care provider can find your BMI and can help you achieve or maintain a healthy weight.For adults 20 years and older:  A BMI below 18.5 is considered underweight.  A BMI of 18.5 to 24.9 is normal.  A BMI of 25 to 29.9 is considered overweight.  A BMI  of 30 and above is considered obese.  Maintain normal blood lipids and cholesterol levels by exercising and minimizing your intake of saturated fat. Eat a balanced diet with plenty of fruit and vegetables. Blood tests for lipids and cholesterol should begin at age 50 and be repeated every 5 years. If your lipid or cholesterol levels are high, you are over 50, or you are at high risk for heart disease, you may need your cholesterol levels checked more frequently.Ongoing high lipid and cholesterol levels should be treated with medicines if diet and exercise are not working.  If you smoke, find out from your health care provider how to quit. If you do not use tobacco, do not start.  Lung cancer screening is recommended for adults aged 73-80 years who are at high risk for developing lung cancer because of a history of smoking. A yearly low-dose CT scan of the lungs is recommended for people who have at least a 30-pack-year history of smoking and are a current smoker or have quit within the past 15 years. A pack year of smoking is smoking an average of 1 pack of cigarettes a day for 1 year (for example: 1 pack a day for 30 years or 2 packs a day for 15 years). Yearly screening should continue until the smoker has stopped smoking for at least 15 years. Yearly screening should be stopped for people who develop a health problem that would prevent them from having lung cancer treatment.  If you choose to drink alcohol, do not have more than  2 drinks per day. One drink is considered to be 12 ounces (355 mL) of beer, 5 ounces (148 mL) of wine, or 1.5 ounces (44 mL) of liquor.  Avoid use of street drugs. Do not share needles with anyone. Ask for help if you need support or instructions about stopping the use of drugs.  High blood pressure causes heart disease and increases the risk of stroke. Your blood pressure should be checked at least every 1-2 years. Ongoing high blood pressure should be treated with  medicines, if weight loss and exercise are not effective.  If you are 45-79 years old, ask your health care provider if you should take aspirin to prevent heart disease.  Diabetes screening involves taking a blood sample to check your fasting blood sugar level. This should be done once every 3 years, after age 45, if you are within normal weight and without risk factors for diabetes. Testing should be considered at a younger age or be carried out more frequently if you are overweight and have at least 1 risk factor for diabetes.  Colorectal cancer can be detected and often prevented. Most routine colorectal cancer screening begins at the age of 50 and continues through age 75. However, your health care provider may recommend screening at an earlier age if you have risk factors for colon cancer. On a yearly basis, your health care provider may provide home test kits to check for hidden blood in the stool. Use of a small camera at the end of a tube to directly examine the colon (sigmoidoscopy or colonoscopy) can detect the earliest forms of colorectal cancer. Talk to your health care provider about this at age 50, when routine screening begins. Direct exam of the colon should be repeated every 5-10 years through age 75, unless early forms of precancerous polyps or small growths are found.  People who are at an increased risk for hepatitis B should be screened for this virus. You are considered at high risk for hepatitis B if:  You were born in a country where hepatitis B occurs often. Talk with your health care provider about which countries are considered high risk.  Your parents were born in a high-risk country and you have not received a shot to protect against hepatitis B (hepatitis B vaccine).  You have HIV or AIDS.  You use needles to inject street drugs.  You live with, or have sex with, someone who has hepatitis B.  You are a man who has sex with other men (MSM).  You get hemodialysis  treatment.  You take certain medicines for conditions such as cancer, organ transplantation, and autoimmune conditions.  Hepatitis C blood testing is recommended for all people born from 1945 through 1965 and any individual with known risks for hepatitis C.  Practice safe sex. Use condoms and avoid high-risk sexual practices to reduce the spread of sexually transmitted infections (STIs). STIs include gonorrhea, chlamydia, syphilis, trichomonas, herpes, HPV, and human immunodeficiency virus (HIV). Herpes, HIV, and HPV are viral illnesses that have no cure. They can result in disability, cancer, and death.  If you are at risk of being infected with HIV, it is recommended that you take a prescription medicine daily to prevent HIV infection. This is called preexposure prophylaxis (PrEP). You are considered at risk if:  You are a man who has sex with other men (MSM) and have other risk factors.  You are a heterosexual man, are sexually active, and are at increased risk for HIV infection.    You take drugs by injection.  You are sexually active with a partner who has HIV.  Talk with your health care provider about whether you are at high risk of being infected with HIV. If you choose to begin PrEP, you should first be tested for HIV. You should then be tested every 3 months for as long as you are taking PrEP.  A one-time screening for abdominal aortic aneurysm (AAA) and surgical repair of large AAAs by ultrasound are recommended for men ages 32 to 67 years who are current or former smokers.  Healthy men should no longer receive prostate-specific antigen (PSA) blood tests as part of routine cancer screening. Talk with your health care provider about prostate cancer screening.  Testicular cancer screening is not recommended for adult males who have no symptoms. Screening includes self-exam, a health care provider exam, and other screening tests. Consult with your health care provider about any symptoms  you have or any concerns you have about testicular cancer.  Use sunscreen. Apply sunscreen liberally and repeatedly throughout the day. You should seek shade when your shadow is shorter than you. Protect yourself by wearing long sleeves, pants, a wide-brimmed hat, and sunglasses year round, whenever you are outdoors.  Once a month, do a whole-body skin exam, using a mirror to look at the skin on your back. Tell your health care provider about new moles, moles that have irregular borders, moles that are larger than a pencil eraser, or moles that have changed in shape or color.  Stay current with required vaccines (immunizations).  Influenza vaccine. All adults should be immunized every year.  Tetanus, diphtheria, and acellular pertussis (Td, Tdap) vaccine. An adult who has not previously received Tdap or who does not know his vaccine status should receive 1 dose of Tdap. This initial dose should be followed by tetanus and diphtheria toxoids (Td) booster doses every 10 years. Adults with an unknown or incomplete history of completing a 3-dose immunization series with Td-containing vaccines should begin or complete a primary immunization series including a Tdap dose. Adults should receive a Td booster every 10 years.  Varicella vaccine. An adult without evidence of immunity to varicella should receive 2 doses or a second dose if he has previously received 1 dose.  Human papillomavirus (HPV) vaccine. Males aged 68-21 years who have not received the vaccine previously should receive the 3-dose series. Males aged 22-26 years may be immunized. Immunization is recommended through the age of 6 years for any male who has sex with males and did not get any or all doses earlier. Immunization is recommended for any person with an immunocompromised condition through the age of 49 years if he did not get any or all doses earlier. During the 3-dose series, the second dose should be obtained 4-8 weeks after the first  dose. The third dose should be obtained 24 weeks after the first dose and 16 weeks after the second dose.  Zoster vaccine. One dose is recommended for adults aged 50 years or older unless certain conditions are present.  Measles, mumps, and rubella (MMR) vaccine. Adults born before [birth year] generally are considered immune to measles and mumps. Adults born in [birth year] or later should have 1 or more doses of MMR vaccine unless there is a contraindication to the vaccine or there is laboratory evidence of immunity to each of the three diseases. A routine second dose of MMR vaccine should be obtained at least 28 days after the first dose for students attending postsecondary  schools, health care workers, or international travelers. People who received inactivated measles vaccine or an unknown type of measles vaccine during 1963-1967 should receive 2 doses of MMR vaccine. People who received inactivated mumps vaccine or an unknown type of mumps vaccine before 1979 and are at high risk for mumps infection should consider immunization with 2 doses of MMR vaccine. Unvaccinated health care workers born before 1957 who lack laboratory evidence of measles, mumps, or rubella immunity or laboratory confirmation of disease should consider measles and mumps immunization with 2 doses of MMR vaccine or rubella immunization with 1 dose of MMR vaccine.  Pneumococcal 13-valent conjugate (PCV13) vaccine. When indicated, a person who is uncertain of his immunization history and has no record of immunization should receive the PCV13 vaccine. An adult aged 19 years or older who has certain medical conditions and has not been previously immunized should receive 1 dose of PCV13 vaccine. This PCV13 should be followed with a dose of pneumococcal polysaccharide (PPSV23) vaccine. The PPSV23 vaccine dose should be obtained at least 8 weeks after the dose of PCV13 vaccine. An adult aged 19 years or older who has certain medical conditions and  previously received 1 or more doses of PPSV23 vaccine should receive 1 dose of PCV13. The PCV13 vaccine dose should be obtained 1 or more years after the last PPSV23 vaccine dose.  Pneumococcal polysaccharide (PPSV23) vaccine. When PCV13 is also indicated, PCV13 should be obtained first. All adults aged 65 years and older should be immunized. An adult younger than age 65 years who has certain medical conditions should be immunized. Any person who resides in a nursing home or long-term care facility should be immunized. An adult smoker should be immunized. People with an immunocompromised condition and certain other conditions should receive both PCV13 and PPSV23 vaccines. People with human immunodeficiency virus (HIV) infection should be immunized as soon as possible after diagnosis. Immunization during chemotherapy or radiation therapy should be avoided. Routine use of PPSV23 vaccine is not recommended for American Indians, [place] Natives, or people younger than 65 years unless there are medical conditions that require PPSV23 vaccine. When indicated, people who have unknown immunization and have no record of immunization should receive PPSV23 vaccine. One-time revaccination 5 years after the first dose of PPSV23 is recommended for people aged 19-64 years who have chronic kidney failure, nephrotic syndrome, asplenia, or immunocompromised conditions. People who received 1-2 doses of PPSV23 before age 65 years should receive another dose of PPSV23 vaccine at age 65 years or later if at least 5 years have passed since the previous dose. Doses of PPSV23 are not needed for people immunized with PPSV23 at or after age 65 years.  Meningococcal vaccine. Adults with asplenia or persistent complement component deficiencies should receive 2 doses of quadrivalent meningococcal conjugate (MenACWY-D) vaccine. The doses should be obtained at least 2 months apart. Microbiologists working with certain meningococcal bacteria,  military recruits, people at risk during an outbreak, and people who travel to or live in countries with a high rate of meningitis should be immunized. A first-year college student up through age 21 years who is living in a residence hall should receive a dose if he did not receive a dose on or after his 16th birthday. Adults who have certain high-risk conditions should receive one or more doses of vaccine.  Hepatitis A vaccine. Adults who wish to be protected from this disease, have certain high-risk conditions, work with hepatitis A-infected animals, work in hepatitis A research labs, or   travel to or work in countries with a high rate of hepatitis A should be immunized. Adults who were previously unvaccinated and who anticipate close contact with an international adoptee during the first 60 days after arrival in the [place] from a country with a high rate of hepatitis A should be immunized.  Hepatitis B vaccine. Adults should be immunized if they wish to be protected from this disease, have certain high-risk conditions, may be exposed to blood or other infectious body fluids, are household contacts or sex partners of hepatitis B positive people, are clients or workers in certain care facilities, or travel to or work in countries with a high rate of hepatitis B.  Haemophilus influenzae type b (Hib) vaccine. A previously unvaccinated person with asplenia or sickle cell disease or having a scheduled splenectomy should receive 1 dose of Hib vaccine. Regardless of previous immunization, a recipient of a hematopoietic stem cell transplant should receive a 3-dose series 6-12 months after his successful transplant. Hib vaccine is not recommended for adults with HIV infection. Preventive Service / Frequency Ages 52 to 17  Blood pressure check.** / Every 1 to 2 years.  Lipid and cholesterol check.** / Every 5 years beginning at age 69.  Hepatitis C blood test.** / For any individual with known risks for  hepatitis C.  Skin self-exam. / Monthly.  Influenza vaccine. / Every year.  Tetanus, diphtheria, and acellular pertussis (Tdap, Td) vaccine.** / Consult your health care provider. 1 dose of Td every 10 years.  Varicella vaccine.** / Consult your health care provider.  HPV vaccine. / 3 doses over 6 months, if 72 or younger.  Measles, mumps, rubella (MMR) vaccine.** / You need at least 1 dose of MMR if you were born in 1957 or later. You may also need a second dose.  Pneumococcal 13-valent conjugate (PCV13) vaccine.** / Consult your health care provider.  Pneumococcal polysaccharide (PPSV23) vaccine.** / 1 to 2 doses if you smoke cigarettes or if you have certain conditions.  Meningococcal vaccine.** / 1 dose if you are age 35 to 60 years and a Market researcher living in a residence hall, or have one of several medical conditions. You may also need additional booster doses.  Hepatitis A vaccine.** / Consult your health care provider.  Hepatitis B vaccine.** / Consult your health care provider.  Haemophilus influenzae type b (Hib) vaccine.** / Consult your health care provider. Ages 35 to 8  Blood pressure check.** / Every 1 to 2 years.  Lipid and cholesterol check.** / Every 5 years beginning at age 57.  Lung cancer screening. / Every year if you are aged 44-80 years and have a 30-pack-year history of smoking and currently smoke or have quit within the past 15 years. Yearly screening is stopped once you have quit smoking for at least 15 years or develop a health problem that would prevent you from having lung cancer treatment.  Fecal occult blood test (FOBT) of stool. / Every year beginning at age 55 and continuing until age 73. You may not have to do this test if you get a colonoscopy every 10 years.  Flexible sigmoidoscopy** or colonoscopy.** / Every 5 years for a flexible sigmoidoscopy or every 10 years for a colonoscopy beginning at age 28 and continuing until age  1.  Hepatitis C blood test.** / For all people born from [birth year] through 1965 and any individual with known risks for hepatitis C.  Skin self-exam. / Monthly.  Influenza vaccine. / Every  year.  Tetanus, diphtheria, and acellular pertussis (Tdap/Td) vaccine.** / Consult your health care provider. 1 dose of Td every 10 years.  Varicella vaccine.** / Consult your health care provider.  Zoster vaccine.** / 1 dose for adults aged 53 years or older.  Measles, mumps, rubella (MMR) vaccine.** / You need at least 1 dose of MMR if you were born in 1957 or later. You may also need a second dose.  Pneumococcal 13-valent conjugate (PCV13) vaccine.** / Consult your health care provider.  Pneumococcal polysaccharide (PPSV23) vaccine.** / 1 to 2 doses if you smoke cigarettes or if you have certain conditions.  Meningococcal vaccine.** / Consult your health care provider.  Hepatitis A vaccine.** / Consult your health care provider.  Hepatitis B vaccine.** / Consult your health care provider.  Haemophilus influenzae type b (Hib) vaccine.** / Consult your health care provider. Ages 77 and over  Blood pressure check.** / Every 1 to 2 years.  Lipid and cholesterol check.**/ Every 5 years beginning at age 85.  Lung cancer screening. / Every year if you are aged 55-80 years and have a 30-pack-year history of smoking and currently smoke or have quit within the past 15 years. Yearly screening is stopped once you have quit smoking for at least 15 years or develop a health problem that would prevent you from having lung cancer treatment.  Fecal occult blood test (FOBT) of stool. / Every year beginning at age 33 and continuing until age 11. You may not have to do this test if you get a colonoscopy every 10 years.  Flexible sigmoidoscopy** or colonoscopy.** / Every 5 years for a flexible sigmoidoscopy or every 10 years for a colonoscopy beginning at age 28 and continuing until age 73.  Hepatitis C blood  test.** / For all people born from [birth year] through 1965 and any individual with known risks for hepatitis C.  Abdominal aortic aneurysm (AAA) screening.** / A one-time screening for ages 50 to 27 years who are current or former smokers.  Skin self-exam. / Monthly.  Influenza vaccine. / Every year.  Tetanus, diphtheria, and acellular pertussis (Tdap/Td) vaccine.** / 1 dose of Td every 10 years.  Varicella vaccine.** / Consult your health care provider.  Zoster vaccine.** / 1 dose for adults aged 34 years or older.  Pneumococcal 13-valent conjugate (PCV13) vaccine.** / Consult your health care provider.  Pneumococcal polysaccharide (PPSV23) vaccine.** / 1 dose for all adults aged 63 years and older.  Meningococcal vaccine.** / Consult your health care provider.  Hepatitis A vaccine.** / Consult your health care provider.  Hepatitis B vaccine.** / Consult your health care provider.  Haemophilus influenzae type b (Hib) vaccine.** / Consult your health care provider. **Family history and personal history of risk and conditions may change your health care provider's recommendations. Document Released: 01/14/2002 Document Revised: 11/23/2013 Document Reviewed: 04/15/2011 [facility] Patient Information 2015 [place], [place]. This information is not intended to replace advice given to you by your health care provider. Make sure you discuss any questions you have with your health care provider.

## 2015-04-04 LAB — COMPREHENSIVE METABOLIC PANEL
ALT: 32 U/L (ref 0–53)
AST: 29 U/L (ref 0–37)
Albumin: 4.3 g/dL (ref 3.5–5.2)
Alkaline Phosphatase: 84 U/L (ref 39–117)
BUN: 13 mg/dL (ref 6–23)
CALCIUM: 9.8 mg/dL (ref 8.4–10.5)
CO2: 26 mEq/L (ref 19–32)
CREATININE: 0.89 mg/dL (ref 0.40–1.50)
Chloride: 102 mEq/L (ref 96–112)
GFR: 97.84 mL/min (ref >60.00)
Glucose, Bld: 73 mg/dL (ref 70–99)
Potassium: 3.6 mEq/L (ref 3.5–5.1)
Sodium: 136 mEq/L (ref 135–145)
Total Bilirubin: 0.3 mg/dL (ref 0.2–1.2)
Total Protein: 7.3 g/dL (ref 6.0–8.3)

## 2015-04-04 LAB — CBC
HCT: 43.5 % (ref 39.0–52.0)
Hemoglobin: 15.2 g/dL (ref 13.0–17.0)
MCHC: 34.9 g/dL (ref 30.0–36.0)
MCV: 87.2 fl (ref 78.0–100.0)
Platelets: 207 10*3/uL (ref 150.0–400.0)
RBC: 4.98 Mil/uL (ref 4.22–5.81)
RDW: 13 % (ref 11.5–15.5)
WBC: 9.4 10*3/uL (ref 4.0–10.5)

## 2015-04-04 LAB — LIPID PANEL
Cholesterol: 186 mg/dL (ref 0–200)
HDL: 28.6 mg/dL — ABNORMAL LOW (ref >39.00)
Total CHOL/HDL Ratio: 7

## 2015-04-04 LAB — LDL CHOLESTEROL, DIRECT: Direct LDL: 109 mg/dL

## 2015-04-04 LAB — TESTOSTERONE: Testosterone: 127.44 ng/dL — ABNORMAL LOW (ref 300.00–890.00)

## 2015-04-04 LAB — TSH: TSH: 1.27 u[IU]/mL (ref 0.35–4.50)

## 2015-04-06 ENCOUNTER — Other Ambulatory Visit: Payer: Self-pay | Admitting: Family Medicine

## 2015-04-06 MED ORDER — ATORVASTATIN CALCIUM 10 MG PO TABS: 10.0000 mg | ORAL_TABLET | Freq: Every day | ORAL | Status: DC

## 2015-04-09 ENCOUNTER — Encounter: Payer: Self-pay | Admitting: Family Medicine

## 2015-04-09 NOTE — Assessment & Plan Note (Signed)
Well controlled. Encouraged heart healthy diet such as the DASH diet and exercise as tolerated.  

## 2015-04-09 NOTE — Assessment & Plan Note (Signed)
Patient encouraged to maintain heart healthy diet, regular exercise, adequate sleep. Consider daily probiotics. Take medications as prescribed. Labs ordered and reviewed 

## 2015-04-09 NOTE — Assessment & Plan Note (Signed)
Will add statin, encouraged heart healthy diet, avoid trans fats, minimize simple carbs and saturated fats. Increase exercise as tolerated

## 2015-04-09 NOTE — Assessment & Plan Note (Signed)
Encouraged complete cessation. Discussed need to quit as relates to risk of numerous cancers, cardiac and pulmonary disease as well as neurologic complications. Counseled for greater than 3 minutes 

## 2015-04-09 NOTE — Assessment & Plan Note (Signed)
Low testosterone will increase Androgel to 5 gm topically daily

## 2015-04-09 NOTE — Progress Notes (Signed)
Elijah Marquez  [id number] 05/04/1971 04/09/2015      Progress Note-Follow Up  Subjective  Chief Complaint  Chief Complaint  Patient presents with  . Annual Exam    HPI  Patient is a 44 y.o. male in today for routine medical care. Patient is in today for annual exam. He continues to struggle with neck pain and left shoulder and upper extremity pain. Was released from disability in January and was released from his job shortly thereafter. At present tries to work part-time. He is but continues to struggle with daily. No recent illness. Unfortunately, he also continues to smoke. Denies CP/palp/SOB/HA/congestion/fevers/GI or GU c/o. Taking meds as prescribed  Past Medical History  Diagnosis Date  . Chicken pox as a child  . Osteoarthritis of left knee     and right knee  . Tobacco abuse   . Hyperlipidemia   . Neck pain on left side 08/23/2012  . Anxiety 08/23/2012  . Hyperlipidemia 08/23/2012  . Fatigue   . Anxiety as acute reaction to exceptional stress 09/20/2012  . Back pain 07/29/2013  . Hypogonadism in male 10/02/2014    Past Surgical History  Procedure Laterality Date  . Knee surgery      right knee 2012, left knee 2011 (arthroscopy)  . Wisdom tooth extraction    . Neck surgery  12/08/12    Family History  Problem Relation Age of Onset  . Fibromyalgia Mother   . Coronary artery disease Mother   . Hypertension Mother   . Heart attack Mother   . Heart disease Mother     CAD  . Peripheral vascular disease Mother 23    hemorrhage from vascular procedure  . Fibromyalgia Sister   . Hypertension Sister   . Heart disease Sister   . Other Son     mildly mentally handicap  . Seizures Son   . Lupus Sister   . Emphysema Sister     smoker  . Kidney disease Daughter   . Alcohol abuse Father     History   Social History  . Marital Status: Married    Spouse Name: N/A  . Number of Children: N/A  . Years of Education: N/A   Occupational History  . Not on file.    Social History Main Topics  . Smoking status: Current Every Day Smoker -- 1.50 packs/day for 25 years    Types: Cigarettes  . Smokeless tobacco: Never Used  . Alcohol Use: No  . Drug Use: No  . Sexual Activity:    Partners: Female   Other Topics Concern  . Not on file   Social History Narrative    Current Outpatient Prescriptions on File Prior to Visit  Medication Sig Dispense Refill  . ALPRAZolam (XANAX) 1 MG tablet Take 1 tablet (1 mg total) by mouth 2 (two) times daily as needed for sleep or anxiety. 60 tablet 2  . cyclobenzaprine (FLEXERIL) 10 MG tablet Take 1 tablet (10 mg total) by mouth 3 (three) times daily as needed for muscle spasms. 30 tablet 5   No current facility-administered medications on file prior to visit.    Allergies  Allergen Reactions  . Penicillins Swelling and Rash    Review of Systems  Review of Systems  Constitutional: Negative for fever, chills and malaise/fatigue.  HENT: Negative for congestion, hearing loss and nosebleeds.   Eyes: Negative for discharge.  Respiratory: Negative for cough, sputum production, shortness of breath and wheezing.   Cardiovascular: Negative for chest pain,  palpitations and leg swelling.  Gastrointestinal: Negative for heartburn, nausea, vomiting, abdominal pain, diarrhea, constipation and blood in stool.  Genitourinary: Negative for dysuria, urgency, frequency and hematuria.  Musculoskeletal: Positive for back pain and neck pain. Negative for myalgias and falls.  Skin: Negative for rash.  Neurological: Negative for dizziness, tremors, sensory change, focal weakness, loss of consciousness, weakness and headaches.  Endo/Heme/Allergies: Negative for polydipsia. Does not bruise/bleed easily.  Psychiatric/Behavioral: Negative for depression and suicidal ideas. The patient is not nervous/anxious and does not have insomnia.     Objective  BP 118/78 mmHg  Pulse 84  Temp(Src) 98.1 F (36.7 C) (Oral)  Ht 5\' 10"   (1.778 m)  Wt 179 lb 2 oz (81.251 kg)  BMI 25.70 kg/m2  SpO2 96%  Physical Exam  Physical Exam  Constitutional: He is oriented to person, place, and time and well-developed, well-nourished, and in no distress. No distress.  HENT:  Head: Normocephalic and atraumatic.  Eyes: Conjunctivae are normal.  Neck: Neck supple. No thyromegaly present.  Cardiovascular: Normal rate, regular rhythm and normal heart sounds.   No murmur heard. Pulmonary/Chest: Effort normal and breath sounds normal. No respiratory distress.  Abdominal: He exhibits no distension and no mass. There is no tenderness.  Musculoskeletal: He exhibits no edema.  Neurological: He is alert and oriented to person, place, and time.  Skin: Skin is warm.  Psychiatric: Memory, affect and judgment normal.    Lab Results  Component Value Date   TSH 1.27 04/03/2015   Lab Results  Component Value Date   WBC 9.4 04/03/2015   HGB 15.2 04/03/2015   HCT 43.5 04/03/2015   MCV 87.2 04/03/2015   PLT 207.0 04/03/2015   Lab Results  Component Value Date   CREATININE 0.89 04/03/2015   BUN 13 04/03/2015   NA 136 04/03/2015   K 3.6 04/03/2015   CL 102 04/03/2015   CO2 26 04/03/2015   Lab Results  Component Value Date   ALT 32 04/03/2015   AST 29 04/03/2015   ALKPHOS 84 04/03/2015   BILITOT 0.3 04/03/2015   Lab Results  Component Value Date   CHOL 186 04/03/2015   Lab Results  Component Value Date   HDL 28.60* 04/03/2015   Lab Results  Component Value Date   LDLCALC 119* 03/31/2014   Lab Results  Component Value Date   TRIG * 04/03/2015    504.0 Triglyceride is over 400; calculations on Lipids are invalid.   Lab Results  Component Value Date   CHOLHDL 7 04/03/2015     Assessment & Plan  Elevated BP Well controlled. Encouraged heart healthy diet such as the DASH diet and exercise as tolerated.    Tobacco abuse Encouraged complete cessation. Discussed need to quit as relates to risk of numerous  cancers, cardiac and pulmonary disease as well as neurologic complications. Counseled for greater than 3 minutes   Preventative health care Patient encouraged to maintain heart healthy diet, regular exercise, adequate sleep. Consider daily probiotics. Take medications as prescribed. Labs ordered and reviewed   Neck pain on left side Continues to struggle with pain, was injured on the job and as soon as he was released from disability he was released from his job. He has been trying to work full time.   Hyperlipidemia Will add statin, encouraged heart healthy diet, avoid trans fats, minimize simple carbs and saturated fats. Increase exercise as tolerated

## 2015-04-09 NOTE — Assessment & Plan Note (Signed)
Continues to struggle with pain, was injured on the job and as soon as he was released from disability he was released from his job. He has been trying to work full time.

## 2015-04-18 ENCOUNTER — Telehealth: Payer: Self-pay | Admitting: Family Medicine

## 2015-04-18 DIAGNOSIS — E785 Hyperlipidemia, unspecified: Secondary | ICD-10-CM

## 2015-04-18 MED ORDER — PRAVASTATIN SODIUM 20 MG PO TABS: 20.0000 mg | ORAL_TABLET | Freq: Every day | ORAL | Status: DC

## 2015-04-18 NOTE — Telephone Encounter (Signed)
Change to Pravachol 20 mg one by mouth daily at bedtime #30 and 3 refills Arrange a AST, ALT, FLP in 6 weeks

## 2015-04-18 NOTE — Telephone Encounter (Signed)
Patient was put on Lipitor at last office visit with Dr. Charlett Blake.  The patient called this morning (04/18/15) to inform has started having leg pain (both) over the past couple weeks.  He would like to change medications, states his mother had the same problem with Lipitor.  Advise Call back number [phone], ok to leave detailed msg. Per patient.

## 2015-04-18 NOTE — Telephone Encounter (Signed)
Sent in pravachol to [facility].  Called the patient left msg. To call back to schedule lab appointment. Put order in for labs.

## 2015-05-02 ENCOUNTER — Telehealth: Payer: Self-pay | Admitting: Family Medicine

## 2015-05-02 MED ORDER — TESTOSTERONE 50 MG/5GM (1%) TD GEL: TRANSDERMAL | Status: DC

## 2015-05-02 MED ORDER — HYDROCODONE-ACETAMINOPHEN 10-325 MG PO TABS: 1.0000 | ORAL_TABLET | Freq: Three times a day (TID) | ORAL | Status: DC | PRN

## 2015-05-02 NOTE — Telephone Encounter (Signed)
Patient informed to pickup hardcopy for Hydrocodone and Testosterone at the front desk.

## 2015-05-02 NOTE — Telephone Encounter (Signed)
OK to refill both meds with same sig

## 2015-05-02 NOTE — Telephone Encounter (Signed)
Printed and on counter for signature. 

## 2015-05-02 NOTE — Telephone Encounter (Signed)
Relation to pt: self  Call back [place]:  [place], [place] [phone] (Phone) [phone] (Fax)          Reason for call:  Pt requesting a refill HYDROcodone-acetaminophen (NORCO) 10-325 MG per tablet and testosterone (ANDROGEL) 50 MG/5GM (1%) GEL

## 2015-05-02 NOTE — Telephone Encounter (Signed)
Last office visit and last refill on both medications 04/03/15.

## 2015-05-30 ENCOUNTER — Other Ambulatory Visit (INDEPENDENT_AMBULATORY_CARE_PROVIDER_SITE_OTHER): Payer: Commercial Managed Care - PPO

## 2015-05-30 ENCOUNTER — Other Ambulatory Visit: Payer: Self-pay | Admitting: Family Medicine

## 2015-05-30 DIAGNOSIS — E785 Hyperlipidemia, unspecified: Secondary | ICD-10-CM | POA: Diagnosis not present

## 2015-05-30 DIAGNOSIS — R52 Pain, unspecified: Secondary | ICD-10-CM

## 2015-05-30 MED ORDER — PRAVASTATIN SODIUM 20 MG PO TABS: 20.0000 mg | ORAL_TABLET | Freq: Every day | ORAL | Status: DC

## 2015-05-30 NOTE — Telephone Encounter (Signed)
Requesting:  Hydrocodone and Testosterone Contract  Signed on 03/31/14 UDS  None Last OV  04/03/15 Last Refill  Hydrocodone #60 with 0 refills on 05/02/15                    Testosterone 5g with 0 refills on 05/02/15 Please Advise

## 2015-05-30 NOTE — Telephone Encounter (Signed)
Patient informed request will be forwarded to PCP to address on Thursday.

## 2015-05-30 NOTE — Telephone Encounter (Signed)
Will sign on Thursday for pick up on Friday

## 2015-05-30 NOTE — Telephone Encounter (Signed)
Caller name: Demon Relationship to patient: SELF  Can be [phone] Pharmacy: [facility]   Reason for call: HYDROCODONE  TESTOSTERONE AND NAPROXEN  AND HIS CHOLESTEROL MEDICINE

## 2015-05-30 NOTE — Telephone Encounter (Signed)
Neither are due until 06/02/15. Will leave for Dr. Charlett Blake to refill when she returns Thursday.

## 2015-05-31 LAB — HEPATIC FUNCTION PANEL
ALT: 28 U/L (ref 0–53)
AST: 22 U/L (ref 0–37)
Albumin: 4.3 g/dL (ref 3.5–5.2)
Alkaline Phosphatase: 70 U/L (ref 39–117)
BILIRUBIN TOTAL: 0.5 mg/dL (ref 0.2–1.2)
Bilirubin, Direct: 0 mg/dL (ref 0.0–0.3)
Total Protein: 7 g/dL (ref 6.0–8.3)

## 2015-05-31 LAB — LIPID PANEL
Cholesterol: 137 mg/dL (ref 0–200)
HDL: 31.5 mg/dL — ABNORMAL LOW (ref >39.00)
LDL CALC: 72 mg/dL (ref 0–99)
NonHDL: 105.5
Total CHOL/HDL Ratio: 4
Triglycerides: 167 mg/dL — ABNORMAL HIGH (ref 0.0–149.0)
VLDL: 33.4 mg/dL (ref 0.0–40.0)

## 2015-06-01 MED ORDER — HYDROCODONE-ACETAMINOPHEN 10-325 MG PO TABS: 1.0000 | ORAL_TABLET | Freq: Three times a day (TID) | ORAL | Status: DC | PRN

## 2015-06-01 MED ORDER — TESTOSTERONE 50 MG/5GM (1%) TD GEL: TRANSDERMAL | Status: DC

## 2015-06-01 NOTE — Telephone Encounter (Signed)
Printed hardcopy of both prescriptions and on counter for signature.  Patient informed and will pickup late this afternoon or on Friday.,

## 2015-06-18 ENCOUNTER — Other Ambulatory Visit: Payer: Self-pay | Admitting: Physician Assistant

## 2015-06-19 NOTE — Telephone Encounter (Signed)
Filled once in PCP absence. Will defer further fills to her.

## 2015-06-29 ENCOUNTER — Telehealth: Payer: Self-pay | Admitting: Family Medicine

## 2015-06-29 MED ORDER — HYDROCODONE-ACETAMINOPHEN 10-325 MG PO TABS: 1.0000 | ORAL_TABLET | Freq: Three times a day (TID) | ORAL | Status: DC | PRN

## 2015-06-29 MED ORDER — TESTOSTERONE 50 MG/5GM (1%) TD GEL: TRANSDERMAL | Status: DC

## 2015-06-29 NOTE — Telephone Encounter (Signed)
Printed and on counter for both signature.

## 2015-06-29 NOTE — Telephone Encounter (Signed)
Caller name:Leanthony Relationship to patient:self Can be reached:[phone] Pharmacy:  Reason for call: Hydrocodone and  testosterone

## 2015-06-29 NOTE — Telephone Encounter (Signed)
OK to refill Hydrocodone, same sig, same number same strength but does need to have a UDS

## 2015-06-29 NOTE — Telephone Encounter (Signed)
Called the patient informed both prescriptions are printed on one sheet and would have to pickup hardcopy at our office.   Also informed he would need to do a UDS before actually receiving the hardcopy.  The patient did understand and agreed to do.

## 2015-06-29 NOTE — Telephone Encounter (Signed)
Requesting: Hydrocodone Contract signed on 03/31/14 UDS  none Last OV  04/03/15 Last Refill  #60 0n 06/01/15   Please Advise

## 2015-07-31 ENCOUNTER — Ambulatory Visit (INDEPENDENT_AMBULATORY_CARE_PROVIDER_SITE_OTHER): Payer: Commercial Managed Care - PPO | Admitting: Family Medicine

## 2015-07-31 ENCOUNTER — Encounter: Payer: Self-pay | Admitting: Family Medicine

## 2015-07-31 VITALS — BP 130/80 | HR 69 | Temp 98.5°F | Ht 70.0 in | Wt 172.1 lb

## 2015-07-31 DIAGNOSIS — I70219 Atherosclerosis of native arteries of extremities with intermittent claudication, unspecified extremity: Secondary | ICD-10-CM

## 2015-07-31 DIAGNOSIS — E785 Hyperlipidemia, unspecified: Secondary | ICD-10-CM | POA: Diagnosis not present

## 2015-07-31 DIAGNOSIS — Z720 Tobacco use: Secondary | ICD-10-CM | POA: Diagnosis not present

## 2015-07-31 DIAGNOSIS — I739 Peripheral vascular disease, unspecified: Secondary | ICD-10-CM

## 2015-07-31 DIAGNOSIS — M1712 Unilateral primary osteoarthritis, left knee: Secondary | ICD-10-CM

## 2015-07-31 DIAGNOSIS — E782 Mixed hyperlipidemia: Secondary | ICD-10-CM | POA: Diagnosis not present

## 2015-07-31 DIAGNOSIS — M179 Osteoarthritis of knee, unspecified: Secondary | ICD-10-CM

## 2015-07-31 DIAGNOSIS — E291 Testicular hypofunction: Secondary | ICD-10-CM

## 2015-07-31 MED ORDER — TESTOSTERONE 50 MG/5GM (1%) TD GEL: TRANSDERMAL | Status: DC

## 2015-07-31 MED ORDER — ASPIRIN-DIPYRIDAMOLE ER 25-200 MG PO CP12: 1.0000 | ORAL_CAPSULE | Freq: Two times a day (BID) | ORAL | Status: DC

## 2015-07-31 MED ORDER — HYDROCODONE-ACETAMINOPHEN 10-325 MG PO TABS: 1.0000 | ORAL_TABLET | Freq: Three times a day (TID) | ORAL | Status: DC | PRN

## 2015-07-31 MED ORDER — NICOTINE 14 MG/24HR TD PT24: 14.0000 mg | MEDICATED_PATCH | Freq: Every day | TRANSDERMAL | Status: DC

## 2015-07-31 MED ORDER — NICOTINE 21 MG/24HR TD PT24: 21.0000 mg | MEDICATED_PATCH | Freq: Every day | TRANSDERMAL | Status: DC

## 2015-07-31 MED ORDER — NICOTINE 7 MG/24HR TD PT24: 7.0000 mg | MEDICATED_PATCH | Freq: Every day | TRANSDERMAL | Status: DC

## 2015-07-31 NOTE — Patient Instructions (Signed)
Intermittent Claudication Blockage of leg arteries results from poor circulation of blood in the leg arteries. This produces an aching, tired, and sometimes burning pain in the legs that is brought on by exercise and made better by rest. Claudication refers to the limping that happens from leg cramps. It is also referred to as Vaso-occlusive disease of the legs, arterial insufficiency of the legs, recurrent leg pain, recurrent leg cramping and calf pain with exercise.  CAUSES  This condition is due to narrowing or blockage of the arteries (muscular vessels which carry blood away from the heart and around the body). Blockage of arteries can occur anywhere in the body. If they occur in the heart, a person may experience angina (chest pain) or even a heart attack. If they occur in the neck or the brain, a person may have a stroke. Intermittent claudication is when the blockage occurs in the legs, most commonly in the calf or the foot.  Atherosclerosis, or blockage of arteries, can occur for many reasons. Some of these are smoking, diabetes, and high cholesterol. SYMPTOMS  Intermittent claudication may occur in both legs, and it often continues to get worse over time. However, some people complain only of weakness in the legs when walking, or a feeling of "tiredness" in the buttocks. Impotence (not able to have an erection) is an occasional complaint in men. Pain while resting is uncommon.  WHAT TO EXPECT AT YOUR HEALTH CARE PROVIDER'S OFFICE: Your medical history will be asked for and a physical examination will be performed. Medical history questions documenting claudication in detail may include:   Time pattern  Do you have leg cramps at night (nocturnal cramps)?  How often does leg pain with cramping occur?  Is it getting worse?  What is the quality of the pain?  Is the pain sharp?  Is there an aching pain with the cramps?  Aggravating factors  Is it worse after you exercise?  Is it  worse after you are standing for a while?  Do you smoke? How much?  Do you drink alcohol? How much?  Are you diabetic? How well is your blood sugar controlled?  Other  What other symptoms are also present?  Has there been impotence (men)?  Is there pain in the back?  Is there a darkening of the skin of the legs, feet or toes?  Is there weakness or paralysis of the legs? The physical examination may include evaluation of the femoral pulse (in the groin) and the other areas where the pulse can be felt in the legs. DIAGNOSIS  Diagnostic tests that may be performed include:  Blood pressure measured in arms and legs for comparison.  Doppler ultrasonography on the legs and the heart.  Duplex Doppler/ultrasound exam of extremity to visualize arterial blood flow.  ECG- to evaluate the activity of your heart.  Aortography- to visualize blockages in your arteries. TREATMENT Surgical treatment may be suggested if claudication interferes with the patient's activities or work, and if the diseased arteries do not seem to be improving after treatment. Be aware that this condition can worsen over time and you should carefully monitor your condition. HOME CARE INSTRUCTIONS  Talk to your caregiver about the cause of your leg cramping and about what to do at home to relieve it.  A healthy diet is important to lessen the likeliness of atherosclerosis.  A program of daily walking for short periods, and stopping for pain or cramping, may help improve function.  It is important to   stop smoking.  Avoid putting hot or cold items on legs.  Avoid tight shoes. SEEK MEDICAL CARE IF: There are many other causes of leg pain such as arthritis or low blood potassium. However, some causes of leg pain may be life threatening such as a blood clot in the legs. Seek medical attention if you have:  Leg pain that does not go away.  Legs that may be red, hot or swollen.  Ulcers or sores appear on your  ankle or foot.  Any chest pain or shortness of breath accompanying leg pain.  Diabetes.  You are pregnant. SEEK IMMEDIATE MEDICAL CARE IF:   Your leg pain becomes severe or will not go away.  Your foot turns blue or a dark color.  Your leg becomes red, hot or swollen or you develop a fever over 102F.  Any chest pain or shortness of breath accompanying leg pain. MAKE SURE YOU:   Understand these instructions.  Will watch your condition.  Will get help right away if you are not doing well or get worse. Document Released: 09/20/2004 Document Revised: 02/10/2012 Document Reviewed: 02/24/2014 Exit[employer] Patient Information 2015 [employer]. This information is not intended to replace advice given to you by your health care provider. Make sure you discuss any questions you have with your health care provider.  

## 2015-07-31 NOTE — Progress Notes (Signed)
Pre visit review using our clinic review tool, if applicable. No additional management support is needed unless otherwise documented below in the visit note. 

## 2015-08-01 ENCOUNTER — Telehealth: Payer: Self-pay | Admitting: Family Medicine

## 2015-08-01 ENCOUNTER — Telehealth: Payer: Self-pay | Admitting: *Deleted

## 2015-08-01 NOTE — Telephone Encounter (Signed)
PA for Androgel initiated. Awaiting determination. JG//CMA

## 2015-08-01 NOTE — Telephone Encounter (Signed)
error 

## 2015-08-02 ENCOUNTER — Encounter: Payer: Self-pay | Admitting: Family Medicine

## 2015-08-02 NOTE — Telephone Encounter (Signed)
Approved through 07/31/2016. Approval letter sent for scanning. JG//CMA

## 2015-08-13 ENCOUNTER — Encounter: Payer: Self-pay | Admitting: Family Medicine

## 2015-08-13 DIAGNOSIS — I739 Peripheral vascular disease, unspecified: Secondary | ICD-10-CM

## 2015-08-13 DIAGNOSIS — M48062 Spinal stenosis, lumbar region with neurogenic claudication: Secondary | ICD-10-CM | POA: Insufficient documentation

## 2015-08-13 HISTORY — DX: Peripheral vascular disease, unspecified: I73.9

## 2015-08-13 NOTE — Assessment & Plan Note (Signed)
Is ready to try again, is given rx for nicotine patches. Encouraged complete cessation. Discussed need to quit as relates to risk of numerous cancers, cardiac and pulmonary disease as well as neurologic complications. Counseled for greater than 3 minutes

## 2015-08-13 NOTE — Assessment & Plan Note (Addendum)
Encouraged smoking cessation, started on Aggrenox and referred to vascular surgery for further intervention

## 2015-08-13 NOTE — Progress Notes (Signed)
Subjective:    Patient ID: Elijah Marquez, male    DOB: 05/21/1971, 45 y.o.   MRN: [id number]  Chief Complaint  Patient presents with  . Leg Pain   HPI Patient is in today for follow-up. He continues to work long hours on his feet.e has noted increased sharp pain in his left posterior recently. Also complaining of pain in b/l calves withwalking. No recent injury or falls. No acute illness. Denies CP/palp/SOB/HA/congestion/fevers/GI or GU c/o. Taking meds as prescribed  Past Medical History  Diagnosis Date  . Chicken pox as a child  . Osteoarthritis of left knee     and right knee  . Tobacco abuse   . Hyperlipidemia   . Neck pain on left side 08/23/2012  . Anxiety 08/23/2012  . Hyperlipidemia 08/23/2012  . Fatigue   . Anxiety as acute reaction to exceptional stress 09/20/2012  . Back pain 07/29/2013  . Hypogonadism in male 10/02/2014  . Intermittent claudication 08/13/2015    B/l legs    Past Surgical History  Procedure Laterality Date  . Knee surgery      right knee 2012, left knee 2011 (arthroscopy)  . Wisdom tooth extraction    . Neck surgery  12/08/12    Family History  Problem Relation Age of Onset  . Fibromyalgia Mother   . Coronary artery disease Mother   . Hypertension Mother   . Heart attack Mother   . Heart disease Mother     CAD  . Peripheral vascular disease Mother 16    hemorrhage from vascular procedure  . Fibromyalgia Sister   . Hypertension Sister   . Heart disease Sister   . Other Son     mildly mentally handicap  . Seizures Son   . Lupus Sister   . Emphysema Sister     smoker  . Kidney disease Daughter   . Alcohol abuse Father     Social History   Social History  . Marital Status: Married    Spouse Name: N/A  . Number of Children: N/A  . Years of Education: N/A   Occupational History  . Not on file.   Social History Main Topics  . Smoking status: Current Every Day Smoker -- 1.50 packs/day for 25 years    Types: Cigarettes  .  Smokeless tobacco: Never Used  . Alcohol Use: No  . Drug Use: No  . Sexual Activity:    Partners: Female   Other Topics Concern  . Not on file   Social History Narrative    Outpatient Prescriptions Prior to Visit  Medication Sig Dispense Refill  . ALPRAZolam (XANAX) 1 MG tablet Take 1 tablet (1 mg total) by mouth 2 (two) times daily as needed for sleep or anxiety. 60 tablet 2  . cyclobenzaprine (FLEXERIL) 10 MG tablet Take 1 tablet (10 mg total) by mouth 3 (three) times daily as needed for muscle spasms. 30 tablet 5  . pravastatin (PRAVACHOL) 20 MG tablet Take 1 tablet (20 mg total) by mouth daily. 30 tablet 4  . HYDROcodone-acetaminophen (NORCO) 10-325 MG per tablet Take 1 tablet by mouth every 8 (eight) hours as needed for moderate pain or severe pain. 60 tablet 0  . naproxen (NAPROSYN) 500 MG tablet Take 1 tablet (500 mg total) by mouth 2 (two) times daily with a meal. 60 tablet 5  . testosterone (ANDROGEL) 50 MG/5GM (1%) GEL 4 pumps daily 5 g 0  . fenofibrate (TRICOR) 145 MG tablet Take 1 tablet (145 mg  total) by mouth daily. (Patient not taking: Reported on 07/31/2015) 30 tablet 6  . cyclobenzaprine (FLEXERIL) 10 MG tablet TAKE 1 TABLET BY MOUTH 3 TIMES DAILY AS NEEDED FOR MUSCLE SPASMS. 30 tablet 0   No facility-administered medications prior to visit.    Allergies  Allergen Reactions  . Penicillins Swelling and Rash    Review of Systems  Constitutional: Negative for fever and malaise/fatigue.  HENT: Negative for congestion.   Eyes: Negative for discharge.  Respiratory: Positive for cough and shortness of breath.   Cardiovascular: Negative for chest pain, palpitations and leg swelling.  Gastrointestinal: Negative for nausea and abdominal pain.  Genitourinary: Negative for dysuria.  Musculoskeletal: Positive for myalgias, back pain and joint pain. Negative for falls.  Skin: Negative for rash.  Neurological: Negative for loss of consciousness and headaches.    Endo/Heme/Allergies: Negative for environmental allergies.  Psychiatric/Behavioral: Negative for depression. The patient is not nervous/anxious.        Objective:    Physical Exam  Constitutional: He is oriented to person, place, and time. He appears well-developed and well-nourished. No distress.  HENT:  Head: Normocephalic and atraumatic.  Nose: Nose normal.  Eyes: Right eye exhibits no discharge. Left eye exhibits no discharge.  Neck: Normal range of motion. Neck supple.  Cardiovascular: Normal rate and regular rhythm.   No murmur heard. Pulmonary/Chest: Effort normal and breath sounds normal.  Abdominal: Soft. Bowel sounds are normal. There is no tenderness.  Musculoskeletal: He exhibits no edema.  Neurological: He is alert and oriented to person, place, and time.  Skin: Skin is warm and dry.  Psychiatric: He has a normal mood and affect.  Nursing note and vitals reviewed.   BP 130/80 mmHg  Pulse 69  Temp(Src) 98.5 F (36.9 C) (Oral)  Ht 5\' 10"  (1.778 m)  Wt 172 lb 2 oz (78.075 kg)  BMI 24.70 kg/m2  SpO2 97% Wt Readings from Last 3 Encounters:  07/31/15 172 lb 2 oz (78.075 kg)  04/03/15 179 lb 2 oz (81.251 kg)  09/26/14 187 lb (84.823 kg)     Lab Results  Component Value Date   WBC 9.4 04/03/2015   HGB 15.2 04/03/2015   HCT 43.5 04/03/2015   PLT 207.0 04/03/2015   GLUCOSE 73 04/03/2015   CHOL 137 05/30/2015   TRIG 167.0* 05/30/2015   HDL 31.50* 05/30/2015   LDLDIRECT 109.0 04/03/2015   LDLCALC 72 05/30/2015   ALT 28 05/30/2015   AST 22 05/30/2015   NA 136 04/03/2015   K 3.6 04/03/2015   CL 102 04/03/2015   CREATININE 0.89 04/03/2015   BUN 13 04/03/2015   CO2 26 04/03/2015   TSH 1.27 04/03/2015    Lab Results  Component Value Date   TSH 1.27 04/03/2015   Lab Results  Component Value Date   WBC 9.4 04/03/2015   HGB 15.2 04/03/2015   HCT 43.5 04/03/2015   MCV 87.2 04/03/2015   PLT 207.0 04/03/2015   Lab Results  Component Value Date   NA  136 04/03/2015   K 3.6 04/03/2015   CO2 26 04/03/2015   GLUCOSE 73 04/03/2015   BUN 13 04/03/2015   CREATININE 0.89 04/03/2015   BILITOT 0.5 05/30/2015   ALKPHOS 70 05/30/2015   AST 22 05/30/2015   ALT 28 05/30/2015   PROT 7.0 05/30/2015   ALBUMIN 4.3 05/30/2015   CALCIUM 9.8 04/03/2015   GFR 97.84 04/03/2015   Lab Results  Component Value Date   CHOL 137 05/30/2015  Lab Results  Component Value Date   HDL 31.50* 05/30/2015   Lab Results  Component Value Date   LDLCALC 72 05/30/2015   Lab Results  Component Value Date   TRIG 167.0* 05/30/2015   Lab Results  Component Value Date   CHOLHDL 4 05/30/2015   No results found for: HGBA1C     Assessment & Plan:   Problem List Items Addressed This Visit    Tobacco abuse    Is ready to try again, is given rx for nicotine patches. Encouraged complete cessation. Discussed need to quit as relates to risk of numerous cancers, cardiac and pulmonary disease as well as neurologic complications. Counseled for greater than 3 minutes      Relevant Medications   testosterone (ANDROGEL) 50 MG/5GM (1%) GEL   HYDROcodone-acetaminophen (NORCO) 10-325 MG per tablet   nicotine (NICODERM CQ - DOSED IN MG/24 HOURS) 21 mg/24hr patch   nicotine (NICODERM CQ - DOSED IN MG/24 HOURS) 14 mg/24hr patch   nicotine (NICODERM CQ - DOSED IN MG/24 HR) 7 mg/24hr patch   dipyridamole-aspirin (AGGRENOX) 200-25 MG per 12 hr capsule   Other Relevant Orders   Ambulatory referral to Vascular Surgery   Osteoarthritis of left knee    Increased pain left leg recently. Describes a sharp pain from posterior hip down back of thigh to knee. possibly sacroilietis. Continue current meds, add moist heat and topical treatments.      Relevant Medications   HYDROcodone-acetaminophen (NORCO) 10-325 MG per tablet   Intermittent claudication    Encouraged smoking cessation, started on Aggrenox and referred to vascular surgery for further intervention       Hypogonadism in male    Androgel 4 pumps daily      Hyperlipidemia    Tolerating statin, encouraged heart healthy diet, avoid trans fats, minimize simple carbs and saturated fats. Increase exercise as tolerated       Other Visit Diagnoses    Atherosclerosis of native arteries of extremity with intermittent claudication    -  Primary    Relevant Medications    testosterone (ANDROGEL) 50 MG/5GM (1%) GEL    HYDROcodone-acetaminophen (NORCO) 10-325 MG per tablet    nicotine (NICODERM CQ - DOSED IN MG/24 HOURS) 21 mg/24hr patch    nicotine (NICODERM CQ - DOSED IN MG/24 HOURS) 14 mg/24hr patch    nicotine (NICODERM CQ - DOSED IN MG/24 HR) 7 mg/24hr patch    dipyridamole-aspirin (AGGRENOX) 200-25 MG per 12 hr capsule    Other Relevant Orders    Ambulatory referral to Vascular Surgery    Hyperlipidemia, mixed        Relevant Medications    testosterone (ANDROGEL) 50 MG/5GM (1%) GEL    HYDROcodone-acetaminophen (NORCO) 10-325 MG per tablet    nicotine (NICODERM CQ - DOSED IN MG/24 HOURS) 21 mg/24hr patch    nicotine (NICODERM CQ - DOSED IN MG/24 HOURS) 14 mg/24hr patch    nicotine (NICODERM CQ - DOSED IN MG/24 HR) 7 mg/24hr patch    dipyridamole-aspirin (AGGRENOX) 200-25 MG per 12 hr capsule    Other Relevant Orders    Ambulatory referral to Vascular Surgery       I have discontinued Mr. Nemes's naproxen. I am also having him start on nicotine, nicotine, nicotine, and dipyridamole-aspirin. Additionally, I am having him maintain his cyclobenzaprine, ALPRAZolam, fenofibrate, pravastatin, testosterone, and HYDROcodone-acetaminophen.  Meds ordered this encounter  Medications  . testosterone (ANDROGEL) 50 MG/5GM (1%) GEL    Sig: 4 pumps daily  Dispense:  5 g    Refill:  0  . HYDROcodone-acetaminophen (NORCO) 10-325 MG per tablet    Sig: Take 1 tablet by mouth every 8 (eight) hours as needed for moderate pain or severe pain.    Dispense:  60 tablet    Refill:  0  . nicotine  (NICODERM CQ - DOSED IN MG/24 HOURS) 21 mg/24hr patch    Sig: Place 1 patch (21 mg total) onto the skin daily.    Dispense:  28 patch    Refill:  1  . nicotine (NICODERM CQ - DOSED IN MG/24 HOURS) 14 mg/24hr patch    Sig: Place 1 patch (14 mg total) onto the skin daily.    Dispense:  28 patch    Refill:  1  . nicotine (NICODERM CQ - DOSED IN MG/24 HR) 7 mg/24hr patch    Sig: Place 1 patch (7 mg total) onto the skin daily.    Dispense:  28 patch    Refill:  1  . dipyridamole-aspirin (AGGRENOX) 200-25 MG per 12 hr capsule    Sig: Take 1 capsule by mouth 2 (two) times daily.    Dispense:  60 capsule    Refill:  2     Penni Homans, MD

## 2015-08-13 NOTE — Assessment & Plan Note (Signed)
Tolerating statin, encouraged heart healthy diet, avoid trans fats, minimize simple carbs and saturated fats. Increase exercise as tolerated 

## 2015-08-13 NOTE — Assessment & Plan Note (Signed)
Increased pain left leg recently. Describes a sharp pain from posterior hip down back of thigh to knee. possibly sacroilietis. Continue current meds, add moist heat and topical treatments.

## 2015-08-13 NOTE — Assessment & Plan Note (Signed)
Androgel 4 pumps daily

## 2015-08-30 ENCOUNTER — Telehealth: Payer: Self-pay | Admitting: Family Medicine

## 2015-08-30 DIAGNOSIS — E782 Mixed hyperlipidemia: Secondary | ICD-10-CM

## 2015-08-30 DIAGNOSIS — I70219 Atherosclerosis of native arteries of extremities with intermittent claudication, unspecified extremity: Secondary | ICD-10-CM

## 2015-08-30 DIAGNOSIS — Z720 Tobacco use: Secondary | ICD-10-CM

## 2015-08-30 NOTE — Telephone Encounter (Signed)
So I just want to verify that both of his requested meds have been filled. If not please fill

## 2015-08-30 NOTE — Telephone Encounter (Addendum)
Testosterone (ANDROGEL) 50 MG/5GM (1%) GEL (patient states Rx needs PA)  Per phone note stamped 08/01/15-  Chaya Jan, CMA at 08/02/2015 8:42 AM     Status: Signed       Expand All Collapse All   Approved through 07/31/2016. Approval letter sent for scanning. JG//CMA        Called pharmacy and verified that PA was not pending.  Pam, pharm tech, stated no PA required.   Please advise regarding refill.     HYDROcodone-acetaminophen (NORCO) 10-325 MG per tablet  Last filled: 07/31/15 Amt: 60, 0  Last OV: 07/31/15  Please advise.

## 2015-08-30 NOTE — Telephone Encounter (Signed)
Relation to IX:BOER Call back number: [phone]  Pharmacy:  Reason for call:  Patient requesting a refill HYDROcodone-acetaminophen (NORCO) 10-325 MG per tablet and testosterone (ANDROGEL) 50 MG/5GM (1%) GEL (patient states Rx needs PA)

## 2015-08-31 MED ORDER — TESTOSTERONE 50 MG/5GM (1%) TD GEL: TRANSDERMAL | Status: DC

## 2015-08-31 MED ORDER — HYDROCODONE-ACETAMINOPHEN 10-325 MG PO TABS: 1.0000 | ORAL_TABLET | Freq: Three times a day (TID) | ORAL | Status: DC | PRN

## 2015-08-31 NOTE — Telephone Encounter (Signed)
Neither had been filled.  Printed and on counter for signature

## 2015-08-31 NOTE — Telephone Encounter (Signed)
Patient informed to pickup hardcopy of both prescriptions at the front desk.

## 2015-09-05 ENCOUNTER — Ambulatory Visit (INDEPENDENT_AMBULATORY_CARE_PROVIDER_SITE_OTHER): Payer: Commercial Managed Care - PPO | Admitting: Family Medicine

## 2015-09-05 ENCOUNTER — Encounter: Payer: Self-pay | Admitting: Family Medicine

## 2015-09-05 ENCOUNTER — Telehealth: Payer: Self-pay | Admitting: Family Medicine

## 2015-09-05 VITALS — BP 130/78 | HR 67 | Temp 97.8°F | Ht 70.0 in | Wt 174.5 lb

## 2015-09-05 DIAGNOSIS — R7989 Other specified abnormal findings of blood chemistry: Secondary | ICD-10-CM

## 2015-09-05 DIAGNOSIS — M542 Cervicalgia: Secondary | ICD-10-CM

## 2015-09-05 DIAGNOSIS — R030 Elevated blood-pressure reading, without diagnosis of hypertension: Secondary | ICD-10-CM | POA: Diagnosis not present

## 2015-09-05 DIAGNOSIS — Z720 Tobacco use: Secondary | ICD-10-CM

## 2015-09-05 DIAGNOSIS — I739 Peripheral vascular disease, unspecified: Secondary | ICD-10-CM

## 2015-09-05 DIAGNOSIS — E782 Mixed hyperlipidemia: Secondary | ICD-10-CM

## 2015-09-05 DIAGNOSIS — E785 Hyperlipidemia, unspecified: Secondary | ICD-10-CM

## 2015-09-05 DIAGNOSIS — E291 Testicular hypofunction: Secondary | ICD-10-CM

## 2015-09-05 DIAGNOSIS — IMO0001 Reserved for inherently not codable concepts without codable children: Secondary | ICD-10-CM

## 2015-09-05 LAB — COMPREHENSIVE METABOLIC PANEL
ALBUMIN: 4.4 g/dL (ref 3.5–5.2)
ALK PHOS: 71 U/L (ref 39–117)
ALT: 21 U/L (ref 0–53)
AST: 20 U/L (ref 0–37)
BUN: 12 mg/dL (ref 6–23)
CALCIUM: 9.5 mg/dL (ref 8.4–10.5)
CHLORIDE: 101 meq/L (ref 96–112)
CO2: 31 mEq/L (ref 19–32)
Creatinine, Ser: 0.87 mg/dL (ref 0.40–1.50)
GFR: 100.26 mL/min (ref >60.00)
Glucose, Bld: 83 mg/dL (ref 70–99)
POTASSIUM: 3.4 meq/L — AB (ref 3.5–5.1)
SODIUM: 139 meq/L (ref 135–145)
TOTAL PROTEIN: 6.9 g/dL (ref 6.0–8.3)
Total Bilirubin: 0.3 mg/dL (ref 0.2–1.2)

## 2015-09-05 LAB — LIPID PANEL
CHOL/HDL RATIO: 5
CHOLESTEROL: 174 mg/dL (ref 0–200)
HDL: 36.4 mg/dL — AB (ref >39.00)
NonHDL: 137.66
TRIGLYCERIDES: 322 mg/dL — AB (ref 0.0–149.0)
VLDL: 64.4 mg/dL — AB (ref 0.0–40.0)

## 2015-09-05 LAB — CBC
HEMATOCRIT: 40.2 % (ref 39.0–52.0)
HEMOGLOBIN: 13.8 g/dL (ref 13.0–17.0)
MCHC: 34.4 g/dL (ref 30.0–36.0)
MCV: 88.5 fl (ref 78.0–100.0)
PLATELETS: 215 10*3/uL (ref 150.0–400.0)
RBC: 4.54 Mil/uL (ref 4.22–5.81)
RDW: 12.6 % (ref 11.5–15.5)
WBC: 7.8 10*3/uL (ref 4.0–10.5)

## 2015-09-05 LAB — TESTOSTERONE: Testosterone: 173.42 ng/dL — ABNORMAL LOW (ref 300.00–890.00)

## 2015-09-05 LAB — LDL CHOLESTEROL, DIRECT: LDL DIRECT: 113 mg/dL

## 2015-09-05 LAB — TSH: TSH: 1.25 u[IU]/mL (ref 0.35–4.50)

## 2015-09-05 MED ORDER — TESTOSTERONE 50 MG/5GM (1%) TD GEL: TRANSDERMAL | Status: DC

## 2015-09-05 NOTE — Telephone Encounter (Signed)
Caller name: Meridith from [facility] Call back number: [phone]    Reason for call:  Pharmacy in need of clarification of quantity regarding testosterone (ANDROGEL) 50 MG/5GM (1%) GEL.

## 2015-09-05 NOTE — Telephone Encounter (Signed)
Called the pharmacy to clarify they need to know the day supply of the testosterone and they can fill correctly.

## 2015-09-05 NOTE — Telephone Encounter (Signed)
Called the pharmacy after speaking to Dr. Charlett Blake informed them to prescribe enough for a 30 day supply.

## 2015-09-05 NOTE — Progress Notes (Signed)
Patient ID: Elijah Marquez, male   DOB: 1971/10/20, 44 y.o.   MRN: [id number]   Subjective:    Patient ID: Elijah Marquez, male    DOB: 1971-08-16, 44 y.o.   MRN: [id number]  Chief Complaint  Patient presents with  . Follow-up    HPI Patient is in today for follow-up. She notes that he has not had a cigarette and 35 days. He did not start Aggrenox but he did start aspirin and evaluation by vascular surgery has revealed that his leg pain was not caused by peripheral vascular disease. Pain is improving. No other recent illness. He does continue to have back pain and neck pain which is ongoing since his injury at several years ago. Denies CP/palp/SOB/HA/congestion/fevers/GI or GU c/o. Taking meds as prescribed  Past Medical History  Diagnosis Date  . Chicken pox as a child  . Osteoarthritis of left knee     and right knee  . Tobacco abuse   . Hyperlipidemia   . Neck pain on left side 08/23/2012  . Anxiety 08/23/2012  . Hyperlipidemia 08/23/2012  . Fatigue   . Anxiety as acute reaction to exceptional stress 09/20/2012  . Back pain 07/29/2013  . Hypogonadism in male 10/02/2014  . Intermittent claudication ([place]) 08/13/2015    B/l legs    Past Surgical History  Procedure Laterality Date  . Knee surgery      right knee 2012, left knee 2011 (arthroscopy)  . Wisdom tooth extraction    . Neck surgery  12/08/12    Family History  Problem Relation Age of Onset  . Fibromyalgia Mother   . Coronary artery disease Mother   . Hypertension Mother   . Heart attack Mother   . Heart disease Mother     CAD  . Peripheral vascular disease Mother 70    hemorrhage from vascular procedure  . Fibromyalgia Sister   . Hypertension Sister   . Heart disease Sister   . Other Son     mildly mentally handicap  . Seizures Son   . Lupus Sister   . Emphysema Sister     smoker  . Kidney disease Daughter   . Alcohol abuse Father     Social History   Social History  . Marital Status: Married    Spouse  Name: N/A  . Number of Children: N/A  . Years of Education: N/A   Occupational History  . Not on file.   Social History Main Topics  . Smoking status: Current Every Day Smoker -- 1.50 packs/day for 25 years    Types: Cigarettes  . Smokeless tobacco: Never Used  . Alcohol Use: No  . Drug Use: No  . Sexual Activity:    Partners: Female   Other Topics Concern  . Not on file   Social History Narrative    Outpatient Prescriptions Prior to Visit  Medication Sig Dispense Refill  . ALPRAZolam (XANAX) 1 MG tablet Take 1 tablet (1 mg total) by mouth 2 (two) times daily as needed for sleep or anxiety. 60 tablet 2  . cyclobenzaprine (FLEXERIL) 10 MG tablet Take 1 tablet (10 mg total) by mouth 3 (three) times daily as needed for muscle spasms. 30 tablet 5  . HYDROcodone-acetaminophen (NORCO) 10-325 MG tablet Take 1 tablet by mouth every 8 (eight) hours as needed for moderate pain or severe pain. 60 tablet 0  . nicotine (NICODERM CQ - DOSED IN MG/24 HOURS) 14 mg/24hr patch Place 1 patch (14 mg total) onto the skin  daily. 28 patch 1  . nicotine (NICODERM CQ - DOSED IN MG/24 HR) 7 mg/24hr patch Place 1 patch (7 mg total) onto the skin daily. 28 patch 1  . pravastatin (PRAVACHOL) 20 MG tablet Take 1 tablet (20 mg total) by mouth daily. 30 tablet 4  . testosterone (ANDROGEL) 50 MG/5GM (1%) GEL 4 pumps daily 5 g 0  . fenofibrate (TRICOR) 145 MG tablet Take 1 tablet (145 mg total) by mouth daily. 30 tablet 6  . nicotine (NICODERM CQ - DOSED IN MG/24 HOURS) 21 mg/24hr patch Place 1 patch (21 mg total) onto the skin daily. 28 patch 1  . dipyridamole-aspirin (AGGRENOX) 200-25 MG per 12 hr capsule Take 1 capsule by mouth 2 (two) times daily. (Patient not taking: Reported on 09/05/2015) 60 capsule 2   No facility-administered medications prior to visit.    Allergies  Allergen Reactions  . Penicillins Swelling and Rash    Review of Systems  Constitutional: Negative for fever and malaise/fatigue.    HENT: Negative for congestion.   Eyes: Negative for discharge.  Respiratory: Negative for shortness of breath.   Cardiovascular: Negative for chest pain, palpitations and leg swelling.  Gastrointestinal: Negative for nausea and abdominal pain.  Genitourinary: Negative for dysuria.  Musculoskeletal: Positive for myalgias, back pain, joint pain and neck pain. Negative for falls.  Skin: Negative for rash.  Neurological: Negative for loss of consciousness and headaches.  Endo/Heme/Allergies: Negative for environmental allergies.  Psychiatric/Behavioral: Negative for depression. The patient is not nervous/anxious.        Objective:    Physical Exam  Constitutional: He is oriented to person, place, and time. He appears well-developed and well-nourished. No distress.  HENT:  Head: Normocephalic and atraumatic.  Nose: Nose normal.  Eyes: Right eye exhibits no discharge. Left eye exhibits no discharge.  Neck: Normal range of motion. Neck supple.  Cardiovascular: Normal rate and regular rhythm.   No murmur heard. Pulmonary/Chest: Effort normal and breath sounds normal.  Abdominal: Soft. Bowel sounds are normal. There is no tenderness.  Musculoskeletal: He exhibits no edema.  Neurological: He is alert and oriented to person, place, and time.  Skin: Skin is warm and dry.  Psychiatric: He has a normal mood and affect.  Nursing note and vitals reviewed.   BP 140/78 mmHg  Pulse 67  Temp(Src) 97.8 F (36.6 C) (Oral)  Ht 5\' 10"  (1.778 m)  Wt 174 lb 8 oz (79.153 kg)  BMI 25.04 kg/m2  SpO2 98% Wt Readings from Last 3 Encounters:  09/05/15 174 lb 8 oz (79.153 kg)  07/31/15 172 lb 2 oz (78.075 kg)  04/03/15 179 lb 2 oz (81.251 kg)     Lab Results  Component Value Date   WBC 9.4 04/03/2015   HGB 15.2 04/03/2015   HCT 43.5 04/03/2015   PLT 207.0 04/03/2015   GLUCOSE 73 04/03/2015   CHOL 137 05/30/2015   TRIG 167.0* 05/30/2015   HDL 31.50* 05/30/2015   LDLDIRECT 109.0 04/03/2015    LDLCALC 72 05/30/2015   ALT 28 05/30/2015   AST 22 05/30/2015   NA 136 04/03/2015   K 3.6 04/03/2015   CL 102 04/03/2015   CREATININE 0.89 04/03/2015   BUN 13 04/03/2015   CO2 26 04/03/2015   TSH 1.27 04/03/2015    Lab Results  Component Value Date   TSH 1.27 04/03/2015   Lab Results  Component Value Date   WBC 9.4 04/03/2015   HGB 15.2 04/03/2015   HCT 43.5 04/03/2015  MCV 87.2 04/03/2015   PLT 207.0 04/03/2015   Lab Results  Component Value Date   NA 136 04/03/2015   K 3.6 04/03/2015   CO2 26 04/03/2015   GLUCOSE 73 04/03/2015   BUN 13 04/03/2015   CREATININE 0.89 04/03/2015   BILITOT 0.5 05/30/2015   ALKPHOS 70 05/30/2015   AST 22 05/30/2015   ALT 28 05/30/2015   PROT 7.0 05/30/2015   ALBUMIN 4.3 05/30/2015   CALCIUM 9.8 04/03/2015   GFR 97.84 04/03/2015   Lab Results  Component Value Date   CHOL 137 05/30/2015   Lab Results  Component Value Date   HDL 31.50* 05/30/2015   Lab Results  Component Value Date   LDLCALC 72 05/30/2015   Lab Results  Component Value Date   TRIG 167.0* 05/30/2015   Lab Results  Component Value Date   CHOLHDL 4 05/30/2015   No results found for: HGBA1C     Assessment & Plan:   Tobacco abuse Has not smoked a cigarette in 35 days. Encouraged ongoing efforts.   Elevated BP Well controlled, no changes to meds. Encouraged heart healthy diet such as the DASH diet and exercise as tolerated.   Hyperlipidemia Tolerating statin, encouraged heart healthy diet, avoid trans fats, minimize simple carbs and saturated fats. Increase exercise as tolerated  Intermittent claudication Was seen by vascular surgery and he was evaluated and the decision was made that his pain in his legs was not caused by PVD. He does acknowledge his pain has improved since he stopped smoking  Neck pain on left side Continues to struggle with chronic pain but manages with minimal meds   I have discontinued Mr. Berhane's fenofibrate and  dipyridamole-aspirin. I am also having him maintain his cyclobenzaprine, ALPRAZolam, pravastatin, nicotine, nicotine, testosterone, and HYDROcodone-acetaminophen.  No orders of the defined types were placed in this encounter.     Elizabeth Sauer, LPN

## 2015-09-05 NOTE — Patient Instructions (Signed)

## 2015-09-06 ENCOUNTER — Telehealth: Payer: Self-pay

## 2015-09-06 MED ORDER — ATORVASTATIN CALCIUM 20 MG PO TABS: 20.0000 mg | ORAL_TABLET | Freq: Every day | ORAL | Status: DC

## 2015-09-06 NOTE — Telephone Encounter (Signed)
-----   Message from [name], MD sent at 09/05/2015 10:41 PM EDT ----- Potassium slightly low, increase in diet. Cholesterol up, recommend we switch from Pravastatin to the stronger Atorvastatin 20 mg tabs, 1 tab po qhs disp #30 with 5 rf. Testosterone better but still low. Increase androgel to 6 pumps as we discussed

## 2015-09-06 NOTE — Telephone Encounter (Signed)
Pt notified or results, agreed to medication switch. Atorvastatin sent to pharmacy and pravastatin discontinued. No questions at this time.

## 2015-09-10 NOTE — Assessment & Plan Note (Signed)
Was seen by vascular surgery and he was evaluated and the decision was made that his pain in his legs was not caused by PVD. He does acknowledge his pain has improved since he stopped smoking

## 2015-09-10 NOTE — Assessment & Plan Note (Signed)
Tolerating statin, encouraged heart healthy diet, avoid trans fats, minimize simple carbs and saturated fats. Increase exercise as tolerated 

## 2015-09-10 NOTE — Assessment & Plan Note (Signed)
Well controlled, no changes to meds. Encouraged heart healthy diet such as the DASH diet and exercise as tolerated.  °

## 2015-09-10 NOTE — Assessment & Plan Note (Signed)
Continues to struggle with chronic pain but manages with minimal meds

## 2015-09-10 NOTE — Assessment & Plan Note (Addendum)
Has not smoked a cigarette in 35 days. Encouraged ongoing efforts.

## 2015-09-28 ENCOUNTER — Telehealth: Payer: Self-pay | Admitting: Family Medicine

## 2015-09-28 DIAGNOSIS — E782 Mixed hyperlipidemia: Secondary | ICD-10-CM

## 2015-09-28 DIAGNOSIS — Z720 Tobacco use: Secondary | ICD-10-CM

## 2015-09-28 NOTE — Telephone Encounter (Signed)
Relation to BO:MQTT Call back [facility]:   Reason for call:  Patient requesting a refill HYDROcodone-acetaminophen (NORCO) 10-325 MG tablet

## 2015-09-28 NOTE — Telephone Encounter (Signed)
OK to refill hydrocodone, due for UDS?

## 2015-09-28 NOTE — Telephone Encounter (Signed)
Requesting:Hydrocodone Contract 06/29/15 UDS Low Risk Last OV  09/05/15 Last Refill  #60 with 0 refills 08/31/15  Please Advise

## 2015-09-29 MED ORDER — HYDROCODONE-ACETAMINOPHEN 10-325 MG PO TABS: 1.0000 | ORAL_TABLET | Freq: Three times a day (TID) | ORAL | Status: DC | PRN

## 2015-09-29 NOTE — Telephone Encounter (Signed)
Printed hydrocodone and informed patient to pickup/do UDS at the front desk. UDS is due

## 2015-10-09 ENCOUNTER — Ambulatory Visit: Payer: Commercial Managed Care - PPO | Admitting: Family Medicine

## 2015-10-24 ENCOUNTER — Telehealth: Payer: Self-pay | Admitting: Family Medicine

## 2015-10-24 DIAGNOSIS — Z720 Tobacco use: Secondary | ICD-10-CM

## 2015-10-24 DIAGNOSIS — E782 Mixed hyperlipidemia: Secondary | ICD-10-CM

## 2015-10-24 MED ORDER — TESTOSTERONE 50 MG/5GM (1%) TD GEL: TRANSDERMAL | Status: DC

## 2015-10-24 NOTE — Telephone Encounter (Signed)
Called the patient left message to call back 

## 2015-10-24 NOTE — Telephone Encounter (Signed)
Called [facility]/patient to clarify. Phoned prescription refill  in as [facility] instructed per insurance/PCP instructions.

## 2015-10-24 NOTE — Telephone Encounter (Signed)
He can have refill on hsi Testosterone but please double check is he getting the Androgel 1% or 1.62% then can refill with same sig, with 2rf

## 2015-10-24 NOTE — Telephone Encounter (Signed)
Relation to [id number] Call back [place]: [place] [place] [facility] [place] [phone] (Phone) [phone] (Fax)         Reason for call:  Patient requesting a refill testosterone (ANDROGEL) 50 MG/5GM (1%) GEL

## 2015-10-30 ENCOUNTER — Other Ambulatory Visit: Payer: Self-pay | Admitting: Family Medicine

## 2015-10-30 DIAGNOSIS — Z720 Tobacco use: Secondary | ICD-10-CM

## 2015-10-30 DIAGNOSIS — E782 Mixed hyperlipidemia: Secondary | ICD-10-CM

## 2015-10-30 MED ORDER — HYDROCODONE-ACETAMINOPHEN 10-325 MG PO TABS: 1.0000 | ORAL_TABLET | Freq: Three times a day (TID) | ORAL | Status: DC | PRN

## 2015-10-30 NOTE — Telephone Encounter (Signed)
Requesting: Hydrocodone and Naproxen Contract  06/29/2015 UDS  Low Risk Last OV  09/05/2015  Last Refill  09/29/2015  #60  Please Advise

## 2015-10-30 NOTE — Telephone Encounter (Signed)
Called the patient informed to pickup hardcopy at the desk

## 2015-10-30 NOTE — Telephone Encounter (Signed)
Pt needing refill on hydrocodone. He is out. Please call when RX is ready for pick up @ [phone]. Pt needing refill on naproxen 500mg  taking 1ud 2xday. Please send to [place].

## 2015-11-28 ENCOUNTER — Other Ambulatory Visit: Payer: Self-pay

## 2015-11-28 DIAGNOSIS — Z720 Tobacco use: Secondary | ICD-10-CM

## 2015-11-28 DIAGNOSIS — E782 Mixed hyperlipidemia: Secondary | ICD-10-CM

## 2015-11-28 MED ORDER — HYDROCODONE-ACETAMINOPHEN 10-325 MG PO TABS: 1.0000 | ORAL_TABLET | Freq: Three times a day (TID) | ORAL | Status: DC | PRN

## 2015-11-28 MED ORDER — TESTOSTERONE 50 MG/5GM (1%) TD GEL: TRANSDERMAL | Status: DC

## 2015-11-28 NOTE — Telephone Encounter (Signed)
Called the patient informed to pickup hardcopy of hydrocodone at the front desk. 

## 2015-11-28 NOTE — Telephone Encounter (Signed)
Patient called needing a refill on his medications please notify when they are complete. Pharmacy: [facility]  HYDROcodone-acetaminophen [facility]) 10-325 MG tablet [id number]       Medication Name:testosterone (ANDROGEL) 50 MG/5GM (1%) GEL [id number]

## 2015-11-28 NOTE — Telephone Encounter (Signed)
Requesting--:  Hydrocodone and testosterone Gel Contract--  06/29/2015 UDS-- Done on 10/02/2015 Low Risk next due 4/31/2017 Last OV--   09/05/2015 Last Refill--   Hydrocodone  #60 with 0 refills on 10/30/2015                 --   Testosterone 2 pk. With 0 refills on 10/24/2015   Please Advise

## 2015-12-07 ENCOUNTER — Other Ambulatory Visit: Payer: Self-pay | Admitting: Family Medicine

## 2015-12-07 DIAGNOSIS — E782 Mixed hyperlipidemia: Secondary | ICD-10-CM

## 2015-12-07 DIAGNOSIS — Z720 Tobacco use: Secondary | ICD-10-CM

## 2015-12-07 MED ORDER — TESTOSTERONE 50 MG/5GM (1%) TD GEL: TRANSDERMAL | Status: DC

## 2015-12-07 NOTE — Telephone Encounter (Signed)
Caller name: [name]  Relationship to patient: Self   Can be reached: [phone]  Pharmacy: [place], [place]  Reason for call: Pt is requesting a refill on his testosterone medication. Pt says that it is needed Rx for pick up today.

## 2015-12-19 MED ORDER — TESTOSTERONE 50 MG/5GM (1%) TD GEL: TRANSDERMAL | Status: DC

## 2015-12-19 MED FILL — TESTOSTERONE 12.5 MG/1.25 G: 12.5 MG/ACT | 30 days supply | Qty: 300 | Fill #0

## 2015-12-19 NOTE — Telephone Encounter (Signed)
Prescription had been previously set on phone in had to change to fax/PCP signed and faxed to [facility].

## 2015-12-19 NOTE — Addendum Note (Signed)
Addended by: Sharon Seller B on: 12/19/2015 03:18 PM   Modules accepted: Orders

## 2015-12-19 NOTE — Telephone Encounter (Signed)
Prescription had been approved on 12/07/2015, but not sent in.  Pharmacy needs printed and faxed downstairs.

## 2015-12-28 ENCOUNTER — Telehealth: Payer: Self-pay | Admitting: Family Medicine

## 2015-12-28 DIAGNOSIS — Z720 Tobacco use: Secondary | ICD-10-CM

## 2015-12-28 DIAGNOSIS — E782 Mixed hyperlipidemia: Secondary | ICD-10-CM

## 2015-12-28 NOTE — Telephone Encounter (Signed)
Refill x1 

## 2015-12-28 NOTE — Telephone Encounter (Signed)
Last filled: 11/28/15 Amt: 60, 0 Last OV:  09/05/2015 Contract on file UDS: LOW risk  Please advise.

## 2015-12-28 NOTE — Telephone Encounter (Signed)
Relation to [id number] Call back [phone]   Reason for call:  Patient requesting a refill HYDROcodone-acetaminophen (NORCO) 10-325 MG tablet. Patient will run out by Saturday

## 2015-12-29 MED ORDER — HYDROCODONE-ACETAMINOPHEN 10-325 MG PO TABS: 1.0000 | ORAL_TABLET | Freq: Three times a day (TID) | ORAL | Status: DC | PRN

## 2015-12-29 MED FILL — HYDROCODON-APAP 10-325: 10-325 | 20 days supply | Qty: 60 | Fill #0

## 2015-12-29 NOTE — Telephone Encounter (Signed)
Printed and on counter for signature by Dr. Etter Sjogren. Called the patient informed to pickup hardcopy at the front desk.

## 2016-01-08 ENCOUNTER — Ambulatory Visit: Payer: Commercial Managed Care - PPO | Admitting: Family Medicine

## 2016-01-23 ENCOUNTER — Encounter: Payer: Self-pay | Admitting: Family Medicine

## 2016-01-23 ENCOUNTER — Ambulatory Visit (INDEPENDENT_AMBULATORY_CARE_PROVIDER_SITE_OTHER): Payer: Commercial Managed Care - PPO | Admitting: Family Medicine

## 2016-01-23 VITALS — BP 129/109 | HR 73 | Temp 98.1°F | Ht 70.0 in | Wt 172.4 lb

## 2016-01-23 DIAGNOSIS — E291 Testicular hypofunction: Secondary | ICD-10-CM | POA: Diagnosis not present

## 2016-01-23 DIAGNOSIS — M542 Cervicalgia: Secondary | ICD-10-CM

## 2016-01-23 DIAGNOSIS — R7989 Other specified abnormal findings of blood chemistry: Secondary | ICD-10-CM

## 2016-01-23 DIAGNOSIS — Z720 Tobacco use: Secondary | ICD-10-CM

## 2016-01-23 DIAGNOSIS — R030 Elevated blood-pressure reading, without diagnosis of hypertension: Secondary | ICD-10-CM

## 2016-01-23 DIAGNOSIS — IMO0001 Reserved for inherently not codable concepts without codable children: Secondary | ICD-10-CM

## 2016-01-23 DIAGNOSIS — M170 Bilateral primary osteoarthritis of knee: Secondary | ICD-10-CM

## 2016-01-23 MED ORDER — GABAPENTIN 300 MG PO CAPS: 300.0000 mg | ORAL_CAPSULE | Freq: Every day | ORAL | Status: DC

## 2016-01-23 MED FILL — GABAPENTIN 300 MG CAPSULE: 300 | 30 days supply | Qty: 30 | Fill #0

## 2016-01-23 NOTE — Assessment & Plan Note (Signed)
Follows with [facility], Dr Reginold Agent, needs joint replacement, has daily pain

## 2016-01-23 NOTE — Patient Instructions (Signed)
  Arthritis Arthritis means joint pain. It can also mean joint disease. A joint is a place where bones come together. People who have arthritis may have:  Red joints.  Swollen joints.  Stiff joints.  Warm joints.  A fever.  A feeling of being sick. HOME CARE Pay attention to any changes in your symptoms. Take these actions to help with your pain and swelling. Medicines  Take over-the-counter and prescription medicines only as told by your doctor.  Do not take aspirin for pain if your doctor says that you may have gout. Activities  Rest your joint if your doctor tells you to.  Avoid activities that make the pain worse.  Exercise your joint regularly as told by your doctor. Try doing exercises like:  Swimming.  Water aerobics.  Biking.  Walking. Joint Care  If your joint is swollen, keep it raised (elevated) if told by your doctor.  If your joint feels stiff in the morning, try taking a warm shower.  If you have diabetes, do not apply heat without asking your doctor.  If told, apply heat to the joint:  Put a towel between the joint and the hot pack or heating pad.  Leave the heat on the area for 20-30 minutes.  If told, apply ice to the joint:  Put ice in a plastic bag.  Place a towel between your skin and the bag.  Leave the ice on for 20 minutes, 2-3 times per day.  Keep all follow-up visits as told by your doctor. GET HELP IF:  The pain gets worse.  You have a fever. GET HELP RIGHT AWAY IF:  You have very bad pain in your joint.  You have swelling in your joint.  Your joint is red.  Many joints become painful and swollen.  You have very bad back pain.  Your leg is very weak.  You cannot control your pee (urine) or poop (stool).   This information is not intended to replace advice given to you by your health care provider. Make sure you discuss any questions you have with your health care provider.   Document Released: 02/12/2010  Document Revised: 08/09/2015 Document Reviewed: 02/13/2015 Elsevier Interactive Patient Education 2016 [employer]  

## 2016-01-23 NOTE — Progress Notes (Signed)
Pre visit review using our clinic review tool, if applicable. No additional management support is needed unless otherwise documented below in the visit note. 

## 2016-01-25 ENCOUNTER — Other Ambulatory Visit: Payer: Commercial Managed Care - PPO

## 2016-01-29 ENCOUNTER — Other Ambulatory Visit: Payer: Self-pay | Admitting: Family Medicine

## 2016-01-29 DIAGNOSIS — Z720 Tobacco use: Secondary | ICD-10-CM

## 2016-01-29 DIAGNOSIS — E782 Mixed hyperlipidemia: Secondary | ICD-10-CM

## 2016-01-29 MED ORDER — HYDROCODONE-ACETAMINOPHEN 10-325 MG PO TABS
1.0000 | ORAL_TABLET | Freq: Three times a day (TID) | ORAL | Status: DC | PRN
Start: 2016-01-29 — End: 2016-02-22

## 2016-01-29 MED FILL — HYDROCODON-APAP 10-325: 10-325 | 20 days supply | Qty: 60 | Fill #0

## 2016-01-29 NOTE — Telephone Encounter (Signed)
°  Relation to [id number] Call back Jessie:  Reason for call:  Patient requesting a refill HYDROcodone-acetaminophen (NORCO) 10-325 MG tablet

## 2016-01-29 NOTE — Telephone Encounter (Signed)
Called the patient informed hardcopy for hydrocodone is ready for pickup at the front desk. 

## 2016-01-29 NOTE — Telephone Encounter (Signed)
Requesting: Hydrocodone Contract signed 03/31/2014 UDS  Low risk next due April 2017 Last OV 01/23/2016 Last Refill  #60  12/29/2015  Please Advise

## 2016-02-01 MED FILL — NAPROXEN 500 MG TABLET: 500 | 30 days supply | Qty: 60 | Fill #2

## 2016-02-04 ENCOUNTER — Encounter: Payer: Self-pay | Admitting: Family Medicine

## 2016-02-04 NOTE — Assessment & Plan Note (Addendum)
Has been abstaining for roughly 6 months.  Encouraged ongoing efforts.

## 2016-02-04 NOTE — Assessment & Plan Note (Signed)
Continues to struggle with daily pain. Manages by staying busy and current meds.

## 2016-02-04 NOTE — Assessment & Plan Note (Signed)
Improved on recheck. Encouraged heart healthy diet such as the DASH diet and exercise as tolerated.

## 2016-02-04 NOTE — Assessment & Plan Note (Signed)
Will needs labs with net blood work.

## 2016-02-04 NOTE — Progress Notes (Signed)
Patient ID: Kavion Mancinas, male   DOB: 06/02/1971, 45 y.o.   MRN: [id number]   Subjective:    Patient ID: Blair Lundeen, male    DOB: [birth year], 45 y.o.   MRN: [id number]  Chief Complaint  Patient presents with  . Follow-up    HPI Patient is in today for follow up. Continues to struggle with neck pain. He manages to stay busy but has intense daily pain. Is also noting knee pain is occuring, no recent injury, swelling or redness. No other acute illness has not smoked in over 6 months and feels well. Denies CP/palp/SOB/HA/congestion/fevers/GI or GU c/o. Taking meds as prescribed  Past Medical History  Diagnosis Date  . Chicken pox as a child  . Osteoarthritis of left knee     and right knee  . Tobacco abuse   . Hyperlipidemia   . Neck pain on left side 08/23/2012  . Anxiety 08/23/2012  . Hyperlipidemia 08/23/2012  . Fatigue   . Anxiety as acute reaction to exceptional stress 09/20/2012  . Back pain 07/29/2013  . Hypogonadism in male 10/02/2014  . Intermittent claudication (HCC) 08/13/2015    B/l legs  . Osteoarthritis of both knees     Past Surgical History  Procedure Laterality Date  . Knee surgery      right knee 2012, left knee 2011 (arthroscopy)  . Wisdom tooth extraction    . Neck surgery  12/08/12    Family History  Problem Relation Age of Onset  . Fibromyalgia Mother   . Coronary artery disease Mother   . Hypertension Mother   . Heart attack Mother   . Heart disease Mother     CAD  . Peripheral vascular disease Mother 71    hemorrhage from vascular procedure  . Fibromyalgia Sister   . Hypertension Sister   . Heart disease Sister   . Other Son     mildly mentally handicap  . Seizures Son   . Lupus Sister   . Emphysema Sister     smoker  . Kidney disease Daughter   . Alcohol abuse Father     Social History   Social History  . Marital Status: Married    Spouse Name: N/A  . Number of Children: N/A  . Years of Education: N/A   Occupational History  .  Not on file.   Social History Main Topics  . Smoking status: Former Smoker -- 1.50 packs/day for 25 years    Types: Cigarettes    Quit date: 07/31/2015  . Smokeless tobacco: Never Used  . Alcohol Use: No  . Drug Use: No  . Sexual Activity:    Partners: Female   Other Topics Concern  . Not on file   Social History Narrative    Outpatient Prescriptions Prior to Visit  Medication Sig Dispense Refill  . ALPRAZolam (XANAX) 1 MG tablet Take 1 tablet (1 mg total) by mouth 2 (two) times daily as needed for sleep or anxiety. 60 tablet 2  . atorvastatin (LIPITOR) 20 MG tablet Take 1 tablet (20 mg total) by mouth at bedtime. 30 tablet 5  . cyclobenzaprine (FLEXERIL) 10 MG tablet Take 1 tablet (10 mg total) by mouth 3 (three) times daily as needed for muscle spasms. 30 tablet 5  . testosterone (ANDROGEL) 50 MG/5GM (1%) GEL 8 pumps daily 2 Package 0  . HYDROcodone-acetaminophen (NORCO) 10-325 MG tablet Take 1 tablet by mouth every 8 (eight) hours as needed for moderate pain or severe pain. 60 tablet 0  .  nicotine (NICODERM CQ - DOSED IN MG/24 HOURS) 14 mg/24hr patch Place 1 patch (14 mg total) onto the skin daily. 28 patch 1  . nicotine (NICODERM CQ - DOSED IN MG/24 HR) 7 mg/24hr patch Place 1 patch (7 mg total) onto the skin daily. 28 patch 1   No facility-administered medications prior to visit.    Allergies  Allergen Reactions  . Penicillins Swelling and Rash    Review of Systems  Constitutional: Positive for malaise/fatigue. Negative for fever.  HENT: Negative for congestion.   Eyes: Negative for discharge.  Respiratory: Negative for shortness of breath.   Cardiovascular: Negative for chest pain, palpitations and leg swelling.  Gastrointestinal: Negative for nausea and abdominal pain.  Genitourinary: Negative for dysuria.  Musculoskeletal: Positive for myalgias and back pain. Negative for falls.  Skin: Negative for rash.  Neurological: Negative for loss of consciousness and  headaches.  Endo/Heme/Allergies: Negative for environmental allergies.  Psychiatric/Behavioral: Positive for depression. The patient is not nervous/anxious.        Objective:    Physical Exam  Constitutional: He is oriented to person, place, and time. He appears well-developed and well-nourished. No distress.  HENT:  Head: Normocephalic and atraumatic.  Nose: Nose normal.  Eyes: Right eye exhibits no discharge. Left eye exhibits no discharge.  Neck: Normal range of motion. Neck supple.  Cardiovascular: Normal rate and regular rhythm.   No murmur heard. Pulmonary/Chest: Effort normal and breath sounds normal.  Abdominal: Soft. Bowel sounds are normal. There is no tenderness.  Musculoskeletal: He exhibits no edema.  Neurological: He is alert and oriented to person, place, and time.  Skin: Skin is warm and dry.  Psychiatric: He has a normal mood and affect.  Nursing note and vitals reviewed.   BP 129/109 mmHg  Pulse 73  Temp(Src) 98.1 F (36.7 C) (Oral)  Ht _0  (1.778 m)  Wt 172 lb 6 oz (78.189 kg)  BMI 24.73 kg/m2  SpO2 100% Wt Readings from Last 3 Encounters:  01/23/16 172 lb 6 oz (78.189 kg)  09/05/15 174 lb 8 oz (79.153 kg)  07/31/15 172 lb 2 oz (78.075 kg)     Lab Results  Component Value Date   WBC 7.8 09/05/2015   HGB 13.8 09/05/2015   HCT 40.2 09/05/2015   PLT 215.0 09/05/2015   GLUCOSE 83 09/05/2015   CHOL 174 09/05/2015   TRIG 322.0* 09/05/2015   HDL 36.40* 09/05/2015   LDLDIRECT 113.0 09/05/2015   LDLCALC 72 05/30/2015   ALT 21 09/05/2015   AST 20 09/05/2015   NA 139 09/05/2015   K 3.4* 09/05/2015   CL 101 09/05/2015   CREATININE 0.87 09/05/2015   BUN 12 09/05/2015   CO2 31 09/05/2015   TSH 1.25 09/05/2015    Lab Results  Component Value Date   TSH 1.25 09/05/2015   Lab Results  Component Value Date   WBC 7.8 09/05/2015   HGB 13.8 09/05/2015   HCT 40.2 09/05/2015   MCV 88.5 09/05/2015   PLT 215.0 09/05/2015   Lab Results    Component Value Date   NA 139 09/05/2015   K 3.4* 09/05/2015   CO2 31 09/05/2015   GLUCOSE 83 09/05/2015   BUN 12 09/05/2015   CREATININE 0.87 09/05/2015   BILITOT 0.3 09/05/2015   ALKPHOS 71 09/05/2015   AST 20 09/05/2015   ALT 21 09/05/2015   PROT 6.9 09/05/2015   ALBUMIN 4.4 09/05/2015   CALCIUM 9.5 09/05/2015   GFR 100.26 09/05/2015  Lab Results  Component Value Date   CHOL 174 09/05/2015   Lab Results  Component Value Date   HDL 36.40* 09/05/2015   Lab Results  Component Value Date   LDLCALC 72 05/30/2015   Lab Results  Component Value Date   TRIG 322.0* 09/05/2015   Lab Results  Component Value Date   CHOLHDL 5 09/05/2015   No results found for: HGBA1C     Assessment & Plan:   Problem List Items Addressed This Visit    Elevated BP    Improved on recheck. Encouraged heart healthy diet such as the DASH diet and exercise as tolerated.       Hypogonadism in male    Will needs labs with net blood work.       Neck pain on left side    Continues to struggle with daily pain. Manages by staying busy and current meds.       Osteoarthritis of both knees - Primary    Follows with [facility], Dr Reginold Agent, needs joint replacement, has daily pain      Relevant Orders   C-reactive protein   Comprehensive metabolic panel   TSH   CBC   Lipid panel   Sed Rate (ESR)   Rheumatoid Factor   Tobacco abuse    Has been abstaining for roughly 6 months.  Encouraged ongoing efforts.                            Other Visit Diagnoses    Neck pain        Relevant Orders    C-reactive protein    Comprehensive metabolic panel    TSH    CBC    Lipid panel    Sed Rate (ESR)    Rheumatoid Factor    Elevated blood pressure (not hypertension)        Relevant Orders    Comprehensive metabolic panel    TSH    CBC    Lipid panel    Sed Rate (ESR)    Rheumatoid Factor    Low testosterone        Relevant Orders    Testosterone    Sed Rate (ESR)     Rheumatoid Factor       I have discontinued Mr. Ishii's nicotine and nicotine. I am also having him start on gabapentin. Additionally, I am having him maintain his cyclobenzaprine, ALPRAZolam, atorvastatin, and testosterone.  Meds ordered this encounter  Medications  . gabapentin (NEURONTIN) 300 MG capsule    Sig: Take 1 capsule (300 mg total) by mouth at bedtime.    Dispense:  30 capsule    Refill:  3     Penni Homans, MD

## 2016-02-09 ENCOUNTER — Other Ambulatory Visit: Payer: Self-pay | Admitting: Family Medicine

## 2016-02-09 DIAGNOSIS — Z720 Tobacco use: Secondary | ICD-10-CM

## 2016-02-09 DIAGNOSIS — E782 Mixed hyperlipidemia: Secondary | ICD-10-CM

## 2016-02-09 MED ORDER — TESTOSTERONE 50 MG/5GM (1%) TD GEL: TRANSDERMAL | Status: DC

## 2016-02-09 MED FILL — TESTOSTERONE 12.5 MG/1.25 G: 12.5 MG/ACT | 30 days supply | Qty: 300 | Fill #0

## 2016-02-09 NOTE — Telephone Encounter (Signed)
Phoned in prescription to [facility].

## 2016-02-09 NOTE — Telephone Encounter (Signed)
Relation to [id number] Call back [place]: [place], [place] [phone] (Phone) [phone] (Fax)         Reason for call:  Patient requesting a refill testosterone (ANDROGEL) 50 MG/5GM (1%) GEL

## 2016-02-12 ENCOUNTER — Other Ambulatory Visit (INDEPENDENT_AMBULATORY_CARE_PROVIDER_SITE_OTHER): Payer: Commercial Managed Care - PPO

## 2016-02-12 DIAGNOSIS — R946 Abnormal results of thyroid function studies: Secondary | ICD-10-CM | POA: Diagnosis not present

## 2016-02-12 DIAGNOSIS — E785 Hyperlipidemia, unspecified: Secondary | ICD-10-CM

## 2016-02-12 DIAGNOSIS — R030 Elevated blood-pressure reading, without diagnosis of hypertension: Secondary | ICD-10-CM | POA: Diagnosis not present

## 2016-02-12 DIAGNOSIS — IMO0001 Reserved for inherently not codable concepts without codable children: Secondary | ICD-10-CM

## 2016-02-12 DIAGNOSIS — E291 Testicular hypofunction: Secondary | ICD-10-CM | POA: Diagnosis not present

## 2016-02-12 DIAGNOSIS — R7989 Other specified abnormal findings of blood chemistry: Secondary | ICD-10-CM

## 2016-02-12 LAB — TESTOSTERONE: TESTOSTERONE: 388.8 ng/dL (ref 300.00–890.00)

## 2016-02-12 LAB — COMPREHENSIVE METABOLIC PANEL
ALBUMIN: 4.2 g/dL (ref 3.5–5.2)
ALK PHOS: 71 U/L (ref 39–117)
ALT: 19 U/L (ref 0–53)
AST: 16 U/L (ref 0–37)
BUN: 14 mg/dL (ref 6–23)
CO2: 32 mEq/L (ref 19–32)
CREATININE: 0.84 mg/dL (ref 0.40–1.50)
Calcium: 9.2 mg/dL (ref 8.4–10.5)
Chloride: 103 mEq/L (ref 96–112)
GFR: 104.2 mL/min (ref >60.00)
GLUCOSE: 90 mg/dL (ref 70–99)
Potassium: 3.8 mEq/L (ref 3.5–5.1)
SODIUM: 141 meq/L (ref 135–145)
TOTAL PROTEIN: 6.4 g/dL (ref 6.0–8.3)
Total Bilirubin: 0.5 mg/dL (ref 0.2–1.2)

## 2016-02-12 LAB — T4, FREE: Free T4: 1.16 ng/dL (ref 0.60–1.60)

## 2016-02-12 LAB — CBC
HCT: 40.3 % (ref 39.0–52.0)
HEMOGLOBIN: 13.8 g/dL (ref 13.0–17.0)
MCHC: 34.2 g/dL (ref 30.0–36.0)
MCV: 86.9 fl (ref 78.0–100.0)
Platelets: 194 10*3/uL (ref 150.0–400.0)
RBC: 4.64 Mil/uL (ref 4.22–5.81)
RDW: 12.4 % (ref 11.5–15.5)
WBC: 7.4 10*3/uL (ref 4.0–10.5)

## 2016-02-12 LAB — LIPID PANEL
CHOLESTEROL: 158 mg/dL (ref 0–200)
HDL: 34.7 mg/dL — ABNORMAL LOW (ref >39.00)
LDL CALC: 98 mg/dL (ref 0–99)
NONHDL: 123.5
Total CHOL/HDL Ratio: 5
Triglycerides: 129 mg/dL (ref 0.0–149.0)
VLDL: 25.8 mg/dL (ref 0.0–40.0)

## 2016-02-12 LAB — TSH: TSH: 0.03 u[IU]/mL — AB (ref 0.35–4.50)

## 2016-02-13 ENCOUNTER — Other Ambulatory Visit: Payer: Self-pay | Admitting: Family Medicine

## 2016-02-13 DIAGNOSIS — R946 Abnormal results of thyroid function studies: Secondary | ICD-10-CM

## 2016-02-14 ENCOUNTER — Other Ambulatory Visit (INDEPENDENT_AMBULATORY_CARE_PROVIDER_SITE_OTHER): Payer: Commercial Managed Care - PPO

## 2016-02-14 DIAGNOSIS — M1990 Unspecified osteoarthritis, unspecified site: Secondary | ICD-10-CM | POA: Diagnosis not present

## 2016-02-14 LAB — RHEUMATOID FACTOR: Rhuematoid fact SerPl-aCnc: <10 IU/mL (ref <=14)

## 2016-02-14 LAB — C-REACTIVE PROTEIN: CRP: 0.3 mg/dL — ABNORMAL LOW (ref 0.5–20.0)

## 2016-02-14 LAB — SEDIMENTATION RATE: SED RATE: 12 mm/h (ref 0–22)

## 2016-02-22 ENCOUNTER — Other Ambulatory Visit: Payer: Self-pay | Admitting: Family Medicine

## 2016-02-22 DIAGNOSIS — Z720 Tobacco use: Secondary | ICD-10-CM

## 2016-02-22 DIAGNOSIS — E782 Mixed hyperlipidemia: Secondary | ICD-10-CM

## 2016-02-22 MED ORDER — HYDROCODONE-ACETAMINOPHEN 10-325 MG PO TABS: 1.0000 | ORAL_TABLET | Freq: Three times a day (TID) | ORAL | Status: DC | PRN

## 2016-02-22 NOTE — Telephone Encounter (Signed)
Requesting: Hydrocodone Contract  Signed on 06/30/2015 UDS  Low risk, next due in April Last OV  01/23/2016 Last Refill   #60 with 0 refills on 01/29/2016 Please Advise

## 2016-02-22 NOTE — Telephone Encounter (Signed)
Caller name: Self  Can be reached: [phone]   Reason for call: Refill HYDROcodone-acetaminophen (NORCO) 10-325 MG tablet [id number]

## 2016-02-23 MED FILL — HYDROCODON-APAP 10-325: 10-325 | 20 days supply | Qty: 60 | Fill #0

## 2016-02-23 NOTE — Telephone Encounter (Signed)
Patient contacted to pickup hardcopy at the front desk.

## 2016-03-21 ENCOUNTER — Other Ambulatory Visit: Payer: Self-pay | Admitting: Family Medicine

## 2016-03-21 DIAGNOSIS — Z720 Tobacco use: Secondary | ICD-10-CM

## 2016-03-21 DIAGNOSIS — E782 Mixed hyperlipidemia: Secondary | ICD-10-CM

## 2016-03-21 MED ORDER — TESTOSTERONE 50 MG/5GM (1%) TD GEL
TRANSDERMAL | Status: DC
Start: 2016-03-21 — End: 2016-05-13

## 2016-03-21 MED ORDER — HYDROCODONE-ACETAMINOPHEN 10-325 MG PO TABS: 1.0000 | ORAL_TABLET | Freq: Three times a day (TID) | ORAL | Status: DC | PRN

## 2016-03-21 MED FILL — TESTOSTERONE 12.5 MG/1.25 G: 12.5 MG/ACT | 30 days supply | Qty: 300 | Fill #0

## 2016-03-21 MED FILL — HYDROCODON-APAP 10-325: 10-325 | 20 days supply | Qty: 60 | Fill #0

## 2016-03-21 NOTE — Telephone Encounter (Signed)
Can be reached: [phone] Pharmacy: [place], [place]  Reason for call: Pt needing refill on androgel sent to pharmacy downstairs. He has a day left.   Pt needing refill on hydrocodone. Has 6 left. Takes 2-3/day. Pt would like to pick up RX when he comes to [place] for the androgel. Please call when ready.

## 2016-03-21 NOTE — Telephone Encounter (Signed)
Requesting:  Hydrocodone Contract  06/29/2015 UDS   Low risk due now Last OV   01/23/2016 Last Refill   #60 with 0 refills on 02/22/2016  Please Advise

## 2016-03-21 NOTE — Telephone Encounter (Signed)
He can have a refill on both meds but he is due for a drug screen so please have them do that.

## 2016-03-21 NOTE — Telephone Encounter (Signed)
Printed both and on counter for signature. Called the patient left a detailed message prescriptions are ready for pickup/and to do UDS

## 2016-04-04 ENCOUNTER — Other Ambulatory Visit: Payer: Self-pay | Admitting: Family Medicine

## 2016-04-04 MED FILL — NAPROXEN 500 MG TABLET: 500 | 30 days supply | Qty: 60 | Fill #0

## 2016-04-18 ENCOUNTER — Other Ambulatory Visit: Payer: Self-pay | Admitting: Family Medicine

## 2016-04-18 DIAGNOSIS — E782 Mixed hyperlipidemia: Secondary | ICD-10-CM

## 2016-04-18 DIAGNOSIS — Z720 Tobacco use: Secondary | ICD-10-CM

## 2016-04-18 MED ORDER — HYDROCODONE-ACETAMINOPHEN 10-325 MG PO TABS: 1.0000 | ORAL_TABLET | Freq: Three times a day (TID) | ORAL | Status: DC | PRN

## 2016-04-18 MED FILL — HYDROCODON-APAP 10-325: 10-325 | 20 days supply | Qty: 60 | Fill #0

## 2016-04-18 NOTE — Telephone Encounter (Signed)
Refill approved, PCP signed and patient informed to pickup hardcopy at the front desk.

## 2016-04-18 NOTE — Telephone Encounter (Signed)
Can be reached: [phone]   Reason for call: Pt called for refill on hydrocodone. He is out. Took last pill last night. Takes 2-3/day. Please call when RX ready for pick up.

## 2016-04-18 NOTE — Telephone Encounter (Signed)
Requesting: Hydrocodone Contract  06/29/2015 UDS low risk, next due 7/17 Last OV  01/23/2016 Last Refill #60 with 0 refills on 03/21/2016  Please Advise

## 2016-04-25 ENCOUNTER — Other Ambulatory Visit: Payer: Commercial Managed Care - PPO

## 2016-05-13 ENCOUNTER — Other Ambulatory Visit: Payer: Self-pay | Admitting: Family Medicine

## 2016-05-13 DIAGNOSIS — Z720 Tobacco use: Secondary | ICD-10-CM

## 2016-05-13 DIAGNOSIS — E782 Mixed hyperlipidemia: Secondary | ICD-10-CM

## 2016-05-13 NOTE — Telephone Encounter (Signed)
Hydrocodone last refilled on 04/18/2016  #60 Testosterone  #2 on 03/21/2016 Last office visit 01/23/2016 Contract signed on 03/31/2014  No UDS

## 2016-05-13 NOTE — Telephone Encounter (Signed)
°  Relationship to patient: Self Can be reached: [phone]    Reason for call: Refills on HYDROcodone-acetaminophen (NORCO) 10-325 MG tablet [id number] and testosterone (ANDROGEL) 50 MG/5GM (1%) GEL [id number]

## 2016-05-13 NOTE — Telephone Encounter (Signed)
Please refill testosterone and Norco as requestedrobin

## 2016-05-14 MED ORDER — HYDROCODONE-ACETAMINOPHEN 10-325 MG PO TABS: 1.0000 | ORAL_TABLET | Freq: Three times a day (TID) | ORAL | Status: DC | PRN

## 2016-05-14 MED ORDER — TESTOSTERONE 50 MG/5GM (1%) TD GEL: TRANSDERMAL | Status: DC

## 2016-05-14 MED FILL — TESTOSTERONE 12.5 MG/1.25 G: 12.5 MG/ACT | 30 days supply | Qty: 300 | Fill #0

## 2016-05-14 MED FILL — HYDROCODON-APAP 10-325: 10-325 | 20 days supply | Qty: 60 | Fill #0

## 2016-05-14 NOTE — Telephone Encounter (Signed)
Printed and on counter for signature. Called the patient informed to pickup at the front desk

## 2016-05-29 ENCOUNTER — Encounter: Payer: Self-pay | Admitting: Family Medicine

## 2016-05-31 MED FILL — NAPROXEN 500 MG TABLET: 500 | 30 days supply | Qty: 60 | Fill #1

## 2016-06-11 ENCOUNTER — Other Ambulatory Visit: Payer: Self-pay | Admitting: Family Medicine

## 2016-06-11 DIAGNOSIS — Z720 Tobacco use: Secondary | ICD-10-CM

## 2016-06-11 DIAGNOSIS — E782 Mixed hyperlipidemia: Secondary | ICD-10-CM

## 2016-06-11 MED ORDER — HYDROCODONE-ACETAMINOPHEN 10-325 MG PO TABS: 1.0000 | ORAL_TABLET | Freq: Three times a day (TID) | ORAL | Status: DC | PRN

## 2016-06-11 NOTE — Telephone Encounter (Signed)
Requesting:  Hydrocodone Contract  06/29/2015 UDS   Low risk Last OV   01/23/2016 Last Refill   #60 with 0 refills on 05/14/2016  Please Advise

## 2016-06-11 NOTE — Telephone Encounter (Signed)
°  Relationship to patient: Self Can be reached: [phone]     Reason for call: Request refill on HYDROcodone-acetaminophen (NORCO) 10-325 MG tablet [id number]

## 2016-06-13 MED FILL — HYDROCODON-APAP 10-325: 10-325 | 20 days supply | Qty: 60 | Fill #0

## 2016-06-13 NOTE — Telephone Encounter (Signed)
Printed and on counter for signature. Called the patient informed hardcopy is ready for pickup at the front desk.

## 2016-07-16 ENCOUNTER — Encounter: Payer: Self-pay | Admitting: Family Medicine

## 2016-07-16 ENCOUNTER — Ambulatory Visit (INDEPENDENT_AMBULATORY_CARE_PROVIDER_SITE_OTHER): Payer: Commercial Managed Care - PPO | Admitting: Family Medicine

## 2016-07-16 VITALS — BP 130/72 | HR 98 | Temp 98.4°F | Resp 16 | Ht 68.0 in | Wt 180.4 lb

## 2016-07-16 DIAGNOSIS — R7989 Other specified abnormal findings of blood chemistry: Secondary | ICD-10-CM

## 2016-07-16 DIAGNOSIS — R030 Elevated blood-pressure reading, without diagnosis of hypertension: Secondary | ICD-10-CM | POA: Diagnosis not present

## 2016-07-16 DIAGNOSIS — IMO0001 Reserved for inherently not codable concepts without codable children: Secondary | ICD-10-CM

## 2016-07-16 DIAGNOSIS — Z0000 Encounter for general adult medical examination without abnormal findings: Secondary | ICD-10-CM | POA: Diagnosis not present

## 2016-07-16 DIAGNOSIS — Z87891 Personal history of nicotine dependence: Secondary | ICD-10-CM

## 2016-07-16 DIAGNOSIS — Z720 Tobacco use: Secondary | ICD-10-CM

## 2016-07-16 DIAGNOSIS — E291 Testicular hypofunction: Secondary | ICD-10-CM | POA: Diagnosis not present

## 2016-07-16 DIAGNOSIS — M542 Cervicalgia: Secondary | ICD-10-CM

## 2016-07-16 DIAGNOSIS — M546 Pain in thoracic spine: Secondary | ICD-10-CM | POA: Diagnosis not present

## 2016-07-16 DIAGNOSIS — E782 Mixed hyperlipidemia: Secondary | ICD-10-CM | POA: Diagnosis not present

## 2016-07-16 DIAGNOSIS — E785 Hyperlipidemia, unspecified: Secondary | ICD-10-CM

## 2016-07-16 MED ORDER — HYDROCODONE-ACETAMINOPHEN 10-325 MG PO TABS: 1.0000 | ORAL_TABLET | Freq: Four times a day (QID) | ORAL | 0 refills | Status: DC | PRN

## 2016-07-16 MED FILL — HYDROCODON-APAP 10-325: 10-325 | 30 days supply | Qty: 120 | Fill #0

## 2016-07-16 NOTE — Assessment & Plan Note (Signed)
Has been off of cigarettes since 08/02/2015. Is using low dose vaporizer, encouraged to try to stop the vaporizer over the next year.

## 2016-07-16 NOTE — Progress Notes (Signed)
Pre visit review using our clinic review tool, if applicable. No additional management support is needed unless otherwise documented below in the visit note. 

## 2016-07-16 NOTE — Assessment & Plan Note (Signed)
Patient encouraged to maintain heart healthy diet, regular exercise, adequate sleep. Consider daily probiotics. Take medications as prescribed. Given and reviewed copy of ACP documents from [place] Secretary of State and encouraged to complete and return 

## 2016-07-16 NOTE — Assessment & Plan Note (Signed)
Encouraged heart healthy diet, increase exercise, avoid trans fats, consider a krill oil cap daily 

## 2016-07-16 NOTE — Patient Instructions (Signed)

## 2016-07-16 NOTE — Assessment & Plan Note (Signed)
Well controlled, no changes to meds. Encouraged heart healthy diet such as the DASH diet and exercise as tolerated.  °

## 2016-07-17 LAB — COMPREHENSIVE METABOLIC PANEL
ALT: 21 U/L (ref 0–53)
AST: 16 U/L (ref 0–37)
Albumin: 4.7 g/dL (ref 3.5–5.2)
Alkaline Phosphatase: 75 U/L (ref 39–117)
BUN: 15 mg/dL (ref 6–23)
CHLORIDE: 101 meq/L (ref 96–112)
CO2: 30 meq/L (ref 19–32)
CREATININE: 0.91 mg/dL (ref 0.40–1.50)
Calcium: 9.9 mg/dL (ref 8.4–10.5)
GFR: 94.83 mL/min (ref >60.00)
GLUCOSE: 76 mg/dL (ref 70–99)
POTASSIUM: 3.7 meq/L (ref 3.5–5.1)
SODIUM: 139 meq/L (ref 135–145)
Total Bilirubin: 0.4 mg/dL (ref 0.2–1.2)
Total Protein: 7.4 g/dL (ref 6.0–8.3)

## 2016-07-17 LAB — LIPID PANEL
Cholesterol: 194 mg/dL (ref 0–200)
HDL: 38.8 mg/dL — AB (ref >39.00)
NONHDL: 154.98
Total CHOL/HDL Ratio: 5
Triglycerides: 296 mg/dL — ABNORMAL HIGH (ref 0.0–149.0)
VLDL: 59.2 mg/dL — AB (ref 0.0–40.0)

## 2016-07-17 LAB — CBC
HEMATOCRIT: 41.5 % (ref 39.0–52.0)
HEMOGLOBIN: 14.3 g/dL (ref 13.0–17.0)
MCHC: 34.5 g/dL (ref 30.0–36.0)
MCV: 87.3 fl (ref 78.0–100.0)
PLATELETS: 250 10*3/uL (ref 150.0–400.0)
RBC: 4.76 Mil/uL (ref 4.22–5.81)
RDW: 13 % (ref 11.5–15.5)
WBC: 9.9 10*3/uL (ref 4.0–10.5)

## 2016-07-17 LAB — LDL CHOLESTEROL, DIRECT: Direct LDL: 124 mg/dL

## 2016-07-17 LAB — TSH: TSH: 2.35 u[IU]/mL (ref 0.35–4.50)

## 2016-07-17 LAB — TESTOSTERONE: TESTOSTERONE: 236.75 ng/dL — AB (ref 300.00–890.00)

## 2016-07-18 ENCOUNTER — Telehealth: Payer: Self-pay | Admitting: Family Medicine

## 2016-07-18 NOTE — Telephone Encounter (Signed)
Pt is returning CMA's call for lab results.

## 2016-07-28 NOTE — Assessment & Plan Note (Signed)
Encouraged moist heat and gentle stretching as tolerated. May try NSAIDs and prescription meds as directed and report if symptoms worsen or seek immediate care. Continue meds prn. Follow up iwht neurosurgery, Dr Joya Salm as needed.

## 2016-07-28 NOTE — Assessment & Plan Note (Signed)
Tolerating testosterone supplements and finds it helpful.

## 2016-07-28 NOTE — Progress Notes (Signed)
Patient ID: Elijah Marquez, male   DOB: 1971/01/06, 45 y.o.   MRN: [id number]   Subjective:    Patient ID: Elijah Marquez, male    DOB: [birth year], 45 y.o.   MRN: [id number]  Chief Complaint  Patient presents with  . Annual Exam    patient would like his hydrocodone adjusted.     HPI Patient is in today for annual preventative exam and follow up on numeorus medical concerns. Continues to avoid cigarettes and notes he feels better and less SOB as a result. He has gotten a new job and while he still struggles with chronic pain due to neck/back injury at previous job overall he is doing well and is pleased with his progress. No acute illness, hospitalizations or acute concerns. Is taking atprvastatin as prescribed for his cholesterol. Denies CP/palp/SOB/HA/congestion/fevers/GI or GU c/o. Taking meds as prescribed  Past Medical History:  Diagnosis Date  . Anxiety 08/23/2012  . Anxiety as acute reaction to exceptional stress 09/20/2012  . Back pain 07/29/2013  . Chicken pox as a child  . Fatigue   . History of tobacco abuse    Failed Chantix and wellbutrin   . Hyperlipidemia   . Hyperlipidemia 08/23/2012  . Hypogonadism in male 10/02/2014  . Intermittent claudication (HCC) 08/13/2015   B/l legs  . Neck pain on left side 08/23/2012  . Osteoarthritis of both knees   . Osteoarthritis of left knee    and right knee  . Tobacco abuse     Past Surgical History:  Procedure Laterality Date  . KNEE SURGERY     right knee 2012, left knee 2011 (arthroscopy)  . NECK SURGERY  12/08/12  . WISDOM TOOTH EXTRACTION      Family History  Problem Relation Age of Onset  . Fibromyalgia Mother   . Coronary artery disease Mother   . Hypertension Mother   . Heart attack Mother   . Heart disease Mother     CAD  . Peripheral vascular disease Mother 14    hemorrhage from vascular procedure  . Fibromyalgia Sister   . Hypertension Sister   . Heart disease Sister   . Other Son     mildly mentally handicap    . Seizures Son   . Lupus Sister   . Emphysema Sister     smoker  . Kidney disease Daughter   . Alcohol abuse Father     Social History   Social History  . Marital status: Married    Spouse name: N/A  . Number of children: N/A  . Years of education: N/A   Occupational History  . Not on file.   Social History Main Topics  . Smoking status: Former Smoker    Packs/day: 1.50    Years: 25.00    Types: Cigarettes    Quit date: 07/31/2015  . Smokeless tobacco: Never Used  . Alcohol use No  . Drug use: No  . Sexual activity: Yes    Partners: Female   Other Topics Concern  . Not on file   Social History Narrative  . No narrative on file    Outpatient Medications Prior to Visit  Medication Sig Dispense Refill  . ALPRAZolam (XANAX) 1 MG tablet Take 1 tablet (1 mg total) by mouth 2 (two) times daily as needed for sleep or anxiety. 60 tablet 2  . cyclobenzaprine (FLEXERIL) 10 MG tablet Take 1 tablet (10 mg total) by mouth 3 (three) times daily as needed for muscle spasms. 30 tablet 5  .  naproxen (NAPROSYN) 500 MG tablet TAKE 1 TABLET (500 MG) BY MOUTH 2 TIMES A DAY WITH A MEAL. 60 tablet 5  . testosterone (ANDROGEL) 50 MG/5GM (1%) GEL 8 pumps daily 2 Package 0  . atorvastatin (LIPITOR) 20 MG tablet Take 1 tablet (20 mg total) by mouth at bedtime. 30 tablet 5  . gabapentin (NEURONTIN) 300 MG capsule Take 1 capsule (300 mg total) by mouth at bedtime. 30 capsule 3  . HYDROcodone-acetaminophen (NORCO) 10-325 MG tablet Take 1 tablet by mouth every 8 (eight) hours as needed for moderate pain or severe pain. 60 tablet 0   No facility-administered medications prior to visit.     Allergies  Allergen Reactions  . Penicillins Swelling and Rash    Review of Systems  Constitutional: Negative for fever and malaise/fatigue.  HENT: Negative for congestion.   Eyes: Negative for blurred vision.  Respiratory: Negative for shortness of breath.   Cardiovascular: Negative for chest pain,  palpitations and leg swelling.  Gastrointestinal: Negative for abdominal pain, blood in stool and nausea.  Genitourinary: Negative for dysuria and frequency.  Musculoskeletal: Positive for back pain and neck pain. Negative for falls.  Skin: Negative for rash.  Neurological: Negative for dizziness, loss of consciousness and headaches.  Endo/Heme/Allergies: Negative for environmental allergies.  Psychiatric/Behavioral: Negative for depression. The patient is nervous/anxious.        Objective:    Physical Exam  Constitutional: He is oriented to person, place, and time. He appears well-developed and well-nourished. No distress.  HENT:  Head: Normocephalic and atraumatic.  Eyes: Conjunctivae are normal.  Neck: Neck supple. No thyromegaly present.  Cardiovascular: Normal rate, regular rhythm and normal heart sounds.   No murmur heard. Pulmonary/Chest: Effort normal and breath sounds normal. No respiratory distress. He has no wheezes.  Abdominal: Soft. Bowel sounds are normal. He exhibits no mass. There is no tenderness.  Musculoskeletal: He exhibits no edema.  Lymphadenopathy:    He has no cervical adenopathy.  Neurological: He is alert and oriented to person, place, and time.  Skin: Skin is warm and dry.  Psychiatric: He has a normal mood and affect. His behavior is normal.    BP 130/72 (BP Location: Right Arm, Patient Position: Sitting, Cuff Size: Normal)   Pulse 98   Temp 98.4 F (36.9 C) (Oral)   Resp 16   Ht 5\' 8"  (1.727 m)   Wt 180 lb 6.4 oz (81.8 kg)   SpO2 98%   BMI 27.43 kg/m  Wt Readings from Last 3 Encounters:  07/16/16 180 lb 6.4 oz (81.8 kg)  01/23/16 172 lb 6 oz (78.2 kg)  09/05/15 174 lb 8 oz (79.2 kg)     Lab Results  Component Value Date   WBC 9.9 07/16/2016   HGB 14.3 07/16/2016   HCT 41.5 07/16/2016   PLT 250.0 07/16/2016   GLUCOSE 76 07/16/2016   CHOL 194 07/16/2016   TRIG 296.0 (H) 07/16/2016   HDL 38.80 (L) 07/16/2016   LDLDIRECT 124.0  07/16/2016   LDLCALC 98 02/12/2016   ALT 21 07/16/2016   AST 16 07/16/2016   NA 139 07/16/2016   K 3.7 07/16/2016   CL 101 07/16/2016   CREATININE 0.91 07/16/2016   BUN 15 07/16/2016   CO2 30 07/16/2016   TSH 2.35 07/16/2016    Lab Results  Component Value Date   TSH 2.35 07/16/2016   Lab Results  Component Value Date   WBC 9.9 07/16/2016   HGB 14.3 07/16/2016   HCT  41.5 07/16/2016   MCV 87.3 07/16/2016   PLT 250.0 07/16/2016   Lab Results  Component Value Date   NA 139 07/16/2016   K 3.7 07/16/2016   CO2 30 07/16/2016   GLUCOSE 76 07/16/2016   BUN 15 07/16/2016   CREATININE 0.91 07/16/2016   BILITOT 0.4 07/16/2016   ALKPHOS 75 07/16/2016   AST 16 07/16/2016   ALT 21 07/16/2016   PROT 7.4 07/16/2016   ALBUMIN 4.7 07/16/2016   CALCIUM 9.9 07/16/2016   GFR 94.83 07/16/2016   Lab Results  Component Value Date   CHOL 194 07/16/2016   Lab Results  Component Value Date   HDL 38.80 (L) 07/16/2016   Lab Results  Component Value Date   LDLCALC 98 02/12/2016   Lab Results  Component Value Date   TRIG 296.0 (H) 07/16/2016   Lab Results  Component Value Date   CHOLHDL 5 07/16/2016   No results found for: HGBA1C     Assessment & Plan:   Problem List Items Addressed This Visit    History of tobacco abuse    Has been off of cigarettes since 08/02/2015. Is using low dose vaporizer, encouraged to try to stop the vaporizer over the next year.       Relevant Medications   HYDROcodone-acetaminophen (NORCO) 10-325 MG tablet   Neck pain on left side   Preventative health care    Patient encouraged to maintain heart healthy diet, regular exercise, adequate sleep. Consider daily probiotics. Take medications as prescribed. Given and reviewed copy of ACP documents from [employer] and encouraged to complete and return      Hyperlipidemia    Encouraged heart healthy diet, increase exercise, avoid trans fats, consider a krill oil cap daily      Relevant  Orders   Lipid panel (Completed)   Back pain - Primary    Encouraged moist heat and gentle stretching as tolerated. May try NSAIDs and prescription meds as directed and report if symptoms worsen or seek immediate care. Continue meds prn. Follow up iwht neurosurgery, Dr Joya Salm as needed.      Relevant Medications   HYDROcodone-acetaminophen (NORCO) 10-325 MG tablet   Elevated BP    Well controlled, no changes to meds. Encouraged heart healthy diet such as the DASH diet and exercise as tolerated.       Relevant Orders   TSH (Completed)   CBC (Completed)   Comprehensive metabolic panel (Completed)   Hypogonadism in male    Tolerating testosterone supplements and finds it helpful.       Other Visit Diagnoses    Tobacco abuse       Relevant Medications   HYDROcodone-acetaminophen (NORCO) 10-325 MG tablet   Hyperlipidemia, mixed       Relevant Medications   HYDROcodone-acetaminophen (NORCO) 10-325 MG tablet   Low testosterone       Relevant Orders   Testosterone (Completed)      I have discontinued Mr. Pharris's atorvastatin and gabapentin. I have also changed his HYDROcodone-acetaminophen. Additionally, I am having him maintain his cyclobenzaprine, ALPRAZolam, naproxen, and testosterone.  Meds ordered this encounter  Medications  . HYDROcodone-acetaminophen (NORCO) 10-325 MG tablet    Sig: Take 1 tablet by mouth every 6 (six) hours as needed for moderate pain or severe pain.    Dispense:  120 tablet    Refill:  0     Penni Homans, MD

## 2016-08-14 ENCOUNTER — Other Ambulatory Visit: Payer: Self-pay | Admitting: Family Medicine

## 2016-08-14 DIAGNOSIS — E782 Mixed hyperlipidemia: Secondary | ICD-10-CM

## 2016-08-14 DIAGNOSIS — Z720 Tobacco use: Secondary | ICD-10-CM

## 2016-08-14 NOTE — Telephone Encounter (Signed)
Relation to [id number] Call back [place]:  Reason for call:  Patient requesting a refill HYDROcodone-acetaminophen (NORCO) 10-325 MG tablet and testosterone (ANDROGEL) 50 MG/5GM (1%) GEL

## 2016-08-15 MED ORDER — TESTOSTERONE 50 MG/5GM (1%) TD GEL: TRANSDERMAL | 0 refills | Status: DC

## 2016-08-15 MED ORDER — HYDROCODONE-ACETAMINOPHEN 10-325 MG PO TABS: 1.0000 | ORAL_TABLET | Freq: Four times a day (QID) | ORAL | 0 refills | Status: DC | PRN

## 2016-08-15 MED FILL — HYDROCODON-APAP 10-325: 10-325 | 30 days supply | Qty: 120 | Fill #0

## 2016-08-15 NOTE — Telephone Encounter (Signed)
Requesting:  Hydrocodone and testosterone Contract signed on 06/29/2015 UDS  Low risk Last OV  07/16/2016 Last Refill  Hydrocodone  #120 with 0 refills on 07/16/2016                    Testosterone  #2 packs 05/14/2016  Please Advise

## 2016-08-15 NOTE — Telephone Encounter (Signed)
Called the patient informed hardcopy's are ready for pickup at the front desk. 

## 2016-08-20 ENCOUNTER — Telehealth: Payer: Self-pay

## 2016-08-21 MED FILL — TESTOSTERONE 12.5 MG/1.25 G: 12.5 MG/ACT | 30 days supply | Qty: 300 | Fill #0

## 2016-08-22 NOTE — Telephone Encounter (Signed)
Received PA Approval  Through 08/21/2017 PA Reference # [id number] Patient ID# [id number] Patient notified of approval. Numbered approval/DOB/name/PCP and sent to scan.

## 2016-08-30 MED FILL — NAPROXEN 500 MG TABLET: 500 | 30 days supply | Qty: 60 | Fill #2

## 2016-09-12 ENCOUNTER — Telehealth: Payer: Self-pay | Admitting: Family Medicine

## 2016-09-12 DIAGNOSIS — Z720 Tobacco use: Secondary | ICD-10-CM

## 2016-09-12 DIAGNOSIS — E782 Mixed hyperlipidemia: Secondary | ICD-10-CM

## 2016-09-12 MED ORDER — HYDROCODONE-ACETAMINOPHEN 10-325 MG PO TABS: 1.0000 | ORAL_TABLET | Freq: Four times a day (QID) | ORAL | 0 refills | Status: DC | PRN

## 2016-09-12 NOTE — Telephone Encounter (Signed)
Caller name: Relationship to patient: Self Can be reached: [phone]  Pharmacy:  Reason for call: Refill HYDROcodone-acetaminophen ([place]) 10-325 MG tablet [id number]

## 2016-09-12 NOTE — Telephone Encounter (Signed)
As long as it has been 30 days he can have a refill on requested med

## 2016-09-12 NOTE — Telephone Encounter (Signed)
Last refill #120 on 08/15/2016 Printed and on counter for signature. Patient informed to pickup hardcopy

## 2016-09-13 MED FILL — HYDROCODON-APAP 10-325: 10-325 | 30 days supply | Qty: 120 | Fill #0

## 2016-10-15 ENCOUNTER — Other Ambulatory Visit: Payer: Self-pay | Admitting: Family Medicine

## 2016-10-15 DIAGNOSIS — Z720 Tobacco use: Secondary | ICD-10-CM

## 2016-10-15 DIAGNOSIS — F419 Anxiety disorder, unspecified: Principal | ICD-10-CM

## 2016-10-15 DIAGNOSIS — F329 Major depressive disorder, single episode, unspecified: Secondary | ICD-10-CM

## 2016-10-15 DIAGNOSIS — E782 Mixed hyperlipidemia: Secondary | ICD-10-CM

## 2016-10-15 DIAGNOSIS — F32A Depression, unspecified: Secondary | ICD-10-CM

## 2016-10-15 MED ORDER — ALPRAZOLAM 1 MG PO TABS: 1.0000 mg | ORAL_TABLET | Freq: Two times a day (BID) | ORAL | 2 refills | Status: DC | PRN

## 2016-10-15 MED ORDER — HYDROCODONE-ACETAMINOPHEN 10-325 MG PO TABS: 1.0000 | ORAL_TABLET | Freq: Four times a day (QID) | ORAL | 0 refills | Status: DC | PRN

## 2016-10-15 MED ORDER — TESTOSTERONE 50 MG/5GM (1%) TD GEL: TRANSDERMAL | 0 refills | Status: DC

## 2016-10-15 MED FILL — HYDROCODON-APAP 10-325: 10-325 | 30 days supply | Qty: 120 | Fill #0

## 2016-10-15 MED FILL — ALPRAZolam 1 MG TABS: 1 | 30 days supply | Qty: 60 | Fill #0

## 2016-10-15 MED FILL — TESTOSTERONE 12.5 MG/1.25 G: 12.5 MG/ACT | 30 days supply | Qty: 300 | Fill #0

## 2016-10-15 NOTE — Telephone Encounter (Signed)
Relation to [id number] Call back [phone]   Reason for call:  Patient requesting a refill testosterone (ANDROGEL) 50 MG/5GM (1%) GEL HYDROcodone-acetaminophen (NORCO) 10-325 MG tablet and ALPRAZolam (XANAX) 1 MG tablet

## 2016-10-15 NOTE — Telephone Encounter (Signed)
Requesting: Alprazolam , hydrocodone and testosterone gel Contract   06/29/2015 UDS  Low risk, next was due 09/20/2016 Last OV   07/16/2016 Last Refill--Alprazolam   #60 with 2 refills 10/31/2014                   Hydrocodone   #120 with 0 refills on 09/12/2016                   Testosterone Gel   2 packages 0 refills on 08/15/2016 Please Advise

## 2016-10-15 NOTE — Telephone Encounter (Signed)
Called the patient left a detailed message (prescriptions all printed on same page) that hardcopy is ready for pickup for all 3 medication refills.

## 2016-11-15 ENCOUNTER — Other Ambulatory Visit: Payer: Self-pay | Admitting: Family Medicine

## 2016-11-15 DIAGNOSIS — Z720 Tobacco use: Secondary | ICD-10-CM

## 2016-11-15 DIAGNOSIS — E782 Mixed hyperlipidemia: Secondary | ICD-10-CM

## 2016-11-15 MED ORDER — HYDROCODONE-ACETAMINOPHEN 10-325 MG PO TABS: 1.0000 | ORAL_TABLET | Freq: Four times a day (QID) | ORAL | 0 refills | Status: DC | PRN

## 2016-11-15 MED FILL — HYDROCODON-APAP 10-325: 10-325 | 30 days supply | Qty: 120 | Fill #0

## 2016-11-15 NOTE — Telephone Encounter (Signed)
Requesting:  Hydrocodone Contract   03/31/2014 UDS    None Last OV    07/16/2016 Last Refill     #120  No refills on 10/15/2016  Please Advise

## 2016-11-15 NOTE — Telephone Encounter (Signed)
Called left message hardcopy is ready for pickup at the front desk.

## 2016-11-15 NOTE — Telephone Encounter (Signed)
Caller name: Relationship to patient: Self Can be reached: [phone]  Pharmacy:  Reason for call: Request refill on HYDROcodone-acetaminophen (NORCO) 10-325 MG tablet [id number]

## 2016-11-18 MED FILL — NAPROXEN 500 MG TABLET: 500 | 30 days supply | Qty: 60 | Fill #3

## 2016-12-16 ENCOUNTER — Other Ambulatory Visit: Payer: Self-pay | Admitting: Family Medicine

## 2016-12-16 DIAGNOSIS — Z720 Tobacco use: Secondary | ICD-10-CM

## 2016-12-16 DIAGNOSIS — E782 Mixed hyperlipidemia: Secondary | ICD-10-CM

## 2016-12-16 NOTE — Telephone Encounter (Signed)
Requesting  Hydrocodone Contract   06/29/2015 UDS    Low risk   03/21/2016 Last OV  07/16/2016 Last Refill    #120 with 0 refills 11/15/2016  Please Advise

## 2016-12-16 NOTE — Telephone Encounter (Signed)
Relation to [id number] Call back number:[phone]  Reason for call:  Patient requesting a refill testosterone (ANDROGEL) 50 MG/5GM (1%) GEL and HYDROcodone-acetaminophen (NORCO) 10-325 MG tablet

## 2016-12-16 NOTE — Telephone Encounter (Signed)
Ok to refill both meds but please update his contract.

## 2016-12-17 ENCOUNTER — Encounter: Payer: Self-pay | Admitting: Family Medicine

## 2016-12-17 MED ORDER — TESTOSTERONE 50 MG/5GM (1%) TD GEL: TRANSDERMAL | 0 refills | Status: DC

## 2016-12-17 MED ORDER — HYDROCODONE-ACETAMINOPHEN 10-325 MG PO TABS: 1.0000 | ORAL_TABLET | Freq: Four times a day (QID) | ORAL | 0 refills | Status: DC | PRN

## 2016-12-17 MED FILL — HYDROCODON-APAP 10-325: 10-325 | 30 days supply | Qty: 120 | Fill #0

## 2016-12-17 NOTE — Telephone Encounter (Signed)
Printed prescriptions/letter Called the patient left a detailed message hardcopy of both refills ready for pickup and complete/update letter

## 2016-12-20 ENCOUNTER — Telehealth: Payer: Self-pay

## 2016-12-20 NOTE — Telephone Encounter (Signed)
PA sent to plan. Awaiting response for Testosterone.

## 2017-01-10 MED FILL — TESTOSTERONE 12.5 MG/1.25 G: 12.5 MG/ACT | 30 days supply | Qty: 300 | Fill #0

## 2017-01-17 ENCOUNTER — Encounter: Payer: Self-pay | Admitting: Family Medicine

## 2017-01-17 ENCOUNTER — Ambulatory Visit (INDEPENDENT_AMBULATORY_CARE_PROVIDER_SITE_OTHER): Payer: BLUE CROSS/BLUE SHIELD | Admitting: Family Medicine

## 2017-01-17 VITALS — BP 120/80 | HR 75 | Temp 98.0°F | Wt 187.8 lb

## 2017-01-17 DIAGNOSIS — Z87891 Personal history of nicotine dependence: Secondary | ICD-10-CM

## 2017-01-17 DIAGNOSIS — E349 Endocrine disorder, unspecified: Secondary | ICD-10-CM | POA: Diagnosis not present

## 2017-01-17 DIAGNOSIS — R030 Elevated blood-pressure reading, without diagnosis of hypertension: Secondary | ICD-10-CM | POA: Diagnosis not present

## 2017-01-17 DIAGNOSIS — Z720 Tobacco use: Secondary | ICD-10-CM | POA: Diagnosis not present

## 2017-01-17 DIAGNOSIS — R7989 Other specified abnormal findings of blood chemistry: Secondary | ICD-10-CM

## 2017-01-17 DIAGNOSIS — M542 Cervicalgia: Secondary | ICD-10-CM

## 2017-01-17 DIAGNOSIS — E782 Mixed hyperlipidemia: Secondary | ICD-10-CM

## 2017-01-17 DIAGNOSIS — E291 Testicular hypofunction: Secondary | ICD-10-CM

## 2017-01-17 LAB — CBC
HCT: 41.5 % (ref 38.5–50.0)
HEMOGLOBIN: 14.1 g/dL (ref 13.2–17.1)
MCH: 29.4 pg (ref 27.0–33.0)
MCHC: 34 g/dL (ref 32.0–36.0)
MCV: 86.5 fL (ref 80.0–100.0)
MPV: 9.8 fL (ref 7.5–12.5)
PLATELETS: 237 10*3/uL (ref 140–400)
RBC: 4.8 MIL/uL (ref 4.20–5.80)
RDW: 13.4 % (ref 11.0–15.0)
WBC: 9.3 10*3/uL (ref 3.8–10.8)

## 2017-01-17 LAB — COMPREHENSIVE METABOLIC PANEL
ALK PHOS: 73 U/L (ref 40–115)
ALT: 25 U/L (ref 9–46)
AST: 19 U/L (ref 10–40)
Albumin: 4.5 g/dL (ref 3.6–5.1)
BUN: 14 mg/dL (ref 7–25)
CALCIUM: 9.7 mg/dL (ref 8.6–10.3)
CO2: 27 mmol/L (ref 20–31)
Chloride: 100 mmol/L (ref 98–110)
Creat: 1 mg/dL (ref 0.60–1.35)
GLUCOSE: 96 mg/dL (ref 65–99)
POTASSIUM: 3.7 mmol/L (ref 3.5–5.3)
Sodium: 137 mmol/L (ref 135–146)
Total Bilirubin: 0.3 mg/dL (ref 0.2–1.2)
Total Protein: 7.5 g/dL (ref 6.1–8.1)

## 2017-01-17 LAB — LIPID PANEL
CHOL/HDL RATIO: 7.3 ratio — AB (ref <5.0)
CHOLESTEROL: 182 mg/dL (ref <200)
HDL: 25 mg/dL — AB (ref >40)
LDL Cholesterol: 77 mg/dL (ref <100)
Triglycerides: 400 mg/dL — ABNORMAL HIGH (ref <150)
VLDL: 80 mg/dL — ABNORMAL HIGH (ref <30)

## 2017-01-17 LAB — TSH: TSH: 3.22 m[IU]/L (ref 0.40–4.50)

## 2017-01-17 MED ORDER — HYDROCODONE-ACETAMINOPHEN 10-325 MG PO TABS: 1.0000 | ORAL_TABLET | Freq: Four times a day (QID) | ORAL | 0 refills | Status: DC | PRN

## 2017-01-17 MED FILL — HYDROCODON-APAP 10-325: 10-325 | 30 days supply | Qty: 120 | Fill #0

## 2017-01-17 NOTE — Progress Notes (Signed)
Patient ID: Elijah Marquez, male   DOB: [birth year], 46 y.o.   MRN: [id number]   Subjective:    Patient ID: Elijah Marquez, male    DOB: 1971/11/19, 46 y.o.   MRN: [id number]  Chief Complaint  Patient presents with  . Follow-up  . Hyperlipidemia  I acted as a Education administrator for Dr. Charlett Blake. Princess, RMA   HPI  Patient is in today for follow up. He continues to do well in his new job. He feels well today. He notes ongoing trouble with his neck, back and arm since his injury several years ago but continue to manage to work full time and take minimal medications. No recent fall or injury. He is noting an improvement in his energy level since starting Androgel. No recent febrile illness or hospitalizations. Denies CP/palp/SOB/HA/congestion/fevers/GI or GU c/o. Taking meds as prescribed  Past Medical History:  Diagnosis Date  . Anxiety 08/23/2012  . Anxiety as acute reaction to exceptional stress 09/20/2012  . Back pain 07/29/2013  . Chicken pox as a child  . Fatigue   . History of tobacco abuse    Failed Chantix and wellbutrin   . Hyperlipidemia   . Hyperlipidemia 08/23/2012  . Hypogonadism in male 10/02/2014  . Intermittent claudication (HCC) 08/13/2015   B/l legs  . Neck pain on left side 08/23/2012  . Osteoarthritis of both knees   . Osteoarthritis of left knee    and right knee  . Tobacco abuse     Past Surgical History:  Procedure Laterality Date  . KNEE SURGERY     right knee 2012, left knee 2011 (arthroscopy)  . NECK SURGERY  12/08/12  . WISDOM TOOTH EXTRACTION      Family History  Problem Relation Age of Onset  . Fibromyalgia Mother   . Coronary artery disease Mother   . Hypertension Mother   . Heart attack Mother   . Heart disease Mother     CAD  . Peripheral vascular disease Mother 70    hemorrhage from vascular procedure  . Fibromyalgia Sister   . Hypertension Sister   . Heart disease Sister   . Other Son     mildly mentally handicap  . Seizures Son   . Lupus Sister     . Emphysema Sister     smoker  . Kidney disease Daughter   . Alcohol abuse Father     Social History   Social History  . Marital status: Married    Spouse name: N/A  . Number of children: N/A  . Years of education: N/A   Occupational History  . Not on file.   Social History Main Topics  . Smoking status: Former Smoker    Packs/day: 1.50    Years: 25.00    Types: Cigarettes    Quit date: 07/31/2015  . Smokeless tobacco: Never Used  . Alcohol use No  . Drug use: No  . Sexual activity: Yes    Partners: Female   Other Topics Concern  . Not on file   Social History Narrative  . No narrative on file    Outpatient Medications Prior to Visit  Medication Sig Dispense Refill  . ALPRAZolam (XANAX) 1 MG tablet Take 1 tablet (1 mg total) by mouth 2 (two) times daily as needed for sleep or anxiety. 60 tablet 2  . cyclobenzaprine (FLEXERIL) 10 MG tablet Take 1 tablet (10 mg total) by mouth 3 (three) times daily as needed for muscle spasms. 30 tablet 5  . naproxen (  NAPROSYN) 500 MG tablet TAKE 1 TABLET (500 MG) BY MOUTH 2 TIMES A DAY WITH A MEAL. 60 tablet 5  . testosterone (ANDROGEL) 50 MG/5GM (1%) GEL 8 pumps daily 2 Package 0  . HYDROcodone-acetaminophen (NORCO) 10-325 MG tablet Take 1 tablet by mouth every 6 (six) hours as needed for moderate pain or severe pain. 120 tablet 0   No facility-administered medications prior to visit.     Allergies  Allergen Reactions  . Penicillins Swelling and Rash    Review of Systems  Constitutional: Negative for fever and malaise/fatigue.  HENT: Negative for congestion.   Eyes: Negative for blurred vision.  Respiratory: Negative for shortness of breath.   Cardiovascular: Negative for chest pain, palpitations and leg swelling.  Gastrointestinal: Negative for abdominal pain, blood in stool and nausea.  Genitourinary: Negative for dysuria and frequency.  Musculoskeletal: Positive for back pain, joint pain and neck pain. Negative for  falls.  Skin: Negative for rash.  Neurological: Negative for dizziness, loss of consciousness and headaches.  Endo/Heme/Allergies: Negative for environmental allergies.  Psychiatric/Behavioral: Negative for depression. The patient is not nervous/anxious.        Objective:    Physical Exam  Constitutional: He is oriented to person, place, and time. He appears well-developed and well-nourished. No distress.  HENT:  Head: Normocephalic and atraumatic.  Nose: Nose normal.  Eyes: Right eye exhibits no discharge. Left eye exhibits no discharge.  Neck: Normal range of motion. Neck supple.  Cardiovascular: Normal rate and regular rhythm.   No murmur heard. Pulmonary/Chest: Effort normal and breath sounds normal.  Abdominal: Soft. Bowel sounds are normal. There is no tenderness.  Musculoskeletal: He exhibits no edema.  Neurological: He is alert and oriented to person, place, and time.  Skin: Skin is warm and dry.  Psychiatric: He has a normal mood and affect.  Nursing note and vitals reviewed.   BP 120/80 (BP Location: Left Arm, Patient Position: Sitting, Cuff Size: Normal)   Pulse 75   Temp 98 F (36.7 C) (Oral)   Wt 187 lb 12.8 oz (85.2 kg)   SpO2 95%   BMI 28.55 kg/m  Wt Readings from Last 3 Encounters:  01/17/17 187 lb 12.8 oz (85.2 kg)  07/16/16 180 lb 6.4 oz (81.8 kg)  01/23/16 172 lb 6 oz (78.2 kg)     Lab Results  Component Value Date   WBC 9.3 01/17/2017   HGB 14.1 01/17/2017   HCT 41.5 01/17/2017   PLT 237 01/17/2017   GLUCOSE 96 01/17/2017   CHOL 182 01/17/2017   TRIG 400 (H) 01/17/2017   HDL 25 (L) 01/17/2017   LDLDIRECT 124.0 07/16/2016   LDLCALC 77 01/17/2017   ALT 25 01/17/2017   AST 19 01/17/2017   NA 137 01/17/2017   K 3.7 01/17/2017   CL 100 01/17/2017   CREATININE 1.00 01/17/2017   BUN 14 01/17/2017   CO2 27 01/17/2017   TSH 3.22 01/17/2017    Lab Results  Component Value Date   TSH 3.22 01/17/2017   Lab Results  Component Value Date    WBC 9.3 01/17/2017   HGB 14.1 01/17/2017   HCT 41.5 01/17/2017   MCV 86.5 01/17/2017   PLT 237 01/17/2017   Lab Results  Component Value Date   NA 137 01/17/2017   K 3.7 01/17/2017   CO2 27 01/17/2017   GLUCOSE 96 01/17/2017   BUN 14 01/17/2017   CREATININE 1.00 01/17/2017   BILITOT 0.3 01/17/2017   ALKPHOS 73 01/17/2017  AST 19 01/17/2017   ALT 25 01/17/2017   PROT 7.5 01/17/2017   ALBUMIN 4.5 01/17/2017   CALCIUM 9.7 01/17/2017   GFR 94.83 07/16/2016   Lab Results  Component Value Date   CHOL 182 01/17/2017   Lab Results  Component Value Date   HDL 25 (L) 01/17/2017   Lab Results  Component Value Date   LDLCALC 77 01/17/2017   Lab Results  Component Value Date   TRIG 400 (H) 01/17/2017   Lab Results  Component Value Date   CHOLHDL 7.3 (H) 01/17/2017   No results found for: HGBA1C     Assessment & Plan:   Problem List Items Addressed This Visit    History of tobacco abuse    Continues to abstain and it has now been over a year.       Relevant Medications   HYDROcodone-acetaminophen (NORCO) 10-325 MG tablet   Neck pain on left side    Uses Naproxen an dNorco prn. No new concerns. May add Tylenol bid       Hyperlipidemia    Encouraged heart healthy diet, increase exercise, avoid trans fats, consider a krill oil cap daily      Hypogonadism in male    Tolerating Androgel but his insurance is making it cost prohibitive. He will check with his pharmacist to see if he has any cheaper alternatives on his formulary and he will let us know. His testosterone level is wnl on supplementation       Other Visit Diagnoses    Elevated blood pressure reading    -  Primary   Relevant Orders   Comprehensive metabolic panel (Completed)   TSH (Completed)   CBC (Completed)   Tobacco abuse       Relevant Medications   HYDROcodone-acetaminophen (NORCO) 10-325 MG tablet   Hyperlipidemia, mixed       Relevant Medications   HYDROcodone-acetaminophen (NORCO) 10-325  MG tablet   Other Relevant Orders   Lipid panel (Completed)   Low testosterone       Relevant Orders   Testosterone (Completed)      I am having Mr. Lewter maintain his cyclobenzaprine, naproxen, ALPRAZolam, testosterone, and HYDROcodone-acetaminophen.  Meds ordered this encounter  Medications  . HYDROcodone-acetaminophen (NORCO) 10-325 MG tablet    Sig: Take 1 tablet by mouth every 6 (six) hours as needed for moderate pain or severe pain.    Dispense:  120 tablet    Refill:  0    CMA served as scribe during this visit. History, Physical and Plan performed by medical provider. Documentation and orders reviewed and attested to.  Penni Homans, MD

## 2017-01-17 NOTE — Progress Notes (Signed)
Pre visit review using our clinic review tool, if applicable. No additional management support is needed unless otherwise documented below in the visit note. 

## 2017-01-17 NOTE — Patient Instructions (Addendum)
[place],  Aspercreme or Icy Hot lidocaine gel or patches Curcumen capsules daily Tylenol ES 500 mg add to hydrocodone dose twice daily (Tylenol/APAP/Acetaminophen) max of 3000 mg in 24 hours.  Glucosamine Chondrotin (Cosamin DS) commit to a 3 month trial if no better then can stop if it helps continues.   Probiotics NOW, Vitamin C 500 to 1000 mg, zinc such as Coldeeze, Aged or Black garlic and elderberry liquid  Back Pain, Adult Back pain is very common in adults.The cause of back pain is rarely dangerous and the pain often gets better over time.The cause of your back pain may not be known. Some common causes of back pain include:  Strain of the muscles or ligaments supporting the spine.  Wear and tear (degeneration) of the spinal disks.  Arthritis.  Direct injury to the back. For many people, back pain may return. Since back pain is rarely dangerous, most people can learn to manage this condition on their own. Follow these instructions at home: Watch your back pain for any changes. The following actions may help to lessen any discomfort you are feeling:  Remain active. It is stressful on your back to sit or stand in one place for long periods of time. Do not sit, drive, or stand in one place for more than 30 minutes at a time. Take short walks on even surfaces as soon as you are able.Try to increase the length of time you walk each day.  Exercise regularly as directed by your health care provider. Exercise helps your back heal faster. It also helps avoid future injury by keeping your muscles strong and flexible.  Do not stay in bed.Resting more than 1-2 days can delay your recovery.  Pay attention to your body when you bend and lift. The most comfortable positions are those that put less stress on your recovering back. Always use proper lifting techniques, including:  Bending your knees.  Keeping the load close to your body.  Avoiding twisting.  Find a comfortable position  to sleep. Use a firm mattress and lie on your side with your knees slightly bent. If you lie on your back, put a pillow under your knees.  Avoid feeling anxious or stressed.Stress increases muscle tension and can worsen back pain.It is important to recognize when you are anxious or stressed and learn ways to manage it, such as with exercise.  Take medicines only as directed by your health care provider. Over-the-counter medicines to reduce pain and inflammation are often the most helpful.Your health care provider may prescribe muscle relaxant drugs.These medicines help dull your pain so you can more quickly return to your normal activities and healthy exercise.  Apply ice to the injured area:  Put ice in a plastic bag.  Place a towel between your skin and the bag.  Leave the ice on for 20 minutes, 2-3 times a day for the first 2-3 days. After that, ice and heat may be alternated to reduce pain and spasms.  Maintain a healthy weight. Excess weight puts extra stress on your back and makes it difficult to maintain good posture. Contact a health care provider if:  You have pain that is not relieved with rest or medicine.  You have increasing pain going down into the legs or buttocks.  You have pain that does not improve in one week.  You have night pain.  You lose weight.  You have a fever or chills. Get help right away if:  You develop new bowel or  bladder control problems.  You have unusual weakness or numbness in your arms or legs.  You develop nausea or vomiting.  You develop abdominal pain.  You feel faint. This information is not intended to replace advice given to you by your health care provider. Make sure you discuss any questions you have with your health care provider. Document Released: 11/18/2005 Document Revised: 03/28/2016 Document Reviewed: 03/22/2014 Elsevier Interactive Patient Education  2017 [employer].

## 2017-01-18 LAB — TESTOSTERONE: TESTOSTERONE: 302 ng/dL (ref 250–827)

## 2017-01-20 ENCOUNTER — Other Ambulatory Visit: Payer: Self-pay | Admitting: Family Medicine

## 2017-01-20 MED ORDER — ATORVASTATIN CALCIUM 10 MG PO TABS: 10.0000 mg | ORAL_TABLET | Freq: Every day | ORAL | 1 refills | Status: DC

## 2017-01-20 NOTE — Assessment & Plan Note (Signed)
Continues to abstain and it has now been over a year.

## 2017-01-20 NOTE — Assessment & Plan Note (Signed)
Encouraged heart healthy diet, increase exercise, avoid trans fats, consider a krill oil cap daily 

## 2017-01-20 NOTE — Assessment & Plan Note (Signed)
Uses Naproxen an dNorco prn. No new concerns. May add Tylenol bid

## 2017-01-20 NOTE — Assessment & Plan Note (Signed)
Tolerating Androgel but his insurance is making it cost prohibitive. He will check with his pharmacist to see if he has any cheaper alternatives on his formulary and he will let us know. His testosterone level is wnl on supplementation

## 2017-01-21 ENCOUNTER — Encounter: Payer: Self-pay | Admitting: Family Medicine

## 2017-01-21 DIAGNOSIS — Z79891 Long term (current) use of opiate analgesic: Secondary | ICD-10-CM | POA: Diagnosis not present

## 2017-01-21 DIAGNOSIS — Z79899 Other long term (current) drug therapy: Secondary | ICD-10-CM | POA: Diagnosis not present

## 2017-01-31 MED FILL — ATORVASTATIN 10 MG TABLET: 10 | 30 days supply | Qty: 30 | Fill #0

## 2017-02-13 ENCOUNTER — Telehealth: Payer: Self-pay | Admitting: Family Medicine

## 2017-02-13 DIAGNOSIS — Z720 Tobacco use: Secondary | ICD-10-CM

## 2017-02-13 DIAGNOSIS — E782 Mixed hyperlipidemia: Secondary | ICD-10-CM

## 2017-02-13 NOTE — Telephone Encounter (Signed)
Caller name: Relationship to patient: Self Can be reached: [phone]  Pharmacy:  [place], [place] - [address] [phone] (Phone) [phone] (Fax)     Reason for call: Refill testosterone (ANDROGEL) 50 MG/5GM (1%) GEL [[id number]]  HYDROcodone-acetaminophen (NORCO) 10-325 MG tablet [[id number]   Also patient states he can not take Lipitor because it is causing him leg pain. Request to have another medication.

## 2017-02-13 NOTE — Telephone Encounter (Signed)
Requesting:    Hydrocodone and testosterone Contract   Signed on 01/17/2017 UDS  Given on 01/17/2017 Last OV    01/17/2017----next scheduled for 07/29/2017 Last Refill   #120 hydrocodone   Done on 01/17/2017                     Testosterone done on 12/17/2016  Please Advise

## 2017-02-13 NOTE — Telephone Encounter (Signed)
Ok to refill both meds 

## 2017-02-14 ENCOUNTER — Telehealth: Payer: Self-pay | Admitting: Family Medicine

## 2017-02-14 MED ORDER — HYDROCODONE-ACETAMINOPHEN 10-325 MG PO TABS: 1.0000 | ORAL_TABLET | Freq: Four times a day (QID) | ORAL | 0 refills | Status: DC | PRN

## 2017-02-14 MED ORDER — TESTOSTERONE 50 MG/5GM (1%) TD GEL: TRANSDERMAL | 0 refills | Status: DC

## 2017-02-14 MED ORDER — ROSUVASTATIN CALCIUM 5 MG PO TABS
5.0000 mg | ORAL_TABLET | Freq: Every day | ORAL | 3 refills | Status: DC
Start: 2017-02-14 — End: 2017-06-19

## 2017-02-14 MED FILL — TESTOSTERONE 12.5 MG/1.25 G: 12.5 MG/ACT | 15 days supply | Qty: 150 | Fill #0

## 2017-02-14 MED FILL — ROSUVASTATIN CALCIUM 5 MG T: 5 | 30 days supply | Qty: 30 | Fill #0

## 2017-02-14 MED FILL — HYDROCODON-APAP 10-325: 10-325 | 30 days supply | Qty: 120 | Fill #0

## 2017-02-14 NOTE — Telephone Encounter (Signed)
Patient called in this am to state the Atorvastatin is causing pain to the back of his legs and has stopped and would like an alternative sent in.

## 2017-02-14 NOTE — Telephone Encounter (Signed)
Updated medication list/allergy list. Sent in to [facility] new medication Called the patient left a detailed message of change.

## 2017-02-14 NOTE — Addendum Note (Signed)
Addended by: Sharon Seller B on: 02/14/2017 01:27 PM   Modules accepted: Orders

## 2017-02-14 NOTE — Telephone Encounter (Signed)
Printed both prescriptions Patient informed to pickup hardcopy of both at the front desk.

## 2017-02-14 NOTE — Telephone Encounter (Signed)
Have him take rosuvastatin 5 mg tab, 1 tab po qhs, disp #30 with 3 rf and put on script that he failed atorvastatin

## 2017-03-14 ENCOUNTER — Telehealth: Payer: Self-pay | Admitting: Family Medicine

## 2017-03-14 DIAGNOSIS — E782 Mixed hyperlipidemia: Secondary | ICD-10-CM

## 2017-03-14 DIAGNOSIS — Z720 Tobacco use: Secondary | ICD-10-CM

## 2017-03-14 MED ORDER — HYDROCODONE-ACETAMINOPHEN 10-325 MG PO TABS: 1.0000 | ORAL_TABLET | Freq: Four times a day (QID) | ORAL | 0 refills | Status: DC | PRN

## 2017-03-14 MED FILL — HYDROCODON-APAP 10-325: 10-325 | 30 days supply | Qty: 120 | Fill #0

## 2017-03-14 NOTE — Telephone Encounter (Signed)
Printed hardcopy/pcp signed/put at the front for pickup Called the patient informed to pickup hardcopy at the front desk.

## 2017-03-14 NOTE — Telephone Encounter (Signed)
Caller name: Relationship to patient: Self Can be reached: [phone]  Pharmacy:  Reason for call: Refill HYDROcodone-acetaminophen (NORCO) 10-325 MG tablet [[id number]

## 2017-03-14 NOTE — Telephone Encounter (Signed)
OK to refill med

## 2017-03-14 NOTE — Telephone Encounter (Signed)
Requesting:  Hydrocodone Contract    12/17/2016 UDS   Low risk on 09/20/2016 Last OV   01/17/2017----future appt 07/29/2017 Last Refill    #120 no refills on 02/14/2017  Please Advise

## 2017-04-11 ENCOUNTER — Other Ambulatory Visit: Payer: Self-pay | Admitting: Family Medicine

## 2017-04-11 DIAGNOSIS — E782 Mixed hyperlipidemia: Secondary | ICD-10-CM

## 2017-04-11 DIAGNOSIS — Z720 Tobacco use: Secondary | ICD-10-CM

## 2017-04-11 MED ORDER — HYDROCODONE-ACETAMINOPHEN 10-325 MG PO TABS: 1.0000 | ORAL_TABLET | Freq: Four times a day (QID) | ORAL | 0 refills | Status: DC | PRN

## 2017-04-11 MED ORDER — TESTOSTERONE 50 MG/5GM (1%) TD GEL: TRANSDERMAL | 0 refills | Status: DC

## 2017-04-11 MED FILL — HYDROCODON-APAP 10-325: 10-325 | 30 days supply | Qty: 120 | Fill #0

## 2017-04-11 MED FILL — TESTOSTERONE 12.5 MG/1.25 G: 12.5 MG/ACT | 15 days supply | Qty: 150 | Fill #1

## 2017-04-11 NOTE — Telephone Encounter (Signed)
Self  Refill request for HYDROcodone-acetaminophen and testosterone    Pharmacy: [place], [place]: [phone]

## 2017-04-11 NOTE — Telephone Encounter (Signed)
Faxed hardcopy for Testosterone to [facility]

## 2017-04-11 NOTE — Telephone Encounter (Signed)
Printed/Provider covering for PCP signed. Called the patient to inform to pickup at his convenience  PATIENT ALSO REQUESTING TESTOSTERONE--MY MISTAKE DID NOT SEE THAT REQUEST

## 2017-04-11 NOTE — Telephone Encounter (Signed)
Requesting:   hydrocodone Contract     01/17/2017 UDS    Low risk next is due on 07/17/2017 Last OV     01/17/2017----future appt is on 07/29/2017 Last Refill     #120 no refills on 03/14/2017  Please Advise

## 2017-04-11 NOTE — Telephone Encounter (Signed)
OK 

## 2017-05-12 ENCOUNTER — Telehealth: Payer: Self-pay | Admitting: Family Medicine

## 2017-05-12 DIAGNOSIS — Z720 Tobacco use: Secondary | ICD-10-CM

## 2017-05-12 DIAGNOSIS — E782 Mixed hyperlipidemia: Secondary | ICD-10-CM

## 2017-05-12 NOTE — Telephone Encounter (Signed)
Ok to refill needs uds

## 2017-05-12 NOTE — Telephone Encounter (Signed)
Requesting:   hydrocodone Contract    12/17/16 UDS    Low risk--- next is due on 09/20/16 Last OV   01/17/17 Last Refill    #120 no refill son 04/11/17  Please Advise----[facility]

## 2017-05-12 NOTE — Telephone Encounter (Signed)
Pt req refill hydrocodone. Pt uses [facility] op [facility].

## 2017-05-13 MED ORDER — HYDROCODONE-ACETAMINOPHEN 10-325 MG PO TABS: 1.0000 | ORAL_TABLET | Freq: Four times a day (QID) | ORAL | 0 refills | Status: DC | PRN

## 2017-05-13 MED FILL — HYDROCODON-APAP 10-325: 10-325 | 30 days supply | Qty: 120 | Fill #0

## 2017-05-13 NOTE — Telephone Encounter (Signed)
Printed/PCP signed and patient aware to pickup/update UDS

## 2017-05-15 ENCOUNTER — Encounter: Payer: Self-pay | Admitting: Family Medicine

## 2017-05-15 DIAGNOSIS — Z79891 Long term (current) use of opiate analgesic: Secondary | ICD-10-CM | POA: Diagnosis not present

## 2017-05-30 MED FILL — TESTOSTERONE 12.5 MG/1.25 G: 12.5 MG/ACT | 15 days supply | Qty: 150 | Fill #0

## 2017-05-30 MED FILL — ROSUVASTATIN CALCIUM 5 MG T: 5 | 30 days supply | Qty: 30 | Fill #1

## 2017-05-31 ENCOUNTER — Encounter: Payer: Self-pay | Admitting: Cardiology

## 2017-06-12 ENCOUNTER — Telehealth: Payer: Self-pay | Admitting: Family Medicine

## 2017-06-12 DIAGNOSIS — Z720 Tobacco use: Secondary | ICD-10-CM

## 2017-06-12 DIAGNOSIS — E782 Mixed hyperlipidemia: Secondary | ICD-10-CM

## 2017-06-12 NOTE — Telephone Encounter (Signed)
Ok to refill his hydrocodone if papereok

## 2017-06-12 NOTE — Telephone Encounter (Signed)
Relation to KF:EXMD Call back number:[phone]  Reason for call:  Patient requesting a refill HYDROcodone-acetaminophen (NORCO) 10-325 MG tablet

## 2017-06-12 NOTE — Telephone Encounter (Addendum)
Requesting: Norco Contract:yes 12/17/16  UDS: 05/15/17 Last OV: 12/17/16 Next OV: 06/17/17 Last Refill: 05/13/17  Please Advise

## 2017-06-13 MED ORDER — HYDROCODONE-ACETAMINOPHEN 10-325 MG PO TABS: 1.0000 | ORAL_TABLET | Freq: Four times a day (QID) | ORAL | 0 refills | Status: DC | PRN

## 2017-06-13 MED FILL — HYDROCODON-APAP 10-325: 10-325 | 30 days supply | Qty: 120 | Fill #0

## 2017-06-13 NOTE — Telephone Encounter (Signed)
Printed/PCP signed //patient notified to pickup at convenience

## 2017-06-17 ENCOUNTER — Ambulatory Visit (HOSPITAL_BASED_OUTPATIENT_CLINIC_OR_DEPARTMENT_OTHER)
Admission: RE | Admit: 2017-06-17 | Discharge: 2017-06-17 | Disposition: A | Discharge status: Home / Self Care | Payer: BLUE CROSS/BLUE SHIELD | Source: Ambulatory Visit | Attending: Family Medicine | Admitting: Family Medicine

## 2017-06-17 ENCOUNTER — Ambulatory Visit (INDEPENDENT_AMBULATORY_CARE_PROVIDER_SITE_OTHER): Payer: BLUE CROSS/BLUE SHIELD | Admitting: Family Medicine

## 2017-06-17 VITALS — BP 124/72 | HR 69 | Temp 98.2°F | Resp 18 | Wt 183.2 lb

## 2017-06-17 DIAGNOSIS — Z87891 Personal history of nicotine dependence: Secondary | ICD-10-CM | POA: Insufficient documentation

## 2017-06-17 DIAGNOSIS — R0602 Shortness of breath: Secondary | ICD-10-CM | POA: Diagnosis not present

## 2017-06-17 DIAGNOSIS — E291 Testicular hypofunction: Secondary | ICD-10-CM | POA: Diagnosis not present

## 2017-06-17 DIAGNOSIS — E785 Hyperlipidemia, unspecified: Secondary | ICD-10-CM

## 2017-06-17 DIAGNOSIS — F329 Major depressive disorder, single episode, unspecified: Secondary | ICD-10-CM | POA: Diagnosis not present

## 2017-06-17 DIAGNOSIS — R918 Other nonspecific abnormal finding of lung field: Secondary | ICD-10-CM | POA: Insufficient documentation

## 2017-06-17 DIAGNOSIS — R0789 Other chest pain: Secondary | ICD-10-CM | POA: Diagnosis not present

## 2017-06-17 DIAGNOSIS — F419 Anxiety disorder, unspecified: Secondary | ICD-10-CM

## 2017-06-17 DIAGNOSIS — R252 Cramp and spasm: Secondary | ICD-10-CM

## 2017-06-17 DIAGNOSIS — R079 Chest pain, unspecified: Secondary | ICD-10-CM | POA: Diagnosis not present

## 2017-06-17 DIAGNOSIS — F32A Depression, unspecified: Secondary | ICD-10-CM

## 2017-06-17 MED ORDER — ALPRAZOLAM 1 MG PO TABS: 1.0000 mg | ORAL_TABLET | Freq: Two times a day (BID) | ORAL | 2 refills | Status: DC | PRN

## 2017-06-17 MED ORDER — NITROGLYCERIN 0.4 MG SL SUBL: 0.4000 mg | SUBLINGUAL_TABLET | SUBLINGUAL | 3 refills | Status: DC | PRN

## 2017-06-17 MED ORDER — ASPIRIN 81 MG PO TABS: 81.0000 mg | ORAL_TABLET | Freq: Every day | ORAL | Status: DC

## 2017-06-17 MED FILL — ALPRAZolam 1 MG TABS: 1 | 30 days supply | Qty: 60 | Fill #0

## 2017-06-17 MED FILL — NITROGLYCERIN 0.4 MG TAB SL: 0.4 | 7 days supply | Qty: 25 | Fill #0

## 2017-06-17 NOTE — Patient Instructions (Addendum)
Call if hoarseness does not resolve in next month or worsens call for referral  64 oz of clear fluids daily, extra water for every cup of caffeine Cholesterol Cholesterol is a white, waxy, fat-like substance that is needed by the human body in small amounts. The liver makes all the cholesterol we need. Cholesterol is carried from the liver by the blood through the blood vessels. Deposits of cholesterol (plaques) may build up on blood vessel (artery) walls. Plaques make the arteries narrower and stiffer. Cholesterol plaques increase the risk for heart attack and stroke. You cannot feel your cholesterol level even if it is very high. The only way to know that it is high is to have a blood test. Once you know your cholesterol levels, you should keep a record of the test results. Work with your health care provider to keep your levels in the desired range. What do the results mean?  Total cholesterol is a rough measure of all the cholesterol in your blood.  LDL (low-density lipoprotein) is the "bad" cholesterol. This is the type that causes plaque to build up on the artery walls. You want this level to be low.  HDL (high-density lipoprotein) is the "good" cholesterol because it cleans the arteries and carries the LDL away. You want this level to be high.  Triglycerides are fat that the body can either burn for energy or store. High levels are closely linked to heart disease. What are the desired levels of cholesterol?  Total cholesterol below 200.  LDL below 100 for people who are at risk, below 70 for people at very high risk.  HDL above 40 is good. A level of 60 or higher is considered to be protective against heart disease.  Triglycerides below 150. How can I lower my cholesterol? Diet Follow your diet program as told by your health care provider.  Choose fish or white meat chicken and [place], roasted or baked. Limit fatty cuts of red meat, fried foods, and processed meats, such as sausage  and lunch meats.  Eat lots of fresh fruits and vegetables.  Choose whole grains, beans, pasta, potatoes, and cereals.  Choose olive oil, corn oil, or canola oil, and use only small amounts.  Avoid butter, mayonnaise, shortening, or palm kernel oils.  Avoid foods with trans fats.  Drink skim or nonfat milk and eat low-fat or nonfat yogurt and cheeses. Avoid whole milk, cream, ice cream, egg yolks, and full-fat cheeses.  Healthier desserts include angel food cake, ginger snaps, animal crackers, hard candy, popsicles, and low-fat or nonfat frozen yogurt. Avoid pastries, cakes, pies, and cookies.  Exercise  Follow your exercise program as told by your health care provider. A regular program: ? Helps to decrease LDL and raise HDL. ? Helps with weight control.  Do things that increase your activity level, such as gardening, walking, and taking the stairs.  Ask your health care provider about ways that you can be more active in your daily life.  Medicine  Take over-the-counter and prescription medicines only as told by your health care provider. ? Medicine may be prescribed by your health care provider to help lower cholesterol and decrease the risk for heart disease. This is usually done if diet and exercise have failed to bring down cholesterol levels. ? If you have several risk factors, you may need medicine even if your levels are normal.  This information is not intended to replace advice given to you by your health care provider. Make sure you discuss  any questions you have with your health care provider. Document Released: 08/13/2001 Document Revised: 06/15/2016 Document Reviewed: 05/18/2016 Elsevier Interactive Patient Education  2017 [employer].

## 2017-06-17 NOTE — Assessment & Plan Note (Signed)
Quit two years ago next month

## 2017-06-17 NOTE — Progress Notes (Signed)
Subjective:  I acted as a Education administrator for Dr. Charlett Blake. [place], [place]  Patient ID: Elijah Marquez, male    DOB: 1971-07-05, 46 y.o.   MRN: [id number]  No chief complaint on file.   HPI  Patient is in today for an acute visit for extreme exhausting, chest pressure off and on for past 1.5 months. He reports maybe a total of 10 episodes each lasting about 10 seconds. No associated symptoms during episodes. He notes he is active with physical activity when the episodes occur. He struggles with daily neck, left shoulder pain, and left arm pain secondary to an injury. He also struggles with anxiety and he wonders if his anxiety plays a role in these episodes. Denies palp/HA/congestion/fevers/GI or GU c/o. Taking meds as prescribed  Patient Care Team: [name], MD as PCP - General (Family Medicine) Malva Cogan, MD as Referring Physician (Orthopedic Surgery) Leeroy Cha, MD as Consulting Physician (Neurosurgery)   Past Medical History:  Diagnosis Date  . Anxiety 08/23/2012  . Anxiety as acute reaction to exceptional stress 09/20/2012  . Back pain 07/29/2013  . Chicken pox as a child  . Fatigue   . History of tobacco abuse    Failed Chantix and wellbutrin   . Hyperlipidemia   . Hyperlipidemia 08/23/2012  . Hypogonadism in male 10/02/2014  . Intermittent claudication (HCC) 08/13/2015   B/l legs  . Neck pain on left side 08/23/2012  . Osteoarthritis of both knees   . Osteoarthritis of left knee    and right knee  . Tobacco abuse     Past Surgical History:  Procedure Laterality Date  . KNEE SURGERY     right knee 2012, left knee 2011 (arthroscopy)  . NECK SURGERY  12/08/12  . WISDOM TOOTH EXTRACTION      Family History  Problem Relation Age of Onset  . Fibromyalgia Mother   . Coronary artery disease Mother   . Hypertension Mother   . Heart attack Mother   . Heart disease Mother        CAD  . Peripheral vascular disease Mother 40       hemorrhage from vascular  procedure  . Fibromyalgia Sister   . Hypertension Sister   . Heart disease Sister   . Other Son        mildly mentally handicap  . Seizures Son   . Lupus Sister   . Emphysema Sister        smoker  . Kidney disease Daughter   . Alcohol abuse Father     Social History   Social History  . Marital status: Married    Spouse name: N/A  . Number of children: N/A  . Years of education: N/A   Occupational History  . Not on file.   Social History Main Topics  . Smoking status: Former Smoker    Packs/day: 1.50    Years: 25.00    Types: Cigarettes    Quit date: 07/31/2015  . Smokeless tobacco: Never Used  . Alcohol use No  . Drug use: No  . Sexual activity: Yes    Partners: Female   Other Topics Concern  . Not on file   Social History Narrative  . No narrative on file    Outpatient Medications Prior to Visit  Medication Sig Dispense Refill  . HYDROcodone-acetaminophen (NORCO) 10-325 MG tablet Take 1 tablet by mouth every 6 (six) hours as needed for moderate pain or severe pain. 120 tablet 0  .  rosuvastatin (CRESTOR) 5 MG tablet Take 1 tablet (5 mg total) by mouth daily. 30 tablet 3  . testosterone (ANDROGEL) 50 MG/5GM (1%) GEL 8 pumps daily 2 Package 0  . ALPRAZolam (XANAX) 1 MG tablet Take 1 tablet (1 mg total) by mouth 2 (two) times daily as needed for sleep or anxiety. 60 tablet 2  . cyclobenzaprine (FLEXERIL) 10 MG tablet Take 1 tablet (10 mg total) by mouth 3 (three) times daily as needed for muscle spasms. (Patient not taking: Reported on 06/17/2017) 30 tablet 5  . naproxen (NAPROSYN) 500 MG tablet TAKE 1 TABLET (500 MG) BY MOUTH 2 TIMES A DAY WITH A MEAL. (Patient not taking: Reported on 06/17/2017) 60 tablet 5   No facility-administered medications prior to visit.     Allergies  Allergen Reactions  . Atorvastatin Other (See Comments)    Caused leg pain  . Penicillins Swelling and Rash    Review of Systems  Constitutional: Negative for fever and  malaise/fatigue.  HENT: Negative for congestion.   Eyes: Negative for blurred vision.  Respiratory: Positive for shortness of breath.   Cardiovascular: Positive for chest pain. Negative for palpitations and leg swelling.  Gastrointestinal: Negative for abdominal pain, blood in stool and nausea.  Genitourinary: Negative for dysuria and frequency.  Musculoskeletal: Negative for falls.  Skin: Negative for rash.  Neurological: Negative for dizziness, loss of consciousness and headaches.  Endo/Heme/Allergies: Negative for environmental allergies.  Psychiatric/Behavioral: Positive for depression. The patient is nervous/anxious.        Objective:    Physical Exam  Constitutional: He is oriented to person, place, and time. He appears well-developed and well-nourished. No distress.  HENT:  Head: Normocephalic and atraumatic.  Nose: Nose normal.  Eyes: Right eye exhibits no discharge. Left eye exhibits no discharge.  Neck: Normal range of motion. Neck supple.  Cardiovascular: Normal rate and regular rhythm.   No murmur heard. Pulmonary/Chest: Effort normal and breath sounds normal.  Abdominal: Soft. Bowel sounds are normal. There is no tenderness.  Musculoskeletal: He exhibits no edema.  Neurological: He is alert and oriented to person, place, and time.  Skin: Skin is warm and dry.  Psychiatric: He has a normal mood and affect.  Nursing note and vitals reviewed.   BP 124/72 (BP Location: Left Arm, Patient Position: Sitting, Cuff Size: Normal)   Pulse 69   Temp 98.2 F (36.8 C) (Oral)   Resp 18   Wt 183 lb 3.2 oz (83.1 kg)   SpO2 98%   BMI 27.86 kg/m  Wt Readings from Last 3 Encounters:  06/17/17 183 lb 3.2 oz (83.1 kg)  01/17/17 187 lb 12.8 oz (85.2 kg)  07/16/16 180 lb 6.4 oz (81.8 kg)   BP Readings from Last 3 Encounters:  06/17/17 124/72  01/17/17 120/80  07/16/16 130/72      There is no immunization history on file for this patient.  Health Maintenance  Topic Date  Due  . HIV Screening  05/03/1984  . TETANUS/TDAP  05/03/1988  . INFLUENZA VACCINE  03/02/2019 (Originally 07/02/2017)    Lab Results  Component Value Date   WBC 7.3 06/17/2017   HGB 14.1 06/17/2017   HCT 41.5 06/17/2017   PLT 212.0 06/17/2017   GLUCOSE 93 06/17/2017   CHOL 135 06/17/2017   TRIG 252.0 (H) 06/17/2017   HDL 36.20 (L) 06/17/2017   LDLDIRECT 77.0 06/17/2017   LDLCALC 77 01/17/2017   ALT 26 06/17/2017   AST 21 06/17/2017   NA 137 06/17/2017  K 3.8 06/17/2017   CL 101 06/17/2017   CREATININE 1.29 06/17/2017   BUN 14 06/17/2017   CO2 30 06/17/2017   TSH 2.48 06/17/2017    Lab Results  Component Value Date   TSH 2.48 06/17/2017   Lab Results  Component Value Date   WBC 7.3 06/17/2017   HGB 14.1 06/17/2017   HCT 41.5 06/17/2017   MCV 87.8 06/17/2017   PLT 212.0 06/17/2017   Lab Results  Component Value Date   NA 137 06/17/2017   K 3.8 06/17/2017   CO2 30 06/17/2017   GLUCOSE 93 06/17/2017   BUN 14 06/17/2017   CREATININE 1.29 06/17/2017   BILITOT 0.4 06/17/2017   ALKPHOS 85 06/17/2017   AST 21 06/17/2017   ALT 26 06/17/2017   PROT 7.2 06/17/2017   ALBUMIN 4.3 06/17/2017   CALCIUM 9.4 06/17/2017   GFR 63.15 06/17/2017   Lab Results  Component Value Date   CHOL 135 06/17/2017   Lab Results  Component Value Date   HDL 36.20 (L) 06/17/2017   Lab Results  Component Value Date   LDLCALC 77 01/17/2017   Lab Results  Component Value Date   TRIG 252.0 (H) 06/17/2017   Lab Results  Component Value Date   CHOLHDL 4 06/17/2017   No results found for: HGBA1C       Assessment & Plan:   Problem List Items Addressed This Visit    History of tobacco abuse    Quit two years ago next month      Relevant Orders   CBC (Completed)   Hyperlipidemia    Tolerating statin, encouraged heart healthy diet, avoid trans fats, minimize simple carbs and saturated fats. Increase exercise as tolerated      Relevant Medications   aspirin 81 MG tablet     nitroGLYCERIN (NITROSTAT) 0.4 MG SL tablet   Other Relevant Orders   Lipid panel (Completed)   LDL cholesterol, direct (Completed)   Anxiety and depression   Relevant Medications   ALPRAZolam (XANAX) 1 MG tablet   Hypogonadism in male   Relevant Orders   Testosterone (Completed)   Chest pressure - Primary    Has been recurrent and last seconds without associated symptoms over past 1.5 months. Due to his risk factors will start ECASA 81 mg daily, given NTG to use prn and is referred to cardiology for further work up. He will avoid strenuous exercise and seek care if pain occurs and does not resolve prior to his cardiology appointment      Relevant Orders   EKG 12-Lead (Completed)   Ambulatory referral to Cardiology   Comprehensive metabolic panel (Completed)   TSH (Completed)    Other Visit Diagnoses    SOB (shortness of breath)       Relevant Orders   DG Chest 2 View (Completed)   Muscle cramp       Relevant Orders   Magnesium (Completed)      I have discontinued Mr. Wandersee's cyclobenzaprine and naproxen. I am also having him start on aspirin and nitroGLYCERIN. Additionally, I am having him maintain his rosuvastatin, testosterone, HYDROcodone-acetaminophen, and ALPRAZolam.  Meds ordered this encounter  Medications  . aspirin 81 MG tablet    Sig: Take 1 tablet (81 mg total) by mouth daily.    Dispense:  30 tablet  . nitroGLYCERIN (NITROSTAT) 0.4 MG SL tablet    Sig: Place 1 tablet (0.4 mg total) under the tongue every 5 (five) minutes as needed for chest  pain. To ER if no resolution    Dispense:  25 tablet    Refill:  3  . ALPRAZolam (XANAX) 1 MG tablet    Sig: Take 1 tablet (1 mg total) by mouth 2 (two) times daily as needed for sleep or anxiety.    Dispense:  60 tablet    Refill:  2    CMA served as scribe during this visit. History, Physical and Plan performed by medical provider. Documentation and orders reviewed and attested to.  Penni Homans, MD

## 2017-06-18 DIAGNOSIS — R0789 Other chest pain: Secondary | ICD-10-CM | POA: Insufficient documentation

## 2017-06-18 LAB — LDL CHOLESTEROL, DIRECT: Direct LDL: 77 mg/dL

## 2017-06-18 LAB — LIPID PANEL
CHOLESTEROL: 135 mg/dL (ref 0–200)
HDL: 36.2 mg/dL — ABNORMAL LOW (ref >39.00)
NonHDL: 98.9
TRIGLYCERIDES: 252 mg/dL — AB (ref 0.0–149.0)
Total CHOL/HDL Ratio: 4
VLDL: 50.4 mg/dL — ABNORMAL HIGH (ref 0.0–40.0)

## 2017-06-18 LAB — COMPREHENSIVE METABOLIC PANEL
ALBUMIN: 4.3 g/dL (ref 3.5–5.2)
ALT: 26 U/L (ref 0–53)
AST: 21 U/L (ref 0–37)
Alkaline Phosphatase: 85 U/L (ref 39–117)
BILIRUBIN TOTAL: 0.4 mg/dL (ref 0.2–1.2)
BUN: 14 mg/dL (ref 6–23)
CALCIUM: 9.4 mg/dL (ref 8.4–10.5)
CO2: 30 meq/L (ref 19–32)
CREATININE: 1.29 mg/dL (ref 0.40–1.50)
Chloride: 101 mEq/L (ref 96–112)
GFR: 63.15 mL/min (ref >60.00)
Glucose, Bld: 93 mg/dL (ref 70–99)
Potassium: 3.8 mEq/L (ref 3.5–5.1)
Sodium: 137 mEq/L (ref 135–145)
Total Protein: 7.2 g/dL (ref 6.0–8.3)

## 2017-06-18 LAB — CBC
HCT: 41.5 % (ref 39.0–52.0)
Hemoglobin: 14.1 g/dL (ref 13.0–17.0)
MCHC: 33.9 g/dL (ref 30.0–36.0)
MCV: 87.8 fl (ref 78.0–100.0)
PLATELETS: 212 10*3/uL (ref 150.0–400.0)
RBC: 4.73 Mil/uL (ref 4.22–5.81)
RDW: 12.7 % (ref 11.5–15.5)
WBC: 7.3 10*3/uL (ref 4.0–10.5)

## 2017-06-18 LAB — TSH: TSH: 2.48 u[IU]/mL (ref 0.35–4.50)

## 2017-06-18 LAB — MAGNESIUM: Magnesium: 1.9 mg/dL (ref 1.5–2.5)

## 2017-06-18 LAB — TESTOSTERONE: TESTOSTERONE: 167.46 ng/dL — AB (ref 300.00–890.00)

## 2017-06-18 NOTE — Assessment & Plan Note (Signed)
Tolerating statin, encouraged heart healthy diet, avoid trans fats, minimize simple carbs and saturated fats. Increase exercise as tolerated 

## 2017-06-18 NOTE — Assessment & Plan Note (Signed)
Has been recurrent and last seconds without associated symptoms over past 1.5 months. Due to his risk factors will start ECASA 81 mg daily, given NTG to use prn and is referred to cardiology for further work up. He will avoid strenuous exercise and seek care if pain occurs and does not resolve prior to his cardiology appointment

## 2017-06-19 ENCOUNTER — Other Ambulatory Visit: Payer: Self-pay | Admitting: Family Medicine

## 2017-06-19 ENCOUNTER — Encounter: Payer: Self-pay | Admitting: Cardiology

## 2017-06-19 ENCOUNTER — Ambulatory Visit (INDEPENDENT_AMBULATORY_CARE_PROVIDER_SITE_OTHER): Payer: BLUE CROSS/BLUE SHIELD | Admitting: Cardiology

## 2017-06-19 VITALS — BP 138/88 | HR 60 | Ht 69.0 in | Wt 183.1 lb

## 2017-06-19 DIAGNOSIS — R079 Chest pain, unspecified: Secondary | ICD-10-CM

## 2017-06-19 DIAGNOSIS — E782 Mixed hyperlipidemia: Secondary | ICD-10-CM | POA: Diagnosis not present

## 2017-06-19 DIAGNOSIS — I2089 Other forms of angina pectoris: Secondary | ICD-10-CM | POA: Insufficient documentation

## 2017-06-19 DIAGNOSIS — I208 Other forms of angina pectoris: Secondary | ICD-10-CM | POA: Insufficient documentation

## 2017-06-19 MED ORDER — ROSUVASTATIN CALCIUM 10 MG PO TABS: 10.0000 mg | ORAL_TABLET | Freq: Every day | ORAL | 6 refills | Status: DC

## 2017-06-19 MED FILL — ROSUVASTATIN CALCIUM 10 MG: 10 | 30 days supply | Qty: 30 | Fill #0

## 2017-06-19 NOTE — Patient Instructions (Addendum)
Medication Instructions:  Your physician has recommended you make the following change in your medication:  CHANGE rosuvastatin (Crestor) from 5 mg to 10 mg daily. START nitro. Take 1 tablet when having chest pain, wait 5 minutes, still having chest pain, take 1 tablet, wait 5 minutes, still having chest pain, wait 5 minutes, dial 911. Total of 3 nitro in 15 minutes   Labwork: Your physician recommends that you return for lab work in: today. STAT troponin   Testing/Procedures: Your physician has requested that you have en exercise stress myoview. For further information please visit [email or web address]. Please follow instruction sheet, as given.  You had an EKG today.   Follow-Up: Your physician recommends that you schedule a follow-up appointment in: 3 weeks.   Any Other Special Instructions Will Be Listed Below (If Applicable).     If you need a refill on your cardiac medications before your next appointment, please call your pharmacy.

## 2017-06-19 NOTE — Progress Notes (Signed)
Cardiology Office Note:    Date:  06/19/2017   ID:  Elijah Marquez, DOB 20-May-1971, MRN [id number]  PCP:  [name], MD  Cardiologist:  Shirlee More, MD   Referring MD: [name], MD  ASSESSMENT:    1. Chest pain in adult   2. Mixed hyperlipidemia    PLAN:    In order of problems listed above:  1. His pretest probability of coronary disease is intermediate and his symptoms in my opinion are concerning in the last episode sounds quite typical for unstable angina. We discussed the option of direct referral for coronary angiography or noninvasive stress test he prefers the latter. We'll check a troponin stat today if it's normally be set up for stress myocardial perfusion study and agrees if it is anything but normal to proceed with coronary angiography. In the interim he'll continue aspirin that he started along with his high intensity statin and he has a prescription for nitroglycerin I encouraged him to use it if he has another episode of significant chest discomfort. He is stop smoking and I counseled him to continue. At this time I would not start an antihypertensive drug 2. Stable continues high intensity statin  Next appointment 3 weeks  Medication Adjustments/Labs and Tests Ordered: Current medicines are reviewed at length with the patient today.  Concerns regarding medicines are outlined above.  Orders Placed This Encounter  Procedures  . Troponin I  . Myocardial Perfusion Imaging  . EKG 12-Lead   No orders of the defined types were placed in this encounter.    Chief Complaint  Patient presents with  . New Patient (Initial Visit)    per Dr Charlett Blake to evaulate CP    History of Present Illness:    Elijah Marquez is a 46 y.o. male with dyslipidemiawho is being seen today for the evaluation of chest pain at the request of [name], MD.He is a lifelong smoker having stopped 2 months ago 35 years 2 packs per day. He also has had labile elevated blood  pressure but no diagnosis of hypertension he has a family history of coronary artery disease with his mother.  For several months he's had a vague chest discomfort that he really was not too concerned about he would get a tightness radiating through both the right and left chest into the arms and would last for a few seconds it was exertional in nature and was relieved with rest and he said he learned to do activities without provoking it. 2 weeks ago he is on vacation swimming with the children and developed severe chest pressure radiating into the shoulders and arms both sides up into the neck and into his face hit pulled himself up on the side of the swimming pool to recover he went in and rested and it lasted about 5 minutes. Since then he's felt well he's had no recurrent episodes. No complaints of shortness of breath or pleuritic nature no nausea or vomiting or diaphoresis.  He's had trauma cervical spine disc disease surgery is concerned this may be on the basis of his spine. He has no history of congenital rheumatic heart disease.   Past Medical History:  Diagnosis Date  . Anxiety 08/23/2012  . Anxiety as acute reaction to exceptional stress 09/20/2012  . Back pain 07/29/2013  . Chicken pox as a child  . Fatigue   . History of tobacco abuse    Failed Chantix and wellbutrin   . Hyperlipidemia   .  Hyperlipidemia 08/23/2012  . Hypogonadism in male 10/02/2014  . Intermittent claudication (HCC) 08/13/2015   B/l legs  . Neck pain on left side 08/23/2012  . Osteoarthritis of both knees   . Osteoarthritis of left knee    and right knee  . Tobacco abuse     Past Surgical History:  Procedure Laterality Date  . KNEE SURGERY     right knee 2012, left knee 2011 (arthroscopy)  . NECK SURGERY  12/08/12  . WISDOM TOOTH EXTRACTION      Current Medications: Current Meds  Medication Sig  . ALPRAZolam (XANAX) 1 MG tablet Take 1 tablet (1 mg total) by mouth 2 (two) times daily as needed for sleep  or anxiety.  Marland Kitchen aspirin 81 MG tablet Take 1 tablet (81 mg total) by mouth daily.  Marland Kitchen HYDROcodone-acetaminophen (NORCO) 10-325 MG tablet Take 1 tablet by mouth every 6 (six) hours as needed for moderate pain or severe pain.  . nitroGLYCERIN (NITROSTAT) 0.4 MG SL tablet Place 1 tablet (0.4 mg total) under the tongue every 5 (five) minutes as needed for chest pain. To ER if no resolution  . rosuvastatin (CRESTOR) 10 MG tablet Take 1 tablet (10 mg total) by mouth daily.  Marland Kitchen testosterone (ANDROGEL) 50 MG/5GM (1%) GEL 8 pumps daily     Allergies:   Atorvastatin and Penicillins   Social History   Social History  . Marital status: Married    Spouse name: N/A  . Number of children: N/A  . Years of education: N/A   Social History Main Topics  . Smoking status: Former Smoker    Packs/day: 1.50    Years: 25.00    Types: Cigarettes    Quit date: 07/31/2015  . Smokeless tobacco: Never Used  . Alcohol use No  . Drug use: No  . Sexual activity: Yes    Partners: Female   Other Topics Concern  . None   Social History Narrative  . None     Family History: The patient's family history includes Alcohol abuse in his father; Coronary artery disease in his mother; Emphysema in his sister; Fibromyalgia in his mother and sister; Heart attack in his mother; Heart disease in his mother and sister; Hypertension in his mother and sister; Kidney disease in his daughter; Lupus in his sister; Other in his son; Peripheral vascular disease (age of onset: 51) in his mother; Seizures in his son.  ROS:   ROS Please see the history of present illness.     All other systems reviewed and are negative.  EKGs/Labs/Other Studies Reviewed:    The following studies were reviewed today: I reviewed his PCP records and previous EKG as well as laboratory tests and lipid profile prior to the visit EKG 06/17/17 independently reviewed, [facility] normal EKG:  EKG is  ordered today.  The ekg ordered today demonstrates sinus rhythm  and continues to be normal CXR 06/17/17:IMPRESSION: Mild lung hyperexpansion and chronic bronchitic change without superimposed acute cardiopulmonary disease. Recent Labs: 06/17/2017: ALT 26; BUN 14; Creatinine, Ser 1.29; Hemoglobin 14.1; Magnesium 1.9; Platelets 212.0; Potassium 3.8; Sodium 137; TSH 2.48 CBC normal Recent Lipid Panel    Component Value Date/Time   CHOL 135 06/17/2017 1738   TRIG 252.0 (H) 06/17/2017 1738   HDL 36.20 (L) 06/17/2017 1738   CHOLHDL 4 06/17/2017 1738   VLDL 50.4 (H) 06/17/2017 1738   LDLCALC 77 01/17/2017 1728   LDLDIRECT 77.0 06/17/2017 1738    Physical Exam:    VS:  BP  138/88   Pulse 60   Ht 5\' 9"  (1.753 m)   Wt 183 lb 1.9 oz (83.1 kg)   SpO2 97%   BMI 27.04 kg/m     Wt Readings from Last 3 Encounters:  06/19/17 183 lb 1.9 oz (83.1 kg)  06/17/17 183 lb 3.2 oz (83.1 kg)  01/17/17 187 lb 12.8 oz (85.2 kg)     GEN:  Well nourished, well developed in no acute distress HEENT: Normal NECK: No JVD; No carotid bruits LYMPHATICS: No lymphadenopathy CARDIAC: RRR, no murmurs, rubs, gallops RESPIRATORY:  Clear to auscultation without rales, wheezing or rhonchi  ABDOMEN: Soft, non-tender, non-distended MUSCULOSKELETAL:  No edema; No deformity  SKIN: Warm and dry NEUROLOGIC:  Alert and oriented x 3 PSYCHIATRIC:  Normal affect  His upper extremity lower extremity pulses are full his skin is normal without stasis or ischemic changes. He has no xanthoma or xanthelasma.   Signed, Shirlee More, MD  06/19/2017 4:27 PM    [place]

## 2017-06-20 ENCOUNTER — Telehealth (HOSPITAL_COMMUNITY): Payer: Self-pay | Admitting: *Deleted

## 2017-06-20 ENCOUNTER — Telehealth: Payer: Self-pay | Admitting: *Deleted

## 2017-06-20 LAB — TROPONIN I

## 2017-06-20 NOTE — Telephone Encounter (Signed)
Left message on voicemail in reference to upcoming appointment scheduled for 06/23/17. Phone number given for a call back so details instructions can be given. Elijah Marquez

## 2017-06-20 NOTE — Telephone Encounter (Signed)
F/u message  Pt returning calls. Please call back to discuss.

## 2017-06-23 ENCOUNTER — Encounter (HOSPITAL_COMMUNITY): Payer: BLUE CROSS/BLUE SHIELD

## 2017-06-24 ENCOUNTER — Telehealth (HOSPITAL_COMMUNITY): Payer: Self-pay | Admitting: *Deleted

## 2017-06-24 NOTE — Telephone Encounter (Signed)
Patient given detailed instructions per Myocardial Perfusion Study Information Sheet for the test on 06/26/17 at 7:30. Patient notified to arrive 15 minutes early and that it is imperative to arrive on time for appointment to keep from having the test rescheduled.  If you need to cancel or reschedule your appointment, please call the office within 24 hours of your appointment. . Patient verbalized understanding.Veronia Beets

## 2017-06-26 ENCOUNTER — Ambulatory Visit (HOSPITAL_COMMUNITY): Payer: BLUE CROSS/BLUE SHIELD | Attending: Cardiology

## 2017-06-26 DIAGNOSIS — R079 Chest pain, unspecified: Secondary | ICD-10-CM | POA: Diagnosis not present

## 2017-06-26 LAB — MYOCARDIAL PERFUSION IMAGING
CHL CUP MPHR: 172 {beats}/min
CHL CUP NUCLEAR SDS: 8
CSEPHR: 88 %
Estimated workload: 7 METS
Exercise duration (min): 6 min
Exercise duration (sec): 45 s
LVDIAVOL: 143 mL (ref 62–150)
LVSYSVOL: 75 mL
NUC STRESS TID: 1.09
Peak HR: 153 {beats}/min
RATE: 0.39
Rest HR: 53 {beats}/min
SRS: 3
SSS: 11

## 2017-06-26 MED ORDER — TECHNETIUM TC 99M TETROFOSMIN IV KIT
32.6000 | PACK | Freq: Once | INTRAVENOUS | Status: AC | PRN
  Administered 2017-06-26: 32.6 via INTRAVENOUS
  Filled 2017-06-26: qty 33

## 2017-06-26 MED ORDER — TECHNETIUM TC 99M TETROFOSMIN IV KIT
10.3000 | PACK | Freq: Once | INTRAVENOUS | Status: AC | PRN
  Administered 2017-06-26: 10.3 via INTRAVENOUS
  Filled 2017-06-26: qty 11

## 2017-06-27 ENCOUNTER — Encounter: Payer: Self-pay | Admitting: Cardiology

## 2017-06-27 ENCOUNTER — Ambulatory Visit (INDEPENDENT_AMBULATORY_CARE_PROVIDER_SITE_OTHER): Payer: BLUE CROSS/BLUE SHIELD | Admitting: Cardiology

## 2017-06-27 VITALS — BP 122/80 | HR 80 | Resp 10 | Ht 69.0 in | Wt 182.2 lb

## 2017-06-27 DIAGNOSIS — E782 Mixed hyperlipidemia: Secondary | ICD-10-CM | POA: Diagnosis not present

## 2017-06-27 DIAGNOSIS — I209 Angina pectoris, unspecified: Secondary | ICD-10-CM | POA: Diagnosis not present

## 2017-06-27 DIAGNOSIS — I10 Essential (primary) hypertension: Secondary | ICD-10-CM

## 2017-06-27 MED ORDER — ROSUVASTATIN CALCIUM 20 MG PO TABS: 20.0000 mg | ORAL_TABLET | Freq: Every day | ORAL | 3 refills | Status: DC

## 2017-06-27 MED ORDER — METOPROLOL TARTRATE 25 MG PO TABS: 25.0000 mg | ORAL_TABLET | Freq: Two times a day (BID) | ORAL | 3 refills | Status: DC

## 2017-06-27 MED FILL — ROSUVASTATIN CALCIUM 20 MG: 20 | 30 days supply | Qty: 30 | Fill #0

## 2017-06-27 MED FILL — METOPROLOL TARTRATE 25 MG T: 25 | 30 days supply | Qty: 60 | Fill #0

## 2017-06-27 NOTE — Patient Instructions (Signed)
Medication Instructions:  Your physician has recommended you make the following change in your medication:  CHANGE rosuvastatin (Crestor) 20 mg daily START metoprolol tartrate (Lopressor) 25 mg twice daily   Labwork: Your physician recommends that you return for lab work in: today. CMP, CBC   Testing/Procedures: You are going to have a cardiac catheterization.  A chest x-ray takes a picture of the organs and structures inside the chest, including the heart, lungs, and blood vessels. This test can show several things, including, whether the heart is enlarges; whether fluid is building up in the lungs; and whether pacemaker / defibrillator leads are still in place.   Follow-Up: Your physician recommends that you schedule a follow-up appointment in: 1 month   Any Other Special Instructions Will Be Listed Below (If Applicable).     If you need a refill on your cardiac medications before your next appointment, please call your pharmacy.

## 2017-06-27 NOTE — Progress Notes (Signed)
Cardiology Office Note:    Date:  06/27/2017   ID:  Elijah Marquez, DOB Jun 12, 1971, MRN [id number]  PCP:  [name], MD  Cardiologist:  Jenne Campus, MD    Referring MD: [name], MD   Chief Complaint  Patient presents with  . Abnormal Nuclear  I'm having exertional chest pain  History of Present Illness:    Elijah Marquez is a 46 y.o. male  essential chest pain and abnormal stress test. He is here today to discuss results of his test. His stressed  indicated presence of non-proximal LAD lesion which is hemodynamically significant Duke score -> -2 ( Moderate) . On top of that he does have fairly typical exertional angina. We discussed option for the situation option being medical therapy versus cardiac catheterization. I recommended cardiac catheterization. Procedure was explained to the pt including  all risks and benefits as well as alternatives. He agreed to proceed. We'll schedule him electively for next week. I will ask him to continue with aspirin, we'll double the dose of Crestor, he will be taking 20 mg Crestor daily, I will put him on metoprolol 25 mg twice daily, he does have nitroglycerin and knows how to take it. He was advised not to exercise until we'll do cardiac catheterization. He was advised to take nitroglycerin for chest pain and go to the emergency room if pain is not relieved by rest  Past Medical History:  Diagnosis Date  . Anxiety 08/23/2012  . Anxiety as acute reaction to exceptional stress 09/20/2012  . Back pain 07/29/2013  . Chicken pox as a child  . Fatigue   . History of tobacco abuse    Failed Chantix and wellbutrin   . Hyperlipidemia   . Hyperlipidemia 08/23/2012  . Hypogonadism in male 10/02/2014  . Intermittent claudication (HCC) 08/13/2015   B/l legs  . Neck pain on left side 08/23/2012  . Osteoarthritis of both knees   . Osteoarthritis of left knee    and right knee  . Tobacco abuse     Past Surgical History:  Procedure  Laterality Date  . KNEE SURGERY     right knee 2012, left knee 2011 (arthroscopy)  . NECK SURGERY  12/08/12  . WISDOM TOOTH EXTRACTION      Current Medications: Current Meds  Medication Sig  . ALPRAZolam (XANAX) 1 MG tablet Take 1 tablet (1 mg total) by mouth 2 (two) times daily as needed for sleep or anxiety.  Marland Kitchen aspirin 81 MG tablet Take 1 tablet (81 mg total) by mouth daily.  Marland Kitchen HYDROcodone-acetaminophen (NORCO) 10-325 MG tablet Take 1 tablet by mouth every 6 (six) hours as needed for moderate pain or severe pain.  . nitroGLYCERIN (NITROSTAT) 0.4 MG SL tablet Place 1 tablet (0.4 mg total) under the tongue every 5 (five) minutes as needed for chest pain. To ER if no resolution  . rosuvastatin (CRESTOR) 10 MG tablet Take 1 tablet (10 mg total) by mouth daily.  Marland Kitchen testosterone (ANDROGEL) 50 MG/5GM (1%) GEL 8 pumps daily     Allergies:   Atorvastatin and Penicillins   Social History   Social History  . Marital status: Married    Spouse name: N/A  . Number of children: N/A  . Years of education: N/A   Social History Main Topics  . Smoking status: Former Smoker    Packs/day: 1.50    Years: 25.00    Types: Cigarettes    Quit date: 07/31/2015  . Smokeless tobacco: Never Used  .  Alcohol use No  . Drug use: No  . Sexual activity: Yes    Partners: Female   Other Topics Concern  . None   Social History Narrative  . None     Family History: The patient's family history includes Alcohol abuse in his father; Coronary artery disease in his mother; Emphysema in his sister; Fibromyalgia in his mother and sister; Heart attack in his mother; Heart disease in his mother and sister; Hypertension in his mother and sister; Kidney disease in his daughter; Lupus in his sister; Other in his son; Peripheral vascular disease (age of onset: 71) in his mother; Seizures in his son. ROS:   Please see the history of present illness.    All 14 point review of systems negative except as described per  history of present illness  EKGs/Labs/Other Studies Reviewed:      Recent Labs: 06/17/2017: ALT 26; BUN 14; Creatinine, Ser 1.29; Hemoglobin 14.1; Magnesium 1.9; Platelets 212.0; Potassium 3.8; Sodium 137; TSH 2.48  Recent Lipid Panel    Component Value Date/Time   CHOL 135 06/17/2017 1738   TRIG 252.0 (H) 06/17/2017 1738   HDL 36.20 (L) 06/17/2017 1738   CHOLHDL 4 06/17/2017 1738   VLDL 50.4 (H) 06/17/2017 1738   LDLCALC 77 01/17/2017 1728   LDLDIRECT 77.0 06/17/2017 1738    Physical Exam:    VS:  BP 122/80   Pulse 80   Resp 10   Ht 5\' 9"  (1.753 m)   Wt 182 lb 3.2 oz (82.6 kg)   BMI 26.91 kg/m     Wt Readings from Last 3 Encounters:  06/27/17 182 lb 3.2 oz (82.6 kg)  06/26/17 183 lb (83 kg)  06/19/17 183 lb 1.9 oz (83.1 kg)     GEN:  Well nourished, well developed in no acute distress HEENT: Normal NECK: No JVD; No carotid bruits LYMPHATICS: No lymphadenopathy CARDIAC: RRR, no murmurs, no rubs, no gallops RESPIRATORY:  Clear to auscultation without rales, wheezing or rhonchi  ABDOMEN: Soft, non-tender, non-distended MUSCULOSKELETAL:  No edema; No deformity  SKIN: Warm and dry LOWER EXTREMITIES: no swelling NEUROLOGIC:  Alert and oriented x 3 PSYCHIATRIC:  Normal affect   ASSESSMENT:    1. Mixed hyperlipidemia   2. Essential hypertension   3. Angina pectoris ([place])    PLAN:    In order of problems listed above:  1. Angina pectoris in this gentleman with abnormal stress test. We'll proceed with cardiac catheterization. Plan and discussion       above. 2. Essential hypertension: Her medications which I will continue I will add metoprolol to his medical regimen. 3. Dyslipidemia: I will increase Crestor.   Medication Adjustments/Labs and Tests Ordered: Current medicines are reviewed at length with the patient today.  Concerns regarding medicines are outlined above.  No orders of the defined types were placed in this encounter.  Medication changes: No  orders of the defined types were placed in this encounter.   Signed, Park Liter, MD, [facility] 06/27/2017 3:56 PM    [place]

## 2017-06-28 LAB — COMPREHENSIVE METABOLIC PANEL
A/G RATIO: 1.8 (ref 1.2–2.2)
ALK PHOS: 93 IU/L (ref 39–117)
ALT: 34 IU/L (ref 0–44)
AST: 26 IU/L (ref 0–40)
Albumin: 4.7 g/dL (ref 3.5–5.5)
BILIRUBIN TOTAL: 0.4 mg/dL (ref 0.0–1.2)
BUN/Creatinine Ratio: 13 (ref 9–20)
BUN: 11 mg/dL (ref 6–24)
CHLORIDE: 99 mmol/L (ref 96–106)
CO2: 24 mmol/L (ref 20–29)
Calcium: 10.2 mg/dL (ref 8.7–10.2)
Creatinine, Ser: 0.88 mg/dL (ref 0.76–1.27)
GFR calc non Af Amer: 102 mL/min/{1.73_m2} (ref >59)
GFR, EST AFRICAN AMERICAN: 117 mL/min/{1.73_m2} (ref >59)
Globulin, Total: 2.6 g/dL (ref 1.5–4.5)
Glucose: 87 mg/dL (ref 65–99)
POTASSIUM: 3.8 mmol/L (ref 3.5–5.2)
Sodium: 140 mmol/L (ref 134–144)
Total Protein: 7.3 g/dL (ref 6.0–8.5)

## 2017-06-28 LAB — CBC WITH DIFFERENTIAL/PLATELET
BASOS ABS: 0 10*3/uL (ref 0.0–0.2)
Basos: 0 %
EOS (ABSOLUTE): 0.2 10*3/uL (ref 0.0–0.4)
Eos: 2 %
Hematocrit: 42.2 % (ref 37.5–51.0)
Hemoglobin: 14.5 g/dL (ref 13.0–17.7)
IMMATURE GRANS (ABS): 0 10*3/uL (ref 0.0–0.1)
Immature Granulocytes: 0 %
LYMPHS: 37 %
Lymphocytes Absolute: 3.4 10*3/uL — ABNORMAL HIGH (ref 0.7–3.1)
MCH: 29.5 pg (ref 26.6–33.0)
MCHC: 34.4 g/dL (ref 31.5–35.7)
MCV: 86 fL (ref 79–97)
Monocytes Absolute: 0.6 10*3/uL (ref 0.1–0.9)
Monocytes: 6 %
NEUTROS ABS: 4.8 10*3/uL (ref 1.4–7.0)
NEUTROS PCT: 55 %
PLATELETS: 202 10*3/uL (ref 150–379)
RBC: 4.92 x10E6/uL (ref 4.14–5.80)
RDW: 13.3 % (ref 12.3–15.4)
WBC: 9 10*3/uL (ref 3.4–10.8)

## 2017-06-30 ENCOUNTER — Telehealth: Payer: Self-pay

## 2017-06-30 NOTE — Telephone Encounter (Signed)
Patient contacted pre-catheterization at [facility] scheduled for:  07/02/2017 @ 1300 Verified arrival time and place:  NT @ 1100 Confirmed AM meds to be taken pre-cath with sip of water:  Notified Pt to take ASA prior to arrival.  Notified it was ok to take the rest of his am meds. Confirmed patient has responsible person to drive home post procedure and observe patient for 24 hours:  yes

## 2017-07-02 ENCOUNTER — Ambulatory Visit (HOSPITAL_COMMUNITY)
Admission: RE | Admit: 2017-07-02 | Discharge: 2017-07-03 | Disposition: A | Discharge status: Home / Self Care | Payer: BLUE CROSS/BLUE SHIELD | Source: Ambulatory Visit | Attending: Cardiovascular Disease | Admitting: Cardiovascular Disease

## 2017-07-02 ENCOUNTER — Encounter (HOSPITAL_COMMUNITY)
Admission: RE | Disposition: A | Discharge status: Home / Self Care | Payer: Self-pay | Source: Ambulatory Visit | Attending: Cardiovascular Disease

## 2017-07-02 ENCOUNTER — Other Ambulatory Visit: Payer: Self-pay

## 2017-07-02 ENCOUNTER — Encounter (HOSPITAL_COMMUNITY): Payer: Self-pay | Admitting: General Practice

## 2017-07-02 DIAGNOSIS — I25119 Atherosclerotic heart disease of native coronary artery with unspecified angina pectoris: Secondary | ICD-10-CM | POA: Diagnosis present

## 2017-07-02 DIAGNOSIS — I10 Essential (primary) hypertension: Secondary | ICD-10-CM | POA: Diagnosis present

## 2017-07-02 DIAGNOSIS — I2510 Atherosclerotic heart disease of native coronary artery without angina pectoris: Secondary | ICD-10-CM | POA: Diagnosis present

## 2017-07-02 DIAGNOSIS — Z955 Presence of coronary angioplasty implant and graft: Secondary | ICD-10-CM

## 2017-07-02 DIAGNOSIS — Z7982 Long term (current) use of aspirin: Secondary | ICD-10-CM | POA: Diagnosis not present

## 2017-07-02 DIAGNOSIS — I25118 Atherosclerotic heart disease of native coronary artery with other forms of angina pectoris: Secondary | ICD-10-CM | POA: Diagnosis not present

## 2017-07-02 DIAGNOSIS — M170 Bilateral primary osteoarthritis of knee: Secondary | ICD-10-CM | POA: Diagnosis not present

## 2017-07-02 DIAGNOSIS — F419 Anxiety disorder, unspecified: Secondary | ICD-10-CM | POA: Diagnosis not present

## 2017-07-02 DIAGNOSIS — R9439 Abnormal result of other cardiovascular function study: Secondary | ICD-10-CM

## 2017-07-02 DIAGNOSIS — Z880 Allergy status to penicillin: Secondary | ICD-10-CM | POA: Insufficient documentation

## 2017-07-02 DIAGNOSIS — E782 Mixed hyperlipidemia: Secondary | ICD-10-CM | POA: Insufficient documentation

## 2017-07-02 DIAGNOSIS — Z79890 Hormone replacement therapy (postmenopausal): Secondary | ICD-10-CM | POA: Diagnosis not present

## 2017-07-02 DIAGNOSIS — E785 Hyperlipidemia, unspecified: Secondary | ICD-10-CM | POA: Diagnosis present

## 2017-07-02 HISTORY — DX: Unspecified osteoarthritis, unspecified site: M19.90

## 2017-07-02 HISTORY — DX: Other chronic pain: G89.29

## 2017-07-02 HISTORY — PX: CORONARY STENT INTERVENTION: CATH118234

## 2017-07-02 HISTORY — PX: LEFT HEART CATH AND CORONARY ANGIOGRAPHY: CATH118249

## 2017-07-02 HISTORY — PX: CORONARY ANGIOPLASTY WITH STENT PLACEMENT: SHX49

## 2017-07-02 HISTORY — DX: Low back pain: M54.5

## 2017-07-02 HISTORY — DX: Low back pain, unspecified: M54.50

## 2017-07-02 LAB — POCT ACTIVATED CLOTTING TIME: Activated Clotting Time: 521 seconds

## 2017-07-02 LAB — PROTIME-INR
INR: 0.97
PROTHROMBIN TIME: 12.9 s (ref 11.4–15.2)

## 2017-07-02 SURGERY — LEFT HEART CATH AND CORONARY ANGIOGRAPHY
Anesthesia: LOCAL

## 2017-07-02 MED ORDER — TICAGRELOR 90 MG PO TABS
ORAL_TABLET | ORAL | Status: DC | PRN
  Administered 2017-07-02: 180 mg via ORAL

## 2017-07-02 MED ORDER — LIDOCAINE HCL (PF) 1 % IJ SOLN
INTRAMUSCULAR | Status: AC
  Filled 2017-07-02: qty 30

## 2017-07-02 MED ORDER — SODIUM CHLORIDE 0.9 % IV SOLN: 250.0000 mL | INTRAVENOUS | Status: DC | PRN

## 2017-07-02 MED ORDER — HEPARIN (PORCINE) IN NACL 2-0.9 UNIT/ML-% IJ SOLN
INTRAMUSCULAR | Status: AC | PRN
  Administered 2017-07-02: 1000 mL

## 2017-07-02 MED ORDER — SODIUM CHLORIDE 0.9 % WEIGHT BASED INFUSION: 1.0000 mL/kg/h | INTRAVENOUS | Status: DC

## 2017-07-02 MED ORDER — SODIUM CHLORIDE 0.9% FLUSH: 3.0000 mL | INTRAVENOUS | Status: DC | PRN

## 2017-07-02 MED ORDER — HEPARIN SODIUM (PORCINE) 1000 UNIT/ML IJ SOLN
INTRAMUSCULAR | Status: AC
  Filled 2017-07-02: qty 1

## 2017-07-02 MED ORDER — HEPARIN (PORCINE) IN NACL 2-0.9 UNIT/ML-% IJ SOLN
INTRAMUSCULAR | Status: AC
  Filled 2017-07-02: qty 1000

## 2017-07-02 MED ORDER — BIVALIRUDIN TRIFLUOROACETATE 250 MG IV SOLR
INTRAVENOUS | Status: AC
  Filled 2017-07-02: qty 250

## 2017-07-02 MED ORDER — MIDAZOLAM HCL 2 MG/2ML IJ SOLN
INTRAMUSCULAR | Status: DC | PRN
  Administered 2017-07-02: 2 mg via INTRAVENOUS
  Administered 2017-07-02: 1 mg via INTRAVENOUS

## 2017-07-02 MED ORDER — NITROGLYCERIN 1 MG/10 ML FOR IR/CATH LAB
INTRA_ARTERIAL | Status: AC
  Filled 2017-07-02: qty 10

## 2017-07-02 MED ORDER — IOPAMIDOL (ISOVUE-370) INJECTION 76%
INTRAVENOUS | Status: AC
  Filled 2017-07-02: qty 100

## 2017-07-02 MED ORDER — ANGIOPLASTY BOOK
Freq: Once | Status: AC
  Administered 2017-07-02: 21:00:00
  Filled 2017-07-02: qty 1

## 2017-07-02 MED ORDER — NITROGLYCERIN 1 MG/10 ML FOR IR/CATH LAB
INTRA_ARTERIAL | Status: DC | PRN
  Administered 2017-07-02: 200 ug via INTRA_ARTERIAL
  Administered 2017-07-02 (×2): 100 ug via INTRACORONARY
  Administered 2017-07-02: 200 ug via INTRACORONARY
  Administered 2017-07-02 (×2): 100 ug via INTRACORONARY

## 2017-07-02 MED ORDER — MIDAZOLAM HCL 2 MG/2ML IJ SOLN
INTRAMUSCULAR | Status: AC
  Filled 2017-07-02: qty 2

## 2017-07-02 MED ORDER — IOPAMIDOL (ISOVUE-370) INJECTION 76%
INTRAVENOUS | Status: AC
  Filled 2017-07-02: qty 50

## 2017-07-02 MED ORDER — ROSUVASTATIN CALCIUM 20 MG PO TABS
40.0000 mg | ORAL_TABLET | Freq: Every day | ORAL | Status: DC
  Administered 2017-07-02: 40 mg via ORAL
  Filled 2017-07-02: qty 2

## 2017-07-02 MED ORDER — TICAGRELOR 90 MG PO TABS: 90.0000 mg | ORAL_TABLET | Freq: Two times a day (BID) | ORAL | Status: DC

## 2017-07-02 MED ORDER — ASPIRIN 81 MG PO CHEW
81.0000 mg | CHEWABLE_TABLET | Freq: Every day | ORAL | Status: DC
  Administered 2017-07-03: 81 mg via ORAL
  Filled 2017-07-02: qty 1

## 2017-07-02 MED ORDER — BIVALIRUDIN BOLUS VIA INFUSION - CUPID
INTRAVENOUS | Status: DC | PRN
  Administered 2017-07-02: 61.95 mg via INTRAVENOUS

## 2017-07-02 MED ORDER — ASPIRIN 81 MG PO CHEW: 81.0000 mg | CHEWABLE_TABLET | ORAL | Status: DC

## 2017-07-02 MED ORDER — LABETALOL HCL 5 MG/ML IV SOLN: 10.0000 mg | INTRAVENOUS | Status: AC | PRN

## 2017-07-02 MED ORDER — SODIUM CHLORIDE 0.9 % WEIGHT BASED INFUSION
3.0000 mL/kg/h | INTRAVENOUS | Status: DC
  Administered 2017-07-02: 3 mL/kg/h via INTRAVENOUS

## 2017-07-02 MED ORDER — SODIUM CHLORIDE 0.9% FLUSH: 3.0000 mL | Freq: Two times a day (BID) | INTRAVENOUS | Status: DC

## 2017-07-02 MED ORDER — ALPRAZOLAM 0.5 MG PO TABS: 1.0000 mg | ORAL_TABLET | Freq: Two times a day (BID) | ORAL | Status: DC | PRN

## 2017-07-02 MED ORDER — ASPIRIN 81 MG PO TABS: 81.0000 mg | ORAL_TABLET | Freq: Every day | ORAL | Status: DC

## 2017-07-02 MED ORDER — LIDOCAINE HCL (PF) 1 % IJ SOLN
INTRAMUSCULAR | Status: DC | PRN
  Administered 2017-07-02: 3 mL

## 2017-07-02 MED ORDER — VERAPAMIL HCL 2.5 MG/ML IV SOLN
INTRAVENOUS | Status: DC | PRN
  Administered 2017-07-02 (×2): 10 mL via INTRA_ARTERIAL

## 2017-07-02 MED ORDER — TICAGRELOR 90 MG PO TABS
ORAL_TABLET | ORAL | Status: AC
  Filled 2017-07-02: qty 2

## 2017-07-02 MED ORDER — FENTANYL CITRATE (PF) 100 MCG/2ML IJ SOLN
INTRAMUSCULAR | Status: AC
  Filled 2017-07-02: qty 2

## 2017-07-02 MED ORDER — IOPAMIDOL (ISOVUE-370) INJECTION 76%
INTRAVENOUS | Status: DC | PRN
  Administered 2017-07-02: 225 mL via INTRA_ARTERIAL

## 2017-07-02 MED ORDER — ONDANSETRON HCL 4 MG/2ML IJ SOLN: 4.0000 mg | Freq: Four times a day (QID) | INTRAMUSCULAR | Status: DC | PRN

## 2017-07-02 MED ORDER — SODIUM CHLORIDE 0.9 % IV SOLN
INTRAVENOUS | Status: AC
  Administered 2017-07-02: 19:00:00 via INTRAVENOUS

## 2017-07-02 MED ORDER — SODIUM CHLORIDE 0.9 % IV SOLN
INTRAVENOUS | Status: AC | PRN
  Administered 2017-07-02 (×2): 1.75 mg/kg/h via INTRAVENOUS

## 2017-07-02 MED ORDER — DIAZEPAM 5 MG PO TABS: 5.0000 mg | ORAL_TABLET | Freq: Four times a day (QID) | ORAL | Status: DC | PRN

## 2017-07-02 MED ORDER — METOPROLOL TARTRATE 25 MG PO TABS
25.0000 mg | ORAL_TABLET | Freq: Two times a day (BID) | ORAL | Status: DC
  Administered 2017-07-02 – 2017-07-03 (×2): 25 mg via ORAL
  Filled 2017-07-02 (×2): qty 1

## 2017-07-02 MED ORDER — FENTANYL CITRATE (PF) 100 MCG/2ML IJ SOLN
INTRAMUSCULAR | Status: DC | PRN
  Administered 2017-07-02 (×2): 25 ug via INTRAVENOUS

## 2017-07-02 MED ORDER — HEPARIN SODIUM (PORCINE) 1000 UNIT/ML IJ SOLN
INTRAMUSCULAR | Status: DC | PRN
  Administered 2017-07-02: 4500 [IU] via INTRAVENOUS

## 2017-07-02 MED ORDER — TICAGRELOR 90 MG PO TABS
90.0000 mg | ORAL_TABLET | Freq: Two times a day (BID) | ORAL | Status: DC
  Administered 2017-07-03 (×2): 90 mg via ORAL
  Filled 2017-07-02 (×2): qty 1

## 2017-07-02 MED ORDER — HYDRALAZINE HCL 20 MG/ML IJ SOLN: 5.0000 mg | INTRAMUSCULAR | Status: AC | PRN

## 2017-07-02 MED ORDER — VERAPAMIL HCL 2.5 MG/ML IV SOLN
INTRAVENOUS | Status: AC
  Filled 2017-07-02: qty 2

## 2017-07-02 MED ORDER — ACETAMINOPHEN 325 MG PO TABS: 650.0000 mg | ORAL_TABLET | ORAL | Status: DC | PRN

## 2017-07-02 SURGICAL SUPPLY — 23 items
BALLN EMERGE MR 2.25X12 (BALLOONS) ×2
BALLN WOLVERINE 2.00X10 (BALLOONS) ×2
BALLN ~~LOC~~ EMERGE MR 2.75X12 (BALLOONS) ×2
BALLOON EMERGE MR 2.25X12 (BALLOONS) ×1 IMPLANT
BALLOON WOLVERINE 2.00X10 (BALLOONS) ×1 IMPLANT
BALLOON ~~LOC~~ EMERGE MR 2.75X12 (BALLOONS) ×1 IMPLANT
CATH INFINITI 5 FR JL3.5 (CATHETERS) ×2 IMPLANT
CATH INFINITI 5FR ANG PIGTAIL (CATHETERS) ×2 IMPLANT
CATH INFINITI JR4 5F (CATHETERS) ×2 IMPLANT
CATH LAUNCHER 6FR EBU3.5 (CATHETERS) ×2 IMPLANT
CATH OPTITORQUE TIG 4.0 5F (CATHETERS) ×2 IMPLANT
DEVICE RAD COMP TR BAND LRG (VASCULAR PRODUCTS) ×2 IMPLANT
GLIDESHEATH SLEND SS 6F .021 (SHEATH) ×2 IMPLANT
GUIDEWIRE INQWIRE 1.5J.035X260 (WIRE) ×1 IMPLANT
INQWIRE 1.5J .035X260CM (WIRE) ×2
KIT ENCORE 26 ADVANTAGE (KITS) ×2 IMPLANT
KIT HEART LEFT (KITS) ×2 IMPLANT
PACK CARDIAC CATHETERIZATION (CUSTOM PROCEDURE TRAY) ×2 IMPLANT
STENT SYNERGY DES 2.75X16 (Permanent Stent) ×2 IMPLANT
SYR MEDRAD MARK V 150ML (SYRINGE) ×2 IMPLANT
TRANSDUCER W/STOPCOCK (MISCELLANEOUS) ×2 IMPLANT
TUBING CIL FLEX 10 FLL-RA (TUBING) ×2 IMPLANT
WIRE MARVEL STR TIP 190CM (WIRE) ×4 IMPLANT

## 2017-07-02 NOTE — H&P (View-Only) (Signed)
Cardiology Office Note:    Date:  06/27/2017   ID:  Elijah Marquez, DOB 1971-10-12, MRN [id number]  PCP:  [name], MD  Cardiologist:  Jenne Campus, MD    Referring MD: [name], MD   Chief Complaint  Patient presents with  . Abnormal Nuclear  I'm having exertional chest pain  History of Present Illness:    Elijah Marquez is a 46 y.o. male  essential chest pain and abnormal stress test. He is here today to discuss results of his test. His stressed  indicated presence of non-proximal LAD lesion which is hemodynamically significant Duke score -> -2 ( Moderate) . On top of that he does have fairly typical exertional angina. We discussed option for the situation option being medical therapy versus cardiac catheterization. I recommended cardiac catheterization. Procedure was explained to the pt including  all risks and benefits as well as alternatives. He agreed to proceed. We'll schedule him electively for next week. I will ask him to continue with aspirin, we'll double the dose of Crestor, he will be taking 20 mg Crestor daily, I will put him on metoprolol 25 mg twice daily, he does have nitroglycerin and knows how to take it. He was advised not to exercise until we'll do cardiac catheterization. He was advised to take nitroglycerin for chest pain and go to the emergency room if pain is not relieved by rest  Past Medical History:  Diagnosis Date  . Anxiety 08/23/2012  . Anxiety as acute reaction to exceptional stress 09/20/2012  . Back pain 07/29/2013  . Chicken pox as a child  . Fatigue   . History of tobacco abuse    Failed Chantix and wellbutrin   . Hyperlipidemia   . Hyperlipidemia 08/23/2012  . Hypogonadism in male 10/02/2014  . Intermittent claudication (HCC) 08/13/2015   B/l legs  . Neck pain on left side 08/23/2012  . Osteoarthritis of both knees   . Osteoarthritis of left knee    and right knee  . Tobacco abuse     Past Surgical History:  Procedure  Laterality Date  . KNEE SURGERY     right knee 2012, left knee 2011 (arthroscopy)  . NECK SURGERY  12/08/12  . WISDOM TOOTH EXTRACTION      Current Medications: Current Meds  Medication Sig  . ALPRAZolam (XANAX) 1 MG tablet Take 1 tablet (1 mg total) by mouth 2 (two) times daily as needed for sleep or anxiety.  Marland Kitchen aspirin 81 MG tablet Take 1 tablet (81 mg total) by mouth daily.  Marland Kitchen HYDROcodone-acetaminophen (NORCO) 10-325 MG tablet Take 1 tablet by mouth every 6 (six) hours as needed for moderate pain or severe pain.  . nitroGLYCERIN (NITROSTAT) 0.4 MG SL tablet Place 1 tablet (0.4 mg total) under the tongue every 5 (five) minutes as needed for chest pain. To ER if no resolution  . rosuvastatin (CRESTOR) 10 MG tablet Take 1 tablet (10 mg total) by mouth daily.  Marland Kitchen testosterone (ANDROGEL) 50 MG/5GM (1%) GEL 8 pumps daily     Allergies:   Atorvastatin and Penicillins   Social History   Social History  . Marital status: Married    Spouse name: N/A  . Number of children: N/A  . Years of education: N/A   Social History Main Topics  . Smoking status: Former Smoker    Packs/day: 1.50    Years: 25.00    Types: Cigarettes    Quit date: 07/31/2015  . Smokeless tobacco: Never Used  .  Alcohol use No  . Drug use: No  . Sexual activity: Yes    Partners: Female   Other Topics Concern  . None   Social History Narrative  . None     Family History: The patient's family history includes Alcohol abuse in his father; Coronary artery disease in his mother; Emphysema in his sister; Fibromyalgia in his mother and sister; Heart attack in his mother; Heart disease in his mother and sister; Hypertension in his mother and sister; Kidney disease in his daughter; Lupus in his sister; Other in his son; Peripheral vascular disease (age of onset: 76) in his mother; Seizures in his son. ROS:   Please see the history of present illness.    All 14 point review of systems negative except as described per  history of present illness  EKGs/Labs/Other Studies Reviewed:      Recent Labs: 06/17/2017: ALT 26; BUN 14; Creatinine, Ser 1.29; Hemoglobin 14.1; Magnesium 1.9; Platelets 212.0; Potassium 3.8; Sodium 137; TSH 2.48  Recent Lipid Panel    Component Value Date/Time   CHOL 135 06/17/2017 1738   TRIG 252.0 (H) 06/17/2017 1738   HDL 36.20 (L) 06/17/2017 1738   CHOLHDL 4 06/17/2017 1738   VLDL 50.4 (H) 06/17/2017 1738   LDLCALC 77 01/17/2017 1728   LDLDIRECT 77.0 06/17/2017 1738    Physical Exam:    VS:  BP 122/80   Pulse 80   Resp 10   Ht 5\' 9"  (1.753 m)   Wt 182 lb 3.2 oz (82.6 kg)   BMI 26.91 kg/m     Wt Readings from Last 3 Encounters:  06/27/17 182 lb 3.2 oz (82.6 kg)  06/26/17 183 lb (83 kg)  06/19/17 183 lb 1.9 oz (83.1 kg)     GEN:  Well nourished, well developed in no acute distress HEENT: Normal NECK: No JVD; No carotid bruits LYMPHATICS: No lymphadenopathy CARDIAC: RRR, no murmurs, no rubs, no gallops RESPIRATORY:  Clear to auscultation without rales, wheezing or rhonchi  ABDOMEN: Soft, non-tender, non-distended MUSCULOSKELETAL:  No edema; No deformity  SKIN: Warm and dry LOWER EXTREMITIES: no swelling NEUROLOGIC:  Alert and oriented x 3 PSYCHIATRIC:  Normal affect   ASSESSMENT:    1. Mixed hyperlipidemia   2. Essential hypertension   3. Angina pectoris ([place])    PLAN:    In order of problems listed above:  1. Angina pectoris in this gentleman with abnormal stress test. We'll proceed with cardiac catheterization. Plan and discussion       above. 2. Essential hypertension: Her medications which I will continue I will add metoprolol to his medical regimen. 3. Dyslipidemia: I will increase Crestor.   Medication Adjustments/Labs and Tests Ordered: Current medicines are reviewed at length with the patient today.  Concerns regarding medicines are outlined above.  No orders of the defined types were placed in this encounter.  Medication changes: No  orders of the defined types were placed in this encounter.   Signed, Park Liter, MD, [facility] 06/27/2017 3:56 PM    [place]

## 2017-07-02 NOTE — Care Management Note (Signed)
Case Management Note  Patient Details  Name: Elijah Marquez MRN: [id number] Date of Birth: 1971-11-12  Subjective/Objective:    From home, s/p coronary stent intervention, will be on brilinta. NCM awaiting benefit check.                 Action/Plan: NCM will follow for dc needs.   Expected Discharge Date:                  Expected Discharge Plan:     In-House Referral:     Discharge planning Services  CM Consult  Post Acute Care Choice:    Choice offered to:     DME Arranged:    DME Agency:     HH Arranged:    HH Agency:     Status of Service:  In process, will continue to follow  If discussed at Long Length of Stay Meetings, dates discussed:    Additional Comments:  Zenon Mayo, RN 07/02/2017, 5:23 PM

## 2017-07-02 NOTE — Progress Notes (Signed)
TR BAND REMOVAL  LOCATION:    right radial  DEFLATED PER PROTOCOL:    Yes.    TIME BAND OFF / DRESSING APPLIED:    1930   SITE UPON ARRIVAL:    Level 0  SITE AFTER BAND REMOVAL:    Level 0  CIRCULATION SENSATION AND MOVEMENT:    Within Normal Limits   Yes.    COMMENTS:   Tolerated procedure well 

## 2017-07-02 NOTE — Interval H&P Note (Signed)
Cath Lab Visit (complete for each Cath Lab visit)  Clinical Evaluation Leading to the Procedure:   ACS: No.  Non-ACS:    Anginal Classification: CCS III  Anti-ischemic medical therapy: No Therapy  Non-Invasive Test Results: High-risk stress test findings: cardiac mortality >3%/year  Prior CABG: No previous CABG      History and Physical Interval Note:  07/02/2017 1:37 PM  Elijah Marquez  has presented today for surgery, with the diagnosis of abnormal stress test  The various methods of treatment have been discussed with the patient and family. After consideration of risks, benefits and other options for treatment, the patient has consented to  Procedure(s): Left Heart Cath and Coronary Angiography (N/A) as a surgical intervention .  The patient's history has been reviewed, patient examined, no change in status, stable for surgery.  I have reviewed the patient's chart and labs.  Questions were answered to the patient's satisfaction.     Shelva Majestic

## 2017-07-02 NOTE — Discharge Summary (Signed)
Discharge Summary    Patient ID: Elijah Marquez,  MRN: [id number], DOB/AGE: 1971/01/26 46 y.o.  Admit date: 07/02/2017 Discharge date: 07/03/2017  Primary Care Provider: Penni Homans A Primary Cardiologist: [facility]  Discharge Diagnoses    Active Problems:   CAD (coronary artery disease)   Abnormal nuclear stress test   Hyperlipidemia   Essential hypertension   Allergies Allergies  Allergen Reactions  . Atorvastatin Other (See Comments)    Caused leg pain  . Penicillins Swelling and Rash    Has patient had a PCN reaction causing immediate rash, facial/tongue/throat swelling, SOB or lightheadedness with hypotension: Yes Has patient had a PCN reaction causing severe rash involving mucus membranes or skin necrosis: No Has patient had a PCN reaction that required hospitalization: Yes Has patient had a PCN reaction occurring within the last 10 years: No If all of the above answers are "NO", then may proceed with Cephalosporin use.     Diagnostic Studies/Procedures    LHC: 07/02/17  Conclusion     Mid LAD-2 lesion, 99 %stenosed.  Ost RCA to Prox RCA lesion, 40 %stenosed.  The left ventricular systolic function is normal.  LV end diastolic pressure is normal.  A STENT SYNERGY DES 2.75X16 drug eluting stent was successfully placed.  Mid LAD-1 lesion, 80 %stenosed.  Post intervention, there is a 0% residual stenosis.  Ost 2nd Diag to 2nd Diag lesion, 80 %stenosed.  Post intervention, there is a 20% residual stenosis.   Preserved global LV function with a small area of minimal mid anterolateral hypocontractility and overall ejection fraction at approximately 55%.  Two vessel CAD with complex bifurcation stenosis involving the LAD and diagonal vessel with 80% stenosis in the LAD extending into the diagonal ostium with 99% stenosis in the LAD immediately after the done at the diagonal takeoff and 40% proximal smooth RCA stenosis and a small caliber RCA  vessel.  Successful PCI to the complex bifurcation stenosis utilizing cutting balloon atherotomy to the LAD and diagonal vessel with ultimate DES stenting with insertion of a 2.7516 mm Synergy stent in the LAD, dilated to 2.8, 2 mm, and ultimate PTCA through the stent strut to the ostium of the the diagonal vessel with the 80% diagonal stenoses being reduced to 20%, and a 99% stenosis being reduced to 0%, before the last diagonal dilatation and evidence for mild plaque shifting post Diagonal PTCA into the LAD with narrowing of 10%.    RECOMMENDATION: The patient should continue with dual antiplatelet therapy for minimum of one-year.  Continued smoking cessation is essential.  Hypotency statin therapy (will increase Crestor to 40 mg) and medical therapy will be continued.  The patient will follow-up with Drs. Krasowski/Munley.  _____________   History of Present Illness     Elijah Marquez is a 46 y.o. male  essential chest pain and abnormal stress test. He presented to the office on 06/27/17 to discuss results of his test. His stress test  indicated presence of non-proximal LAD lesion which is hemodynamically significant Duke score -> -2 ( Moderate) . On top of that he did have fairly typical exertional angina. The option was discussed for medical therapy versus cardiac catheterization. Cardiac catheterization was recommended. Procedure was explained to the pt including  all risks and benefits as well as alternatives. He agreed to proceed. He was continued on aspirin, and doubled his dose of Crestor, he will be taking 20 mg Crestor daily, added metoprolol 25 mg twice daily, along with SL nitro. He  was advised not to exercise until cardiac catheterization. He was advised to take nitroglycerin for chest pain and go to the emergency room if pain is not relieved by rest.  Hospital Course     He underwent LHC with Dr. Claiborne Billings noted above with successful PCI of complex bifurcation stenosis of the LAD.  Balloon atherectomy and PCI/DES x1 to the  LAD with PTCA to the ostium of the diag. Plan for DAPT with ASA/Brilinta for at least one year. Home Crestor dose was increased to 40mg  daily. Labs post cath showed stable Cr 0.85 and Hgb 13.1. No complications noted post cath. Worked with cardiac rehab.   He was seen by Dr. Irish Lack and determined stable for discharge home. Follow up in the office has been arranged. Medications are listed below.   General: Well developed, well nourished, male appearing in no acute distress. Head: Normocephalic, atraumatic.  Neck: Supple without bruits, JVD. Lungs:  Resp regular and unlabored, CTA. Heart: RRR, S1, S2, no S3, S4, or murmur; no rub. Abdomen: Soft, non-tender, non-distended with normoactive bowel sounds. No hepatomegaly. No rebound/guarding. No obvious abdominal masses. Extremities: No clubbing, cyanosis, edema. Distal pedal pulses are 2+ bilaterally. R radial cath site stable without bruising or hematoma Neuro: Alert and oriented X 3. Moves all extremities spontaneously. Psych: Normal affect. _____________  Discharge Vitals Blood pressure 120/65, pulse 62, temperature 97.7 F (36.5 C), temperature source Oral, resp. rate 15, height 5\' 9"  (1.753 m), weight 185 lb 3 oz (84 kg), SpO2 98 %.  Filed Weights   07/02/17 1126 07/03/17 0608  Weight: 182 lb (82.6 kg) 185 lb 3 oz (84 kg)    Labs & Radiologic Studies    CBC  Recent Labs  07/03/17 0308  WBC 7.7  HGB 13.1  HCT 38.3*  MCV 85.1  PLT 841   Basic Metabolic Panel  Recent Labs  07/03/17 0308  NA 141  K 4.0  CL 108  CO2 26  GLUCOSE 102*  BUN 9  CREATININE 0.85  CALCIUM 8.6*   Liver Function Tests No results for input(s): AST, ALT, ALKPHOS, BILITOT, PROT, ALBUMIN in the last 72 hours. No results for input(s): LIPASE, AMYLASE in the last 72 hours. Cardiac Enzymes No results for input(s): CKTOTAL, CKMB, CKMBINDEX, TROPONINI in the last 72 hours. BNP Invalid input(s):  POCBNP D-Dimer No results for input(s): DDIMER in the last 72 hours. Hemoglobin A1C No results for input(s): HGBA1C in the last 72 hours. Fasting Lipid Panel No results for input(s): CHOL, HDL, LDLCALC, TRIG, CHOLHDL, LDLDIRECT in the last 72 hours. Thyroid Function Tests No results for input(s): TSH, T4TOTAL, T3FREE, THYROIDAB in the last 72 hours.  Invalid input(s): FREET3 _____________  Dg Chest 2 View  Result Date: 06/18/2017 CLINICAL DATA:  Chest pain for the past 2 months. History of smoking. EXAM: CHEST  2 VIEW COMPARISON:  None. FINDINGS: Normal cardiac silhouette and mediastinal contours. The lungs appear hyperexpanded with flattening the diaphragms and mild diffuse slightly nodular thickening of the pulmonary interstitium. No focal airspace opacities. No pleural effusion or pneumothorax. No evidence of edema. No acute osseus abnormalities. Post lower cervical ACDF, incompletely evaluated. IMPRESSION: Mild lung hyperexpansion and chronic bronchitic change without superimposed acute cardiopulmonary disease. Electronically Signed   By: Sandi Mariscal M.D.   On: 06/18/2017 09:27   Disposition   Pt is being discharged home today in good condition.  Follow-up Plans & Appointments    Follow-up Information    Richardo Priest, MD Follow up on  07/17/2017.   Specialty:  Cardiology Why:  at 9am for your follow up appt.  Contact information: [place] [address] [phone]          Discharge Instructions    Call MD for:  redness, tenderness, or signs of infection (pain, swelling, redness, odor or green/yellow discharge around incision site)    Complete by:  As directed    Diet - low sodium heart healthy    Complete by:  As directed    Discharge instructions    Complete by:  As directed    Radial Site Care Refer to this sheet in the next few weeks. These instructions provide you with information on caring for yourself after your procedure. Your  caregiver may also give you more specific instructions. Your treatment has been planned according to current medical practices, but problems sometimes occur. Call your caregiver if you have any problems or questions after your procedure. HOME CARE INSTRUCTIONS You may shower the day after the procedure.Remove the bandage (dressing) and gently wash the site with plain soap and water.Gently pat the site dry.  Do not apply powder or lotion to the site.  Do not submerge the affected site in water for 3 to 5 days.  Inspect the site at least twice daily.  Do not flex or bend the affected arm for 24 hours.  No lifting over 5 pounds (2.3 kg) for 5 days after your procedure.  Do not drive home if you are discharged the same day of the procedure. Have someone else drive you.  You may drive 24 hours after the procedure unless otherwise instructed by your caregiver.  What to expect: Any bruising will usually fade within 1 to 2 weeks.  Blood that collects in the tissue (hematoma) may be painful to the touch. It should usually decrease in size and tenderness within 1 to 2 weeks.  SEEK IMMEDIATE MEDICAL CARE IF: You have unusual pain at the radial site.  You have redness, warmth, swelling, or pain at the radial site.  You have drainage (other than a small amount of blood on the dressing).  You have chills.  You have a fever or persistent symptoms for more than 72 hours.  You have a fever and your symptoms suddenly get worse.  Your arm becomes pale, cool, tingly, or numb.  You have heavy bleeding from the site. Hold pressure on the site.    PLEASE DO NOT MISS ANY DOSES OF YOUR BRILINTA!!!!! Follow your blood pressures at home and bring log to your follow up appt. Please call the office with any questions.   Increase activity slowly    Complete by:  As directed       Discharge Medications   Current Discharge Medication List    START taking these medications   Details  ticagrelor (BRILINTA) 90 MG  TABS tablet Take 1 tablet (90 mg total) by mouth 2 (two) times daily. Qty: 60 tablet, Refills: 10      CONTINUE these medications which have CHANGED   Details  rosuvastatin (CRESTOR) 20 MG tablet Take 2 tablets (40 mg total) by mouth daily. Qty: 90 tablet, Refills: 3      CONTINUE these medications which have NOT CHANGED   Details  ALPRAZolam (XANAX) 1 MG tablet Take 1 tablet (1 mg total) by mouth 2 (two) times daily as needed for sleep or anxiety. Qty: 60 tablet, Refills: 2   Associated Diagnoses: Anxiety and depression  aspirin 81 MG tablet Take 1 tablet (81 mg total) by mouth daily. Qty: 30 tablet    HYDROcodone-acetaminophen (NORCO) 10-325 MG tablet Take 1 tablet by mouth every 6 (six) hours as needed for moderate pain or severe pain. Qty: 120 tablet, Refills: 0   Associated Diagnoses: Tobacco abuse; Hyperlipidemia, mixed    metoprolol tartrate (LOPRESSOR) 25 MG tablet Take 1 tablet (25 mg total) by mouth 2 (two) times daily. Qty: 180 tablet, Refills: 3    testosterone (ANDROGEL) 50 MG/5GM (1%) GEL 8 pumps daily Qty: 2 Package, Refills: 0   Associated Diagnoses: Tobacco abuse; Hyperlipidemia, mixed    nitroGLYCERIN (NITROSTAT) 0.4 MG SL tablet Place 1 tablet (0.4 mg total) under the tongue every 5 (five) minutes as needed for chest pain. To ER if no resolution Qty: 25 tablet, Refills: 3         Aspirin prescribed at discharge?  Yes High Intensity Statin Prescribed? (Lipitor 40-80mg  or Crestor 20-40mg ): Yes Beta Blocker Prescribed? Yes For EF <40%, was ACEI/ARB Prescribed? No: EF ok ADP Receptor Inhibitor Prescribed? (i.e. Plavix etc.-Includes Medically Managed Patients): Yes For EF <40%, Aldosterone Inhibitor Prescribed? No: EF ok Was EF assessed during THIS hospitalization? Yes Was Cardiac Rehab II ordered? (Included Medically managed Patients): Yes   Outstanding Labs/Studies   FLP/LFTs in 6 weeks if tolerating statin increase.   Duration of Discharge Encounter    Greater than 30 minutes including physician time.  Signed, Reino Bellis NP-C 07/03/2017, 9:02 AM  I have examined the patient and reviewed assessment and plan and discussed with patient.  Agree with above as stated.  Doing well post PCI.  RRR, no wheezing, right radial site intact with 2+ pulse.  Plan discharge later today.  OK to be out of work for 1 week.  Encouraged cardiac rehab.  Continue DAPT.  Larae Grooms

## 2017-07-03 ENCOUNTER — Encounter (HOSPITAL_COMMUNITY): Payer: Self-pay | Admitting: Cardiovascular Disease

## 2017-07-03 DIAGNOSIS — M170 Bilateral primary osteoarthritis of knee: Secondary | ICD-10-CM | POA: Diagnosis not present

## 2017-07-03 DIAGNOSIS — I10 Essential (primary) hypertension: Secondary | ICD-10-CM

## 2017-07-03 DIAGNOSIS — E782 Mixed hyperlipidemia: Secondary | ICD-10-CM

## 2017-07-03 DIAGNOSIS — Z955 Presence of coronary angioplasty implant and graft: Secondary | ICD-10-CM | POA: Diagnosis not present

## 2017-07-03 DIAGNOSIS — I25119 Atherosclerotic heart disease of native coronary artery with unspecified angina pectoris: Secondary | ICD-10-CM | POA: Diagnosis not present

## 2017-07-03 DIAGNOSIS — R9439 Abnormal result of other cardiovascular function study: Secondary | ICD-10-CM | POA: Diagnosis not present

## 2017-07-03 DIAGNOSIS — Z880 Allergy status to penicillin: Secondary | ICD-10-CM | POA: Diagnosis not present

## 2017-07-03 LAB — CBC
HEMATOCRIT: 38.3 % — AB (ref 39.0–52.0)
Hemoglobin: 13.1 g/dL (ref 13.0–17.0)
MCH: 29.1 pg (ref 26.0–34.0)
MCHC: 34.2 g/dL (ref 30.0–36.0)
MCV: 85.1 fL (ref 78.0–100.0)
Platelets: 185 10*3/uL (ref 150–400)
RBC: 4.5 MIL/uL (ref 4.22–5.81)
RDW: 12.5 % (ref 11.5–15.5)
WBC: 7.7 10*3/uL (ref 4.0–10.5)

## 2017-07-03 LAB — BASIC METABOLIC PANEL
ANION GAP: 7 (ref 5–15)
BUN: 9 mg/dL (ref 6–20)
CO2: 26 mmol/L (ref 22–32)
Calcium: 8.6 mg/dL — ABNORMAL LOW (ref 8.9–10.3)
Chloride: 108 mmol/L (ref 101–111)
Creatinine, Ser: 0.85 mg/dL (ref 0.61–1.24)
Glucose, Bld: 102 mg/dL — ABNORMAL HIGH (ref 65–99)
POTASSIUM: 4 mmol/L (ref 3.5–5.1)
SODIUM: 141 mmol/L (ref 135–145)

## 2017-07-03 MED ORDER — TICAGRELOR 90 MG PO TABS: 90.0000 mg | ORAL_TABLET | Freq: Two times a day (BID) | ORAL | 10 refills | Status: DC

## 2017-07-03 MED ORDER — ROSUVASTATIN CALCIUM 20 MG PO TABS: 40.0000 mg | ORAL_TABLET | Freq: Every day | ORAL | 3 refills | Status: DC

## 2017-07-03 MED FILL — BRILINTA 90 MG TABLET: 90 | 30 days supply | Qty: 60 | Fill #0

## 2017-07-03 NOTE — Progress Notes (Signed)
#  1. [place]  @ [facility] # [phone]   BRILINTA 90 MG BID   COVER- YES  CO-PAY- $ 60.00  DEDUCTIBLE NOT MET  PRIOR APPROVAL- NO   PREFERRED PHARMACY : [facility]

## 2017-07-03 NOTE — Progress Notes (Signed)
CARDIAC REHAB PHASE I   PRE:  Rate/Rhythm: 46 SB  BP:  Supine: 120/65  Sitting:   Standing:    SaO2:   MODE:  Ambulation: 1000 ft   POST:  Rate/Rhythm: 66 SR  BP:  Supine:   Sitting: 153/75  Standing:    SaO2:  5631-4970 Pt walked 1000 ft with steady gait. Tolerated well. No CP. Education completed with pt and wife who voiced understanding. Discussed heart healthy diet, NTG use, risk factors, ex ed and stressed importance of brilinta with stent. Case manager to see. Discussed CRP 2 and will refer to [facility]. Pt not sure if he will attend but asked that he consider it.   Graylon Good, RN BSN  07/03/2017 9:19 AM

## 2017-07-03 NOTE — Care Management Note (Signed)
Case Management Note  Patient Details  Name: Elijah Marquez MRN: [id number] Date of Birth: 19-Oct-1971  Subjective/Objective:  From home with wife, pta indep, s/p coronary stent intervention, will be on brilinta. NCM awaiting benefit check, he states he goes to [employer]. They do have brilinta in stock. NCM gave patient the $5 co pay card.                   Action/Plan: NCM will follow for dc needs.   Expected Discharge Date:  07/03/17               Expected Discharge Plan:  Home/Self Care  In-House Referral:     Discharge planning Services  CM Consult  Post Acute Care Choice:    Choice offered to:     DME Arranged:    DME Agency:     HH Arranged:    HH Agency:     Status of Service:  Completed, signed off  If discussed at [employer] of Stay Meetings, dates discussed:    Additional Comments:  Zenon Mayo, RN 07/03/2017, 9:48 AM

## 2017-07-14 ENCOUNTER — Ambulatory Visit: Payer: BLUE CROSS/BLUE SHIELD | Admitting: Cardiology

## 2017-07-16 ENCOUNTER — Telehealth: Payer: Self-pay | Admitting: Family Medicine

## 2017-07-16 DIAGNOSIS — E782 Mixed hyperlipidemia: Secondary | ICD-10-CM

## 2017-07-16 DIAGNOSIS — Z720 Tobacco use: Secondary | ICD-10-CM

## 2017-07-16 NOTE — Progress Notes (Signed)
Cardiology Office Note:    Date:  07/17/2017   ID:  Elijah Marquez, DOB [birth year], MRN [id number]  PCP:  [name], MD  Cardiologist:  Shirlee More, MD    Referring MD: [name], MD    ASSESSMENT:    1. Coronary artery disease involving native coronary artery of native heart with angina pectoris ([place])   2. Mixed hyperlipidemia   3. SOB (shortness of breath)   4. Essential hypertension    PLAN:    In order of problems listed above:  1. Stable continue dual antiplatelet therapy beta blocker and high intensity statin lipids last month are ideal. 2. Stable continue his statin 3. Worsened he will transition to clopidogrel 4. At target continue current treatment including beta blocker   Next appointment: 3 months   Medication Adjustments/Labs and Tests Ordered: Current medicines are reviewed at length with the patient today.  Concerns regarding medicines are outlined above.  No orders of the defined types were placed in this encounter.  Meds ordered this encounter  Medications  . ticagrelor (BRILINTA) 90 MG TABS tablet    Sig: Take 1 tablet (90 mg total) by mouth 2 (two) times daily. Discontinue on 08/03/17 and start plavix    Dispense:  60 tablet    Refill:  10  . clopidogrel (PLAVIX) 75 MG tablet    Sig: Take 1 tablet (75 mg total) by mouth daily. Take 2 tabs = 150 mg the first 2 days    Dispense:  90 tablet    Refill:  3    Chief Complaint  Patient presents with  . Hospitalization Follow-up    hosp flup after LHC with stents   . Shortness of Breath    History of Present Illness:    Elijah Marquez is a 46 y.o. male with a hx of dyslipidemia, CAD and recent PCI and DES to LAD after a high risk MPI last seen 2 weeks ago.He is improved since PCI and stent no angina palpitations syncope TIA with a short of breath and wheezing with Brilinta. He did not have acute coronary syndrome at one month he'll transition to clopidogrel. He is vigorous physically  active walks more than 10,000 steps per day and declines cardiac rehabilitation.. Compliance with diet, lifestyle and medications: Yes Past Medical History:  Diagnosis Date  . Anxiety 08/23/2012  . Anxiety as acute reaction to exceptional stress 09/20/2012  . Chicken pox as a child  . Chronic lower back pain   . Fatigue   . History of tobacco abuse    Failed Chantix and wellbutrin   . Hyperlipidemia 08/23/2012  . Hypogonadism in male 10/02/2014  . Intermittent claudication (HCC) 08/13/2015   B/l legs  . Neck pain on left side 08/23/2012  . Osteoarthritis    "all over" (07/02/2017)  . Osteoarthritis of both knees   . Tobacco abuse     Past Surgical History:  Procedure Laterality Date  . ANTERIOR CERVICAL DECOMP/DISCECTOMY FUSION  12/08/2012   C4,5,,6  . BACK SURGERY    . CORONARY ANGIOPLASTY WITH STENT PLACEMENT  07/02/2017   "1 stent"  . CORONARY STENT INTERVENTION N/A 07/02/2017   Procedure: CORONARY STENT INTERVENTION;  Surgeon: Troy Sine, MD;  Location: [place] [facility];  Service: Cardiovascular;  Laterality: N/A;  LAD/DIAG  . KNEE ARTHROSCOPY Bilateral 2011-2012   left-right  . LEFT HEART CATH AND CORONARY ANGIOGRAPHY N/A 07/02/2017   Procedure: Left Heart Cath and Coronary Angiography;  Surgeon: Troy Sine,  MD;  Location: [place] [facility];  Service: Cardiovascular;  Laterality: N/A;  . MULTIPLE TOOTH EXTRACTIONS    . WISDOM TOOTH EXTRACTION      Current Medications: No outpatient prescriptions have been marked as taking for the 07/17/17 encounter (Office Visit) with Richardo Priest, MD.     Allergies:   Atorvastatin and Penicillins   Social History   Social History  . Marital status: Married    Spouse name: N/A  . Number of children: N/A  . Years of education: N/A   Social History Main Topics  . Smoking status: Former Smoker    Packs/day: 1.50    Years: 35.00    Types: Cigarettes    Quit date: 07/31/2015  . Smokeless tobacco: Never Used  . Alcohol  use No  . Drug use: No  . Sexual activity: Not Currently    Partners: Female   Other Topics Concern  . None   Social History Narrative  . None     Family History: The patient's family history includes Alcohol abuse in his father; Coronary artery disease in his mother; Emphysema in his sister; Fibromyalgia in his mother and sister; Heart attack in his mother; Heart disease in his mother and sister; Hypertension in his mother and sister; Kidney disease in his daughter; Lupus in his sister; Other in his son; Peripheral vascular disease (age of onset: 66) in his mother; Seizures in his son. ROS:   Please see the history of present illness.    All other systems reviewed and are negative.  EKGs/Labs/Other Studies Reviewed:    The following studies were reviewed today:  Cardiac cath 07/03/17:  Conclusion     Mid LAD-2 lesion, 99 %stenosed.  Ost RCA to Prox RCA lesion, 40 %stenosed.  The left ventricular systolic function is normal.  LV end diastolic pressure is normal.  A STENT SYNERGY DES 2.75X16 drug eluting stent was successfully placed.  Mid LAD-1 lesion, 80 %stenosed.  Post intervention, there is a 0% residual stenosis.  Ost 2nd Diag to 2nd Diag lesion, 80 %stenosed.  Post intervention, there is a 20% residual stenosis.   Preserved global LV function with a small area of minimal mid anterolateral hypocontractility and overall ejection fraction at approximately 55%.  Two vessel CAD with complex bifurcation stenosis involving the LAD and diagonal vessel with 80% stenosis in the LAD extending into the diagonal ostium with 99% stenosis in the LAD immediately after the done at the diagonal takeoff and 40% proximal smooth RCA stenosis and a small caliber RCA vessel.  Successful PCI to the complex bifurcation stenosis utilizing cutting balloon atherotomy to the LAD and diagonal vessel with ultimate DES stenting with insertion of a 2.7516 mm Synergy stent in the LAD,  dilated to 2.8, 2 mm, and ultimate PTCA through the stent strut to the ostium of the the diagonal vessel with the 80% diagonal stenoses being reduced to 20%, and a 99% stenosis being reduced to 0%, before the last diagonal dilatation and evidence for mild plaque shifting post Diagonal PTCA into the LAD with narrowing of 10%.    RECOMMENDATION: The patient should continue with dual antiplatelet therapy for minimum of one-year.  Continued smoking cessation is essential.  Hypotency statin therapy (will increase Crestor to 40 mg) and medical therapy will be continued.  The patient will follow-up with Drs. Krasowski/Zidan Helget    Recent Labs: 06/17/2017: Magnesium 1.9; TSH 2.48 06/27/2017: ALT 34 07/03/2017: BUN 9; Creatinine, Ser 0.85; Hemoglobin 13.1; Platelets 185; Potassium 4.0;  Sodium 141  Recent Lipid Panel    Component Value Date/Time   CHOL 135 06/17/2017 1738   TRIG 252.0 (H) 06/17/2017 1738   HDL 36.20 (L) 06/17/2017 1738   CHOLHDL 4 06/17/2017 1738   VLDL 50.4 (H) 06/17/2017 1738   LDLCALC 77 01/17/2017 1728   LDLDIRECT 77.0 06/17/2017 1738    Physical Exam:    VS:  BP 128/84 (BP Location: Right Arm, Patient Position: Sitting)   Pulse (!) 55   Ht 5\' 9"  (1.753 m)   Wt 182 lb 1.9 oz (82.6 kg)   SpO2 98%   BMI 26.89 kg/m     Wt Readings from Last 3 Encounters:  07/17/17 182 lb 1.9 oz (82.6 kg)  07/03/17 185 lb 3 oz (84 kg)  06/27/17 182 lb 3.2 oz (82.6 kg)     GEN:  Well nourished, well developed in no acute distress HEENT: Normal NECK: No JVD; No carotid bruits LYMPHATICS: No lymphadenopathy CARDIAC: RRR, no murmurs, rubs, gallops RESPIRATORY:  Clear to auscultation without rales, wheezing or rhonchi  ABDOMEN: Soft, non-tender, non-distended MUSCULOSKELETAL:  No edema; No deformity  SKIN: Warm and dry NEUROLOGIC:  Alert and oriented x 3 PSYCHIATRIC:  Normal affect    Signed, Shirlee More, MD  07/17/2017 1:25 PM    [facility]

## 2017-07-17 ENCOUNTER — Ambulatory Visit (INDEPENDENT_AMBULATORY_CARE_PROVIDER_SITE_OTHER): Payer: BLUE CROSS/BLUE SHIELD | Admitting: Cardiology

## 2017-07-17 ENCOUNTER — Encounter: Payer: Self-pay | Admitting: Cardiology

## 2017-07-17 VITALS — BP 128/84 | HR 55 | Ht 69.0 in | Wt 182.1 lb

## 2017-07-17 DIAGNOSIS — I10 Essential (primary) hypertension: Secondary | ICD-10-CM | POA: Diagnosis not present

## 2017-07-17 DIAGNOSIS — R0602 Shortness of breath: Secondary | ICD-10-CM | POA: Insufficient documentation

## 2017-07-17 DIAGNOSIS — I25119 Atherosclerotic heart disease of native coronary artery with unspecified angina pectoris: Secondary | ICD-10-CM | POA: Diagnosis not present

## 2017-07-17 DIAGNOSIS — E782 Mixed hyperlipidemia: Secondary | ICD-10-CM | POA: Diagnosis not present

## 2017-07-17 MED ORDER — TICAGRELOR 90 MG PO TABS: 90.0000 mg | ORAL_TABLET | Freq: Two times a day (BID) | ORAL | 10 refills | Status: DC

## 2017-07-17 MED ORDER — CLOPIDOGREL BISULFATE 75 MG PO TABS: 75.0000 mg | ORAL_TABLET | Freq: Every day | ORAL | 3 refills | Status: DC

## 2017-07-17 MED ORDER — HYDROCODONE-ACETAMINOPHEN 10-325 MG PO TABS: 1.0000 | ORAL_TABLET | Freq: Four times a day (QID) | ORAL | 0 refills | Status: DC | PRN

## 2017-07-17 NOTE — Patient Instructions (Addendum)
Medication Instructions:  Your physician has recommended you make the following change in your medication:  STOP Brilinta September 2 START clopidogrel (Plavix) 75 mg daily September 2, except the first 2 days, double dose on day one and day two.   Labwork: None  Testing/Procedures: None  Follow-Up: Your physician recommends that you schedule a follow-up appointment in: 3 months.   Any Other Special Instructions Will Be Listed Below (If Applicable).     If you need a refill on your cardiac medications before your next appointment, please call your pharmacy.    You need to exercise 150 minutes a week         Mediterranean diet: A heart-healthy eating plan The heart-healthy Mediterranean diet is a healthy eating plan based on typical foods and recipes of Mediterranean-style cooking. Here's how to adopt the Mediterranean diet. By [facility] Staff  If you're looking for a heart-healthy eating plan, the Mediterranean diet might be right for you. The Mediterranean diet incorporates the basics of healthy eating - plus a splash of flavorful olive oil and perhaps a glass of red wine - among other components characterizing the traditional cooking style of countries bordering the [employer]. Most healthy diets include fruits, vegetables, fish and whole grains, and limit unhealthy fats. While these parts of a healthy diet are tried-and-true, subtle variations or differences in proportions of certain foods may make a difference in your risk of heart disease.  Benefits of the [place] has shown that the traditional Mediterranean diet reduces the risk of heart disease. The diet has been associated with a lower level of oxidized low-density lipoprotein (LDL) cholesterol - the "bad" cholesterol that's more likely to build up deposits in your arteries. In fact, a meta-analysis of more than 1.5 million healthy adults demonstrated that following a Mediterranean diet  was associated with a reduced risk of cardiovascular mortality as well as overall mortality. The Mediterranean diet is also associated with a reduced incidence of cancer, and Parkinson's and Alzheimer's diseases. Women who eat a Mediterranean diet supplemented with extra-virgin olive oil and mixed nuts may have a reduced risk of breast cancer. For these reasons, most if not all major scientific organizations encourage healthy adults to adapt a style of eating like that of the [place] for prevention of major chronic diseases.  Key components of the Mediterranean diet The Mediterranean diet emphasizes: Eating primarily plant-based foods, such as fruits and vegetables, whole grains, legumes and nuts  Replacing butter with healthy fats such as olive oil and canola oil  Using herbs and spices instead of salt to flavor foods  Limiting red meat to no more than a few times a month  Eating fish and poultry at least twice a week  Enjoying meals with family and friends  Drinking red wine in moderation (optional)  Getting plenty of exercise Fruits, vegetables, nuts and grains  The Mediterranean diet traditionally includes fruits, vegetables, pasta and rice. For example, residents of [place] eat very little red meat and average nine servings a day of antioxidant-rich fruits and vegetables.  Grains in the [place] region are typically whole grain and usually contain very few unhealthy trans fats, and bread is an important part of the diet there. However, throughout the [place] region, bread is eaten plain or dipped in olive oil - not eaten with butter or margarines, which contain saturated or trans fats.  Nuts are another part of a healthy Mediterranean diet. Nuts are high in fat (approximately 80 percent of  their calories come from fat), but most of the fat is not saturated. Because nuts are high in calories, they should not be eaten in large amounts - generally no more than a handful a day.  Avoid candied or honey-roasted and heavily salted nuts.  Healthy fats The focus of the Mediterranean diet isn't on limiting total fat consumption, but rather to make wise choices about the types of fat you eat. The Mediterranean diet discourages saturated fats and hydrogenated oils (trans fats), both of which contribute to heart disease. The Mediterranean diet features olive oil as the primary source of fat. Olive oil provides monounsaturated fat - a type of fat that can help reduce LDL cholesterol levels when used in place of saturated or trans fats.  "Extra-virgin" and "virgin" olive oils - the least processed forms - also contain the highest levels of the protective plant compounds that provide antioxidant effects. Monounsaturated fats and polyunsaturated fats, such as canola oil and some nuts, contain the beneficial linolenic acid (a type of omega-3 fatty acid). Omega-3 fatty acids lower triglycerides, decrease blood clotting, are associated with decreased sudden heart attack, improve the health of your blood vessels, and help moderate blood pressure. Fatty fish - such as mackerel, lake trout, herring, sardines, albacore tuna and salmon - are rich sources of omega-3 fatty acids. Fish is eaten on a regular basis in the [place].  Wine The health effects of alcohol have been debated for many years, and some doctors are reluctant to encourage alcohol consumption because of the health consequences of excessive drinking. However, alcohol - in moderation - has been associated with a reduced risk of heart disease in some research studies. The Mediterranean diet typically includes a moderate amount of wine. This means no more than 5 ounces (148 milliliters) of wine daily for women (or men over age 72), and no more than 10 ounces (296 milliliters) of wine daily for men under age 65. If you're unable to limit your alcohol intake to the amounts defined above, if you have a personal or family history of  alcohol abuse, or if you have heart or liver disease, refrain from drinking wine or any other alcohol.  Putting it all together The Mediterranean diet is a delicious and healthy way to eat. Many people who switch to this style of eating say they'll never eat any other way. Here are some specific steps to get you started: Eat your veggies and fruits - and switch to whole grains. An abundance and variety of plant foods should make up the majority of your meals. Strive for seven to 10 servings a day of veggies and fruits. Switch to whole-grain bread and cereal, and begin to eat more whole-grain rice and pasta products.  Go nuts. Keep almonds, cashews, pistachios and walnuts on hand for a quick snack. Choose natural peanut butter, rather than the kind with hydrogenated fat added. Try tahini (blended sesame seeds) as a dip or spread for bread.  Pass on the butter. Try olive or canola oil as a healthy replacement for butter or margarine. Use it in cooking. Dip bread in flavored olive oil or lightly spread it on whole-grain bread for a tasty alternative to butter. Or try tahini as a dip or spread.  Spice it up. Herbs and spices make food tasty and are also rich in health-promoting substances. Season your meals with herbs and spices rather than salt.  Go fish. Eat fish once or twice a week. Fresh or water-packed tuna, salmon, trout,  mackerel and herring are healthy choices. Grilled fish tastes good and requires little cleanup. Avoid fried fish, unless it's sauteed in a small amount of canola oil.  Rein in the red meat. Substitute fish and poultry for red meat. When eaten, make sure it's lean and keep portions small (about the size of a deck of cards). Also avoid sausage, bacon and other high-fat meats.  Choose low-fat dairy. Limit higher fat dairy products such as whole or 2 percent milk, cheese and ice cream. Switch to skim milk, fat-free yogurt and low-fat cheese.  Raise a glass to healthy eating. If it's OK  with your doctor, have a glass of wine at dinner. If you don't drink alcohol, you don't need to start. Drinking purple grape juice may be an alternative to wine.   You can resume your androgen supplementation

## 2017-07-18 MED FILL — HYDROCODON-APAP 10-325: 10-325 | 30 days supply | Qty: 120 | Fill #0

## 2017-07-21 MED FILL — ROSUVASTATIN CALCIUM 20 MG: 20 | 30 days supply | Qty: 30 | Fill #1

## 2017-07-28 ENCOUNTER — Ambulatory Visit: Payer: BLUE CROSS/BLUE SHIELD | Admitting: Cardiology

## 2017-07-28 MED FILL — METOPROLOL TARTRATE 25 MG T: 25 | 30 days supply | Qty: 60 | Fill #1

## 2017-07-29 ENCOUNTER — Ambulatory Visit: Payer: BLUE CROSS/BLUE SHIELD | Admitting: Cardiology

## 2017-07-29 ENCOUNTER — Ambulatory Visit (INDEPENDENT_AMBULATORY_CARE_PROVIDER_SITE_OTHER): Payer: BLUE CROSS/BLUE SHIELD | Admitting: Family Medicine

## 2017-07-29 ENCOUNTER — Encounter: Payer: Self-pay | Admitting: Family Medicine

## 2017-07-29 VITALS — BP 108/68 | HR 61 | Temp 98.1°F | Ht 69.0 in | Wt 183.0 lb

## 2017-07-29 DIAGNOSIS — F329 Major depressive disorder, single episode, unspecified: Secondary | ICD-10-CM | POA: Diagnosis not present

## 2017-07-29 DIAGNOSIS — Z0000 Encounter for general adult medical examination without abnormal findings: Secondary | ICD-10-CM | POA: Diagnosis not present

## 2017-07-29 DIAGNOSIS — F32A Depression, unspecified: Secondary | ICD-10-CM

## 2017-07-29 DIAGNOSIS — I25119 Atherosclerotic heart disease of native coronary artery with unspecified angina pectoris: Secondary | ICD-10-CM

## 2017-07-29 DIAGNOSIS — I10 Essential (primary) hypertension: Secondary | ICD-10-CM | POA: Diagnosis not present

## 2017-07-29 DIAGNOSIS — E782 Mixed hyperlipidemia: Secondary | ICD-10-CM

## 2017-07-29 DIAGNOSIS — F419 Anxiety disorder, unspecified: Secondary | ICD-10-CM

## 2017-07-29 DIAGNOSIS — E291 Testicular hypofunction: Secondary | ICD-10-CM

## 2017-07-29 DIAGNOSIS — Z87891 Personal history of nicotine dependence: Secondary | ICD-10-CM

## 2017-07-29 NOTE — Assessment & Plan Note (Addendum)
Patient encouraged to maintain heart healthy diet, regular exercise, adequate sleep. Consider daily probiotics. Take medications as prescribed 

## 2017-07-29 NOTE — Assessment & Plan Note (Signed)
Tolerating statin, encouraged heart healthy diet, avoid trans fats, minimize simple carbs and saturated fats. Increase exercise as tolerated 

## 2017-07-29 NOTE — Assessment & Plan Note (Signed)
Well controlled, no changes to meds. Encouraged heart healthy diet such as the DASH diet and exercise as tolerated.  °

## 2017-07-29 NOTE — Assessment & Plan Note (Signed)
Continues to abstain from smoking but is vaping

## 2017-07-29 NOTE — Assessment & Plan Note (Signed)
Testosterone held temporarily during cardiac work up but energy level plummeted and is improving since they let him restarwt it.

## 2017-07-29 NOTE — Assessment & Plan Note (Signed)
Doing well without meds at his new job

## 2017-07-29 NOTE — Patient Instructions (Signed)
Miralax and Benefiber daily or twice daily  Encouraged increased hydration and fiber in diet. Daily probiotics. If bowels not moving can use MOM 2 tbls po in 4 oz of warm prune juice by mouth every 2-3 days. If no results then repeat in 4 hours with  Dulcolax suppository pr, may repeat again in 4 more hours as needed. Seek care if symptoms worsen. Consider daily Miralax and/or Dulcolax if symptoms persist.   Consider change from Metoprolol to Bystolic if the constipation or fatigue do not improve  Preventive Care 40-64 Years, Male Preventive care refers to lifestyle choices and visits with your health care provider that can promote health and wellness. What does preventive care include?  A yearly physical exam. This is also called an annual well check.  Dental exams once or twice a year.  Routine eye exams. Ask your health care provider how often you should have your eyes checked.  Personal lifestyle choices, including: ? Daily care of your teeth and gums. ? Regular physical activity. ? Eating a healthy diet. ? Avoiding tobacco and drug use. ? Limiting alcohol use. ? Practicing safe sex. ? Taking low-dose aspirin every day starting at age 16. What happens during an annual well check? The services and screenings done by your health care provider during your annual well check will depend on your age, overall health, lifestyle risk factors, and family history of disease. Counseling Your health care provider may ask you questions about your:  Alcohol use.  Tobacco use.  Drug use.  Emotional well-being.  Home and relationship well-being.  Sexual activity.  Eating habits.  Work and work Statistician.  Screening You may have the following tests or measurements:  Height, weight, and BMI.  Blood pressure.  Lipid and cholesterol levels. These may be checked every 5 years, or more frequently if you are over 28 years old.  Skin check.  Lung cancer screening. You may have  this screening every year starting at age 48 if you have a 30-pack-year history of smoking and currently smoke or have quit within the past 15 years.  Fecal occult blood test (FOBT) of the stool. You may have this test every year starting at age 66.  Flexible sigmoidoscopy or colonoscopy. You may have a sigmoidoscopy every 5 years or a colonoscopy every 10 years starting at age 53.  Prostate cancer screening. Recommendations will vary depending on your family history and other risks.  Hepatitis C blood test.  Hepatitis B blood test.  Sexually transmitted disease (STD) testing.  Diabetes screening. This is done by checking your blood sugar (glucose) after you have not eaten for a while (fasting). You may have this done every 1-3 years.  Discuss your test results, treatment options, and if necessary, the need for more tests with your health care provider. Vaccines Your health care provider may recommend certain vaccines, such as:  Influenza vaccine. This is recommended every year.  Tetanus, diphtheria, and acellular pertussis (Tdap, Td) vaccine. You may need a Td booster every 10 years.  Varicella vaccine. You may need this if you have not been vaccinated.  Zoster vaccine. You may need this after age 58.  Measles, mumps, and rubella (MMR) vaccine. You may need at least one dose of MMR if you were born in 1957 or later. You may also need a second dose.  Pneumococcal 13-valent conjugate (PCV13) vaccine. You may need this if you have certain conditions and have not been vaccinated.  Pneumococcal polysaccharide (PPSV23) vaccine. You may need  one or two doses if you smoke cigarettes or if you have certain conditions.  Meningococcal vaccine. You may need this if you have certain conditions.  Hepatitis A vaccine. You may need this if you have certain conditions or if you travel or work in places where you may be exposed to hepatitis A.  Hepatitis B vaccine. You may need this if you have  certain conditions or if you travel or work in places where you may be exposed to hepatitis B.  Haemophilus influenzae type b (Hib) vaccine. You may need this if you have certain risk factors.  Talk to your health care provider about which screenings and vaccines you need and how often you need them. This information is not intended to replace advice given to you by your health care provider. Make sure you discuss any questions you have with your health care provider. Document Released: 12/15/2015 Document Revised: 08/07/2016 Document Reviewed: 09/19/2015 Elsevier Interactive Patient Education  2017 [employer]. y  Encouraged increased hydration and fiber in diet. Daily probiotics. If bowels not moving can use MOM 2 tbls po in 4 oz of warm prune juice by mouth every 2-3 days. If no results then repeat in 4 hours with  Dulcolax suppository pr, may repeat again in 4 more hours as needed. Seek care if symptoms worsen. Consider daily Miralax and/or Dulcolax if symptoms persist.

## 2017-08-04 NOTE — Progress Notes (Signed)
Patient ID: Elijah Marquez, male   DOB: 10/24/1971, 46 y.o.   MRN: [id number]   Subjective:    Patient ID: Elijah Marquez, male    DOB: 03-Feb-1971, 46 y.o.   MRN: [id number]  Chief Complaint  Patient presents with  . Annual Exam    Patient is here today for a CPE.    HPI Patient is in today for annual preventative exam and follow up on numerous medical concerns including coronary artery disease, hyperlipidemia, back pain and more. He feels well today. After undergoing cardiac cath and stenting he has had no further chest pain. His neck and arm pain are manageable and stable. Denies CP/palp/SOB/HA/congestion/fevers/GI or GU c/o. Taking meds as prescribed. He has not smoked in over a year and is trying to maintain a heart healthy diet.   Past Medical History:  Diagnosis Date  . Anxiety 08/23/2012  . Anxiety as acute reaction to exceptional stress 09/20/2012  . Chicken pox as a child  . Chronic lower back pain   . Fatigue   . History of tobacco abuse    Failed Chantix and wellbutrin   . Hyperlipidemia 08/23/2012  . Hypogonadism in male 10/02/2014  . Intermittent claudication (HCC) 08/13/2015   B/l legs  . Neck pain on left side 08/23/2012  . Osteoarthritis    "all over" (07/02/2017)  . Osteoarthritis of both knees   . Tobacco abuse     Past Surgical History:  Procedure Laterality Date  . ANTERIOR CERVICAL DECOMP/DISCECTOMY FUSION  12/08/2012   C4,5,,6  . BACK SURGERY    . CORONARY ANGIOPLASTY WITH STENT PLACEMENT  07/02/2017   "1 stent"  . CORONARY STENT INTERVENTION N/A 07/02/2017   Procedure: CORONARY STENT INTERVENTION;  Surgeon: Troy Sine, MD;  Location: [facility];  Service: Cardiovascular;  Laterality: N/A;  LAD/DIAG  . KNEE ARTHROSCOPY Bilateral 2011-2012   left-right  . LEFT HEART CATH AND CORONARY ANGIOGRAPHY N/A 07/02/2017   Procedure: Left Heart Cath and Coronary Angiography;  Surgeon: Troy Sine, MD;  Location: [facility];  Service: Cardiovascular;   Laterality: N/A;  . MULTIPLE TOOTH EXTRACTIONS    . WISDOM TOOTH EXTRACTION      Family History  Problem Relation Age of Onset  . Fibromyalgia Mother   . Coronary artery disease Mother   . Hypertension Mother   . Heart attack Mother   . Heart disease Mother        CAD  . Peripheral vascular disease Mother 58       hemorrhage from vascular procedure  . Fibromyalgia Sister   . Hypertension Sister   . Heart disease Sister   . Other Son        mildly mentally handicap  . Seizures Son   . Lupus Sister   . Emphysema Sister        smoker  . Kidney disease Daughter   . Alcohol abuse Father     Social History   Social History  . Marital status: Married    Spouse name: N/A  . Number of children: N/A  . Years of education: N/A   Occupational History  . Not on file.   Social History Main Topics  . Smoking status: Former Smoker    Packs/day: 1.50    Years: 35.00    Types: Cigarettes    Quit date: 07/31/2015  . Smokeless tobacco: Never Used  . Alcohol use No  . Drug use: No  . Sexual activity: Not Currently  Partners: Female   Other Topics Concern  . Not on file   Social History Narrative  . No narrative on file    Outpatient Medications Prior to Visit  Medication Sig Dispense Refill  . ALPRAZolam (XANAX) 1 MG tablet Take 1 tablet (1 mg total) by mouth 2 (two) times daily as needed for sleep or anxiety. 60 tablet 2  . aspirin 81 MG tablet Take 1 tablet (81 mg total) by mouth daily. 30 tablet   . clopidogrel (PLAVIX) 75 MG tablet Take 1 tablet (75 mg total) by mouth daily. Take 2 tabs = 150 mg the first 2 days 90 tablet 3  . HYDROcodone-acetaminophen (NORCO) 10-325 MG tablet Take 1 tablet by mouth every 6 (six) hours as needed for moderate pain or severe pain. 120 tablet 0  . metoprolol tartrate (LOPRESSOR) 25 MG tablet Take 1 tablet (25 mg total) by mouth 2 (two) times daily. 180 tablet 3  . nitroGLYCERIN (NITROSTAT) 0.4 MG SL tablet Place 1 tablet (0.4 mg total)  under the tongue every 5 (five) minutes as needed for chest pain. To ER if no resolution 25 tablet 3  . rosuvastatin (CRESTOR) 20 MG tablet Take 2 tablets (40 mg total) by mouth daily. 90 tablet 3  . testosterone (ANDROGEL) 50 MG/5GM (1%) GEL 8 pumps daily (Patient taking differently: 4 pumps daily) 2 Package 0  . ticagrelor (BRILINTA) 90 MG TABS tablet Take 1 tablet (90 mg total) by mouth 2 (two) times daily. Discontinue on 08/03/17 and start plavix 60 tablet 10   No facility-administered medications prior to visit.     Allergies  Allergen Reactions  . Atorvastatin Other (See Comments)    Caused leg pain  . Penicillins Swelling and Rash    Has patient had a PCN reaction causing immediate rash, facial/tongue/throat swelling, SOB or lightheadedness with hypotension: Yes Has patient had a PCN reaction causing severe rash involving mucus membranes or skin necrosis: No Has patient had a PCN reaction that required hospitalization: Yes Has patient had a PCN reaction occurring within the last 10 years: No If all of the above answers are "NO", then may proceed with Cephalosporin use.     Review of Systems  Constitutional: Negative for chills, fever and malaise/fatigue.  HENT: Negative for congestion and hearing loss.   Eyes: Negative for discharge.  Respiratory: Negative for cough, sputum production and shortness of breath.   Cardiovascular: Negative for chest pain, palpitations and leg swelling.  Gastrointestinal: Negative for abdominal pain, blood in stool, constipation, diarrhea, heartburn, nausea and vomiting.  Genitourinary: Negative for dysuria, frequency, hematuria and urgency.  Musculoskeletal: Negative for back pain, falls and myalgias.  Skin: Negative for rash.  Neurological: Negative for dizziness, sensory change, loss of consciousness, weakness and headaches.  Endo/Heme/Allergies: Negative for environmental allergies. Does not bruise/bleed easily.  Psychiatric/Behavioral:  Negative for depression and suicidal ideas. The patient is not nervous/anxious and does not have insomnia.        Objective:    Physical Exam  Constitutional: He is oriented to person, place, and time. He appears well-developed and well-nourished. No distress.  HENT:  Head: Normocephalic and atraumatic.  Nose: Nose normal.  Eyes: Right eye exhibits no discharge. Left eye exhibits no discharge.  Neck: Normal range of motion. Neck supple.  Cardiovascular: Normal rate and regular rhythm.   No murmur heard. Pulmonary/Chest: Effort normal and breath sounds normal.  Abdominal: Soft. Bowel sounds are normal. There is no tenderness.  Musculoskeletal: He exhibits no edema.  Neurological: He is alert and oriented to person, place, and time.  Skin: Skin is warm and dry.  Psychiatric: He has a normal mood and affect.  Nursing note and vitals reviewed.   BP 108/68 (BP Location: Left Arm, Patient Position: Sitting, Cuff Size: Normal)   Pulse 61   Temp 98.1 F (36.7 C) (Oral)   Ht 5\' 9"  (1.753 m)   Wt 183 lb (83 kg)   SpO2 96%   BMI 27.02 kg/m  Wt Readings from Last 3 Encounters:  07/29/17 183 lb (83 kg)  07/17/17 182 lb 1.9 oz (82.6 kg)  07/03/17 185 lb 3 oz (84 kg)     Lab Results  Component Value Date   WBC 7.7 07/03/2017   HGB 13.1 07/03/2017   HCT 38.3 (L) 07/03/2017   PLT 185 07/03/2017   GLUCOSE 102 (H) 07/03/2017   CHOL 135 06/17/2017   TRIG 252.0 (H) 06/17/2017   HDL 36.20 (L) 06/17/2017   LDLDIRECT 77.0 06/17/2017   LDLCALC 77 01/17/2017   ALT 34 06/27/2017   AST 26 06/27/2017   NA 141 07/03/2017   K 4.0 07/03/2017   CL 108 07/03/2017   CREATININE 0.85 07/03/2017   BUN 9 07/03/2017   CO2 26 07/03/2017   TSH 2.48 06/17/2017   INR 0.97 07/02/2017    Lab Results  Component Value Date   TSH 2.48 06/17/2017   Lab Results  Component Value Date   WBC 7.7 07/03/2017   HGB 13.1 07/03/2017   HCT 38.3 (L) 07/03/2017   MCV 85.1 07/03/2017   PLT 185 07/03/2017     Lab Results  Component Value Date   NA 141 07/03/2017   K 4.0 07/03/2017   CO2 26 07/03/2017   GLUCOSE 102 (H) 07/03/2017   BUN 9 07/03/2017   CREATININE 0.85 07/03/2017   BILITOT 0.4 06/27/2017   ALKPHOS 93 06/27/2017   AST 26 06/27/2017   ALT 34 06/27/2017   PROT 7.3 06/27/2017   ALBUMIN 4.7 06/27/2017   CALCIUM 8.6 (L) 07/03/2017   ANIONGAP 7 07/03/2017   GFR 63.15 06/17/2017   Lab Results  Component Value Date   CHOL 135 06/17/2017   Lab Results  Component Value Date   HDL 36.20 (L) 06/17/2017   Lab Results  Component Value Date   LDLCALC 77 01/17/2017   Lab Results  Component Value Date   TRIG 252.0 (H) 06/17/2017   Lab Results  Component Value Date   CHOLHDL 4 06/17/2017   No results found for: HGBA1C     Assessment & Plan:   Problem List Items Addressed This Visit    History of tobacco abuse    Continues to abstain from smoking but is vaping       Preventative health care    Patient encouraged to maintain heart healthy diet, regular exercise, adequate sleep. Consider daily probiotics. Take medications as prescribed.       Hyperlipidemia    Tolerating statin, encouraged heart healthy diet, avoid trans fats, minimize simple carbs and saturated fats. Increase exercise as tolerated      Anxiety and depression    Doing well without meds at his new job      Hypogonadism in male    Testosterone held temporarily during cardiac work up but energy level plummeted and is improving since they let him restarwt it.      Essential hypertension    Well controlled, no changes to meds. Encouraged heart healthy diet such as the DASH  diet and exercise as tolerated.       CAD (coronary artery disease)    Has undergone cardiac cath and stenting and is now being maintained on Brilinta and followed by cardiology. He denies any new symptoms since then and feels much better         I am having Mr. Dittus maintain his testosterone, aspirin, nitroGLYCERIN,  ALPRAZolam, metoprolol tartrate, rosuvastatin, HYDROcodone-acetaminophen, ticagrelor, and clopidogrel.  No orders of the defined types were placed in this encounter.    Penni Homans, MD

## 2017-08-04 NOTE — Assessment & Plan Note (Signed)
Has undergone cardiac cath and stenting and is now being maintained on Brilinta and followed by cardiology. He denies any new symptoms since then and feels much better

## 2017-08-06 ENCOUNTER — Other Ambulatory Visit: Payer: Self-pay

## 2017-08-06 ENCOUNTER — Encounter: Payer: Self-pay | Admitting: Cardiology

## 2017-08-06 MED ORDER — ROSUVASTATIN CALCIUM 20 MG PO TABS: 40.0000 mg | ORAL_TABLET | Freq: Every day | ORAL | 3 refills | Status: DC

## 2017-08-06 MED FILL — CLOPIDOGREL 75 MG TABLET: 75 | 30 days supply | Qty: 30 | Fill #0

## 2017-08-06 MED FILL — ROSUVASTATIN CALCIUM 40 MG: 40 | 30 days supply | Qty: 30 | Fill #0

## 2017-08-06 NOTE — Telephone Encounter (Signed)
Patient medication instruction needs to be bid, not qd .He needs refill please call [email or web address]

## 2017-08-06 NOTE — Telephone Encounter (Signed)
Refill sent.

## 2017-08-15 ENCOUNTER — Other Ambulatory Visit: Payer: Self-pay | Admitting: Family Medicine

## 2017-08-15 DIAGNOSIS — E782 Mixed hyperlipidemia: Secondary | ICD-10-CM

## 2017-08-15 DIAGNOSIS — Z720 Tobacco use: Secondary | ICD-10-CM

## 2017-08-19 ENCOUNTER — Telehealth: Payer: Self-pay

## 2017-08-19 DIAGNOSIS — E782 Mixed hyperlipidemia: Secondary | ICD-10-CM

## 2017-08-19 DIAGNOSIS — Z720 Tobacco use: Secondary | ICD-10-CM

## 2017-08-19 NOTE — Telephone Encounter (Signed)
OK to refill both requested meds, same strength, same sig same quantity

## 2017-08-19 NOTE — Telephone Encounter (Signed)
Requesting: Androgel & Norco Contract: 2.16.18 UDS: 6.12.18 low-risk next screen 12.12.18 Last OV: 8.28.18 Next OV: 11.20.18 Last Refill: Androgel Rx given 5.11.18 & Norco given 8.16.18   Please advise

## 2017-08-19 NOTE — Telephone Encounter (Signed)
Relation to pt: self  Call back number: [phone]   Reason for call:  Patient checking on the status of testosterone (ANDROGEL) 50 MG/5GM (1%) GEL and HYDROcodone-acetaminophen (NORCO) 10-325 MG tablet request, please advise request was sent on 08/15/17

## 2017-08-19 NOTE — Telephone Encounter (Signed)
Sent req for Androgel & Norco to PCP/thx dmf

## 2017-08-20 MED ORDER — HYDROCODONE-ACETAMINOPHEN 10-325 MG PO TABS: 1.0000 | ORAL_TABLET | Freq: Four times a day (QID) | ORAL | 0 refills | Status: DC | PRN

## 2017-08-20 MED ORDER — TESTOSTERONE 50 MG/5GM (1%) TD GEL: TRANSDERMAL | 0 refills | Status: DC

## 2017-08-20 MED FILL — HYDROCODON-APAP 10-325: 10-325 | 30 days supply | Qty: 120 | Fill #0

## 2017-08-20 MED FILL — TESTOSTERONE 12.5 MG/1.25 G: 12.5 MG/ACT | 30 days supply | Qty: 300 | Fill #0

## 2017-08-20 MED FILL — ROSUVASTATIN CALCIUM 20 MG: 20 | 30 days supply | Qty: 30 | Fill #2

## 2017-08-20 NOTE — Telephone Encounter (Signed)
Printed Rx's per SB/will fax when signed/thx dmf

## 2017-08-29 MED FILL — METOPROLOL TARTRATE 25 MG T: 25 | 30 days supply | Qty: 60 | Fill #2

## 2017-09-08 MED FILL — CLOPIDOGREL 75 MG TABLET: 75 | 30 days supply | Qty: 30 | Fill #1

## 2017-09-08 MED FILL — ROSUVASTATIN CALCIUM 40 MG: 40 | 30 days supply | Qty: 30 | Fill #1

## 2017-09-18 ENCOUNTER — Telehealth: Payer: Self-pay | Admitting: Family Medicine

## 2017-09-18 DIAGNOSIS — Z720 Tobacco use: Secondary | ICD-10-CM

## 2017-09-18 DIAGNOSIS — E782 Mixed hyperlipidemia: Secondary | ICD-10-CM

## 2017-09-18 NOTE — Telephone Encounter (Signed)
Copied from [place] [address][id number]. Topic: Quick Communication - See Telephone Encounter >> Sep 18, 2017 12:38 PM Rosalin Hawking wrote: CRM for notification. See Telephone encounter for:  09/18/17.   Pt requesting refill on HYDROcodone-acetaminophen (NORCO) 10-325 MG tablet. Please advise.

## 2017-09-19 NOTE — Telephone Encounter (Signed)
Relation to pt: self Call back [phone]   Reason for call:  Checking on the status of message below, please advise regarding HYDROcodone-acetaminophen (NORCO) 10-325 MG tablet, patient will run out Sunday.

## 2017-09-24 NOTE — Telephone Encounter (Signed)
Patient informed PCP is out of office, patient states he's on hold for 30 minutes and he called 72 hours in advance for he's medication and would like a call tomorrow 09/25/17 regarding picking up he's medication, please advise

## 2017-09-24 NOTE — Telephone Encounter (Signed)
OK to refill, do not fill before the weekend. TY.

## 2017-09-24 NOTE — Telephone Encounter (Signed)
Pt has been out since Monday. Pt has tired multiple times to get the script refilled and ready for him to pick up. HYDROCODONE.  Sent to DOD since Charlett Blake out of office today.

## 2017-09-24 NOTE — Telephone Encounter (Signed)
Requesting:   Hydrocodone Contract   Signed on 01/17/2017 UDS   Low risk done on 05/13/2017---next is due on 11/12/2017 Last OV    07/29/2017 Last Refill    #120 no refills on 08/20/2017  Please Advise  Will send to DOD / PCP/ Office manager (due to start date of refill request)

## 2017-09-25 MED ORDER — HYDROCODONE-ACETAMINOPHEN 10-325 MG PO TABS: 1.0000 | ORAL_TABLET | Freq: Four times a day (QID) | ORAL | 0 refills | Status: DC | PRN

## 2017-09-25 MED FILL — HYDROCODON-APAP 10-325: 10-325 | 30 days supply | Qty: 120 | Fill #0

## 2017-09-25 NOTE — Telephone Encounter (Signed)
DOD wanted PCP to review this refill request

## 2017-09-25 NOTE — Telephone Encounter (Signed)
Patient picked up Rx

## 2017-09-25 NOTE — Telephone Encounter (Signed)
Patient called in requesting refill/have printed/given to PCP to sign Tiffany Nature conservation officer) let the patient know that refill has been approved/ready for pickup this afternoon at our office at the front desk.

## 2017-09-25 NOTE — Telephone Encounter (Signed)
OK to refill

## 2017-09-30 MED FILL — METOPROLOL TARTRATE 25 MG T: 25 | 30 days supply | Qty: 60 | Fill #3

## 2017-10-08 MED FILL — CLOPIDOGREL 75 MG TABLET: 75 | 30 days supply | Qty: 30 | Fill #2

## 2017-10-16 NOTE — Progress Notes (Signed)
Cardiology Office Note:    Date:  10/17/2017   ID:  Elijah Marquez, DOB Oct 01, 1971, MRN [id number]  PCP:  [name], MD  Cardiologist:  Shirlee More, MD    Referring MD: [name], MD    ASSESSMENT:    1. Coronary artery disease involving native coronary artery of native heart with angina pectoris ([place])   2. Essential hypertension   3. Mixed hyperlipidemia    PLAN:    In order of problems listed above:  1. Improved he has had no anginal discomfort his breathing is improved and we switched him from Brilinta to clopidogrel EKG today remains normal and I will plan to see him back in my office in 6 months.  He will continue current medical treatment dual antiplatelet for 1 year high intensity statin and beta-blocker. 2. Stable blood pressure target continue treatment beta-blocker 3. Stable continue high intensity statin he is due for lab work in the next few weeks with his PCP   Next appointment: 6 months   Medication Adjustments/Labs and Tests Ordered: Current medicines are reviewed at length with the patient today.  Concerns regarding medicines are outlined above.  Orders Placed This Encounter  Procedures  . EKG 12-Lead   No orders of the defined types were placed in this encounter.   Chief Complaint  Patient presents with  . Follow-up  . Coronary Artery Disease    History of Present Illness:    Elijah Marquez is a 46 y.o. male with a hx of  dyslipidemia, CAD and PCI and DES to LAD on 07/02/17  last seen 3 months ago.. 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  He has had a previous vascular evaluation.  He has some mild exertional dyspnea not severe or limiting. Past Medical History:  Diagnosis Date  . Anxiety 08/23/2012  . Anxiety as acute reaction to exceptional stress 09/20/2012  . Chicken pox as a  child  . Chronic lower back pain   . Fatigue   . History of tobacco abuse    Failed Chantix and wellbutrin   . Hyperlipidemia 08/23/2012  . Hypogonadism in male 10/02/2014  . Intermittent claudication (HCC) 08/13/2015   B/l legs  . Neck pain on left side 08/23/2012  . Osteoarthritis    "all over" (07/02/2017)  . Osteoarthritis of both knees   . Tobacco abuse     Past Surgical History:  Procedure Laterality Date  . ANTERIOR CERVICAL DECOMP/DISCECTOMY FUSION  12/08/2012   C4,5,,6  . BACK SURGERY    . CORONARY ANGIOPLASTY WITH STENT PLACEMENT  07/02/2017   "1 stent"  . CORONARY STENT INTERVENTION N/A 07/02/2017   Performed by Troy Sine, MD at [facility]  . KNEE ARTHROSCOPY Bilateral 2011-2012   left-right  . Left Heart Cath and Coronary Angiography N/A 07/02/2017   Performed by Troy Sine, MD at [facility]  . MULTIPLE TOOTH EXTRACTIONS    . WISDOM TOOTH EXTRACTION      Current Medications: Current Meds  Medication Sig  . ALPRAZolam (XANAX) 1 MG tablet Take 1 tablet (1 mg total) by mouth 2 (two) times daily as needed for sleep or anxiety.  Marland Kitchen aspirin 81 MG tablet Take 1 tablet (81 mg total) by mouth daily.  . clopidogrel (PLAVIX) 75 MG tablet Take 1 tablet (75 mg total)  by mouth daily. Take 2 tabs = 150 mg the first 2 days  . HYDROcodone-acetaminophen (NORCO) 10-325 MG tablet Take 1 tablet by mouth every 6 (six) hours as needed for moderate pain or severe pain.  . nitroGLYCERIN (NITROSTAT) 0.4 MG SL tablet Place 1 tablet (0.4 mg total) under the tongue every 5 (five) minutes as needed for chest pain. To ER if no resolution  . rosuvastatin (CRESTOR) 40 MG tablet daily.  Marland Kitchen testosterone (ANDROGEL) 50 MG/5GM (1%) GEL 8 pumps daily     Allergies:   Atorvastatin and Penicillins   Social History   Socioeconomic History  . Marital status: Married    Spouse name: None  . Number of children: None  . Years of education: None  . Highest education level: None    Social Needs  . Financial resource strain: None  . Food insecurity - worry: None  . Food insecurity - inability: None  . Transportation needs - medical: None  . Transportation needs - non-medical: None  Occupational History  . None  Tobacco Use  . Smoking status: Former Smoker    Packs/day: 1.50    Years: 35.00    Pack years: 52.50    Types: Cigarettes    Last attempt to quit: 07/31/2015    Years since quitting: 2.2  . Smokeless tobacco: Never Used  Substance and Sexual Activity  . Alcohol use: No  . Drug use: No  . Sexual activity: Not Currently    Partners: Female  Other Topics Concern  . None  Social History Narrative  . None     Family History: The patient's family history includes Alcohol abuse in his father; Coronary artery disease in his mother; Emphysema in his sister; Fibromyalgia in his mother and sister; Heart attack in his mother; Heart disease in his mother and sister; Hypertension in his mother and sister; Kidney disease in his daughter; Lupus in his sister; Other in his son; Peripheral vascular disease (age of onset: 56) in his mother; Seizures in his son. ROS:   Please see the history of present illness.    All other systems reviewed and are negative.  EKGs/Labs/Other Studies Reviewed:    The following studies were reviewed today:  EKG:  EKG ordered today.  The ekg ordered today demonstrates sinus rhythm normal  Recent Labs: 06/17/2017: Magnesium 1.9; TSH 2.48 06/27/2017: ALT 34 07/03/2017: BUN 9; Creatinine, Ser 0.85; Hemoglobin 13.1; Platelets 185; Potassium 4.0; Sodium 141  Recent Lipid Panel    Component Value Date/Time   CHOL 135 06/17/2017 1738   TRIG 252.0 (H) 06/17/2017 1738   HDL 36.20 (L) 06/17/2017 1738   CHOLHDL 4 06/17/2017 1738   VLDL 50.4 (H) 06/17/2017 1738   LDLCALC 77 01/17/2017 1728   LDLDIRECT 77.0 06/17/2017 1738    Physical Exam:    VS:  BP 136/90 (BP Location: Right Arm, Patient Position: Sitting, Cuff Size: Normal)    Pulse 61   Ht 5\' 9"  (1.753 m)   Wt 190 lb (86.2 kg)   SpO2 96%   BMI 28.06 kg/m     Wt Readings from Last 3 Encounters:  10/17/17 190 lb (86.2 kg)  07/29/17 183 lb (83 kg)  07/17/17 182 lb 1.9 oz (82.6 kg)     GEN:  Well nourished, well developed in no acute distress HEENT: Normal NECK: No JVD; No carotid bruits LYMPHATICS: No lymphadenopathy CARDIAC: RRR, no murmurs, rubs, gallops RESPIRATORY:  Clear to auscultation without rales, wheezing or rhonchi  ABDOMEN: Soft, non-tender,  non-distended MUSCULOSKELETAL:  No edema; No deformity  SKIN: Warm and dry NEUROLOGIC:  Alert and oriented x 3 PSYCHIATRIC:  Normal affect    Signed, Shirlee More, MD  10/17/2017 4:57 PM    [facility]

## 2017-10-17 ENCOUNTER — Other Ambulatory Visit: Payer: Self-pay | Admitting: *Deleted

## 2017-10-17 ENCOUNTER — Ambulatory Visit (INDEPENDENT_AMBULATORY_CARE_PROVIDER_SITE_OTHER): Payer: BLUE CROSS/BLUE SHIELD | Admitting: Cardiology

## 2017-10-17 ENCOUNTER — Encounter: Payer: Self-pay | Admitting: Cardiology

## 2017-10-17 VITALS — BP 136/90 | HR 61 | Ht 69.0 in | Wt 190.0 lb

## 2017-10-17 DIAGNOSIS — I10 Essential (primary) hypertension: Secondary | ICD-10-CM | POA: Diagnosis not present

## 2017-10-17 DIAGNOSIS — I25119 Atherosclerotic heart disease of native coronary artery with unspecified angina pectoris: Secondary | ICD-10-CM | POA: Diagnosis not present

## 2017-10-17 DIAGNOSIS — E782 Mixed hyperlipidemia: Secondary | ICD-10-CM

## 2017-10-17 NOTE — Patient Instructions (Signed)

## 2017-10-21 ENCOUNTER — Encounter: Payer: Self-pay | Admitting: Family Medicine

## 2017-10-21 ENCOUNTER — Ambulatory Visit (INDEPENDENT_AMBULATORY_CARE_PROVIDER_SITE_OTHER): Payer: BLUE CROSS/BLUE SHIELD | Admitting: Family Medicine

## 2017-10-21 VITALS — BP 140/56 | HR 62 | Temp 97.7°F | Resp 16 | Wt 191.8 lb

## 2017-10-21 DIAGNOSIS — E291 Testicular hypofunction: Secondary | ICD-10-CM

## 2017-10-21 DIAGNOSIS — M542 Cervicalgia: Secondary | ICD-10-CM

## 2017-10-21 DIAGNOSIS — E782 Mixed hyperlipidemia: Secondary | ICD-10-CM

## 2017-10-21 DIAGNOSIS — Z720 Tobacco use: Secondary | ICD-10-CM | POA: Diagnosis not present

## 2017-10-21 DIAGNOSIS — I25119 Atherosclerotic heart disease of native coronary artery with unspecified angina pectoris: Secondary | ICD-10-CM

## 2017-10-21 DIAGNOSIS — I10 Essential (primary) hypertension: Secondary | ICD-10-CM

## 2017-10-21 DIAGNOSIS — E559 Vitamin D deficiency, unspecified: Secondary | ICD-10-CM | POA: Diagnosis not present

## 2017-10-21 MED ORDER — HYDROCODONE-ACETAMINOPHEN 10-325 MG PO TABS: 1.0000 | ORAL_TABLET | Freq: Four times a day (QID) | ORAL | 0 refills | Status: DC | PRN

## 2017-10-21 NOTE — Patient Instructions (Signed)

## 2017-10-21 NOTE — Progress Notes (Signed)
Subjective:  I acted as a Education administrator for [employer]. Elijah Marquez, [place]   Patient ID: Elijah Marquez, male    DOB: 1970/12/07, 46 y.o.   MRN: [id number]  Chief Complaint  Patient presents with  . Follow-up    HPI  Patient is in today for 3 month follow up and overall is doing well. No recent febrile illness or hospitalizations. No new concerns. Continues to struggle with neck, arm and back pain but stays active and works full time. Uses meds only as needed with good results. Has not had any further chest pain and is pleased with his cardiology care.   Patient Care Team: [name], MD as PCP - General (Family Medicine) Malva Cogan, MD as Referring Physician (Orthopedic Surgery) Leeroy Cha, MD as Consulting Physician (Neurosurgery)   Past Medical History:  Diagnosis Date  . Anxiety 08/23/2012  . Anxiety as acute reaction to exceptional stress 09/20/2012  . Chicken pox as a child  . Chronic lower back pain   . Fatigue   . History of tobacco abuse    Failed Chantix and wellbutrin   . Hyperlipidemia 08/23/2012  . Hypogonadism in male 10/02/2014  . Intermittent claudication (HCC) 08/13/2015   B/l legs  . Neck pain on left side 08/23/2012  . Osteoarthritis    "all over" (07/02/2017)  . Osteoarthritis of both knees   . Tobacco abuse     Past Surgical History:  Procedure Laterality Date  . ANTERIOR CERVICAL DECOMP/DISCECTOMY FUSION  12/08/2012   C4,5,,6  . BACK SURGERY    . CORONARY ANGIOPLASTY WITH STENT PLACEMENT  07/02/2017   "1 stent"  . CORONARY STENT INTERVENTION N/A 07/02/2017   Procedure: CORONARY STENT INTERVENTION;  Surgeon: Troy Sine, MD;  Location: [facility];  Service: Cardiovascular;  Laterality: N/A;  LAD/DIAG  . KNEE ARTHROSCOPY Bilateral 2011-2012   left-right  . LEFT HEART CATH AND CORONARY ANGIOGRAPHY N/A 07/02/2017   Procedure: Left Heart Cath and Coronary Angiography;  Surgeon: Troy Sine, MD;  Location: [facility];  Service:  Cardiovascular;  Laterality: N/A;  . MULTIPLE TOOTH EXTRACTIONS    . WISDOM TOOTH EXTRACTION      Family History  Problem Relation Age of Onset  . Fibromyalgia Mother   . Coronary artery disease Mother   . Hypertension Mother   . Heart attack Mother   . Heart disease Mother        CAD  . Peripheral vascular disease Mother 99       hemorrhage from vascular procedure  . Fibromyalgia Sister   . Hypertension Sister   . Heart disease Sister   . Other Son        mildly mentally handicap  . Seizures Son   . Lupus Sister   . Emphysema Sister        smoker  . Kidney disease Daughter   . Alcohol abuse Father     Social History   Socioeconomic History  . Marital status: Married    Spouse name: Not on file  . Number of children: Not on file  . Years of education: Not on file  . Highest education level: Not on file  Social Needs  . Financial resource strain: Not on file  . Food insecurity - worry: Not on file  . Food insecurity - inability: Not on file  . Transportation needs - medical: Not on file  . Transportation needs - non-medical: Not on file  Occupational History  .  Not on file  Tobacco Use  . Smoking status: Former Smoker    Packs/day: 1.50    Years: 35.00    Pack years: 52.50    Types: Cigarettes    Last attempt to quit: 07/31/2015    Years since quitting: 2.2  . Smokeless tobacco: Never Used  Substance and Sexual Activity  . Alcohol use: No  . Drug use: No  . Sexual activity: Not Currently    Partners: Female  Other Topics Concern  . Not on file  Social History Narrative  . Not on file    Outpatient Medications Prior to Visit  Medication Sig Dispense Refill  . ALPRAZolam (XANAX) 1 MG tablet Take 1 tablet (1 mg total) by mouth 2 (two) times daily as needed for sleep or anxiety. 60 tablet 2  . aspirin 81 MG tablet Take 1 tablet (81 mg total) by mouth daily. 30 tablet   . clopidogrel (PLAVIX) 75 MG tablet Take 1 tablet (75 mg total) by mouth daily. Take 2  tabs = 150 mg the first 2 days 90 tablet 3  . metoprolol tartrate (LOPRESSOR) 25 MG tablet Take 1 tablet (25 mg total) by mouth 2 (two) times daily. 180 tablet 3  . nitroGLYCERIN (NITROSTAT) 0.4 MG SL tablet Place 1 tablet (0.4 mg total) under the tongue every 5 (five) minutes as needed for chest pain. To ER if no resolution 25 tablet 3  . rosuvastatin (CRESTOR) 40 MG tablet daily.  3  . testosterone (ANDROGEL) 50 MG/5GM (1%) GEL 8 pumps daily 2 Package 0  . HYDROcodone-acetaminophen (NORCO) 10-325 MG tablet Take 1 tablet by mouth every 6 (six) hours as needed for moderate pain or severe pain. 120 tablet 0   No facility-administered medications prior to visit.     Allergies  Allergen Reactions  . Atorvastatin Other (See Comments)    Caused leg pain  . Penicillins Swelling and Rash    Has patient had a PCN reaction causing immediate rash, facial/tongue/throat swelling, SOB or lightheadedness with hypotension: Yes Has patient had a PCN reaction causing severe rash involving mucus membranes or skin necrosis: No Has patient had a PCN reaction that required hospitalization: Yes Has patient had a PCN reaction occurring within the last 10 years: No If all of the above answers are "NO", then may proceed with Cephalosporin use.     Review of Systems  Constitutional: Negative for fever and malaise/fatigue.  HENT: Negative for congestion.   Eyes: Negative for blurred vision.  Respiratory: Negative for shortness of breath.   Cardiovascular: Negative for chest pain, palpitations and leg swelling.  Gastrointestinal: Negative for abdominal pain, blood in stool and nausea.  Genitourinary: Negative for dysuria and frequency.  Musculoskeletal: Positive for back pain and neck pain. Negative for falls.  Skin: Negative for rash.  Neurological: Negative for dizziness, loss of consciousness and headaches.  Endo/Heme/Allergies: Negative for environmental allergies.  Psychiatric/Behavioral: Negative for  depression. The patient is not nervous/anxious.        Objective:    Physical Exam  Constitutional: He is oriented to person, place, and time. He appears well-developed and well-nourished. No distress.  HENT:  Head: Normocephalic and atraumatic.  Nose: Nose normal.  Eyes: Right eye exhibits no discharge. Left eye exhibits no discharge.  Neck: Normal range of motion. Neck supple.  Cardiovascular: Normal rate and regular rhythm.  No murmur heard. Pulmonary/Chest: Effort normal and breath sounds normal.  Abdominal: Soft. Bowel sounds are normal. There is no tenderness.  Musculoskeletal:  He exhibits no edema.  Neurological: He is alert and oriented to person, place, and time.  Skin: Skin is warm and dry.  Psychiatric: He has a normal mood and affect.  Nursing note and vitals reviewed.   BP (!) 140/56 (BP Location: Right Arm, Patient Position: Sitting, Cuff Size: Small)   Pulse 62   Temp 97.7 F (36.5 C) (Oral)   Resp 16   Wt 191 lb 12.8 oz (87 kg)   SpO2 97%   BMI 28.32 kg/m  Wt Readings from Last 3 Encounters:  10/21/17 191 lb 12.8 oz (87 kg)  10/17/17 190 lb (86.2 kg)  07/29/17 183 lb (83 kg)   BP Readings from Last 3 Encounters:  10/21/17 (!) 140/56  10/17/17 136/90  07/29/17 108/68      There is no immunization history on file for this patient.  Health Maintenance  Topic Date Due  . HIV Screening  05/03/1984  . TETANUS/TDAP  05/03/1988  . INFLUENZA VACCINE  03/02/2019 (Originally 07/02/2017)    Lab Results  Component Value Date   WBC 7.7 07/03/2017   HGB 13.1 07/03/2017   HCT 38.3 (L) 07/03/2017   PLT 185 07/03/2017   GLUCOSE 113 (H) 10/21/2017   CHOL 135 06/17/2017   TRIG 252.0 (H) 06/17/2017   HDL 36.20 (L) 06/17/2017   LDLDIRECT 77.0 06/17/2017   LDLCALC 77 01/17/2017   ALT 31 10/21/2017   AST 24 10/21/2017   NA 139 10/21/2017   K 3.8 10/21/2017   CL 103 10/21/2017   CREATININE 0.82 10/21/2017   BUN 13 10/21/2017   CO2 28 10/21/2017   TSH 2.48  06/17/2017   INR 0.97 07/02/2017    Lab Results  Component Value Date   TSH 2.48 06/17/2017   Lab Results  Component Value Date   WBC 7.7 07/03/2017   HGB 13.1 07/03/2017   HCT 38.3 (L) 07/03/2017   MCV 85.1 07/03/2017   PLT 185 07/03/2017   Lab Results  Component Value Date   NA 139 10/21/2017   K 3.8 10/21/2017   CO2 28 10/21/2017   GLUCOSE 113 (H) 10/21/2017   BUN 13 10/21/2017   CREATININE 0.82 10/21/2017   BILITOT 0.4 10/21/2017   ALKPHOS 71 10/21/2017   AST 24 10/21/2017   ALT 31 10/21/2017   PROT 7.2 10/21/2017   ALBUMIN 4.4 10/21/2017   CALCIUM 9.7 10/21/2017   ANIONGAP 7 07/03/2017   GFR 106.37 10/21/2017   Lab Results  Component Value Date   CHOL 135 06/17/2017   Lab Results  Component Value Date   HDL 36.20 (L) 06/17/2017   Lab Results  Component Value Date   LDLCALC 77 01/17/2017   Lab Results  Component Value Date   TRIG 252.0 (H) 06/17/2017   Lab Results  Component Value Date   CHOLHDL 4 06/17/2017   No results found for: HGBA1C       Assessment & Plan:   Problem List Items Addressed This Visit    Neck pain on left side    Manages to work full time and takes minimal medications. Does get good pain relief when he requires them. Encouraged moist heat and gentle stretching as tolerated. May try NSAIDs and prescription meds as directed and report if symptoms worsen or seek immediate care      Hyperlipidemia    Tolerating statin, encouraged heart healthy diet, avoid trans fats, minimize simple carbs and saturated fats. Increase exercise as tolerated      Hypogonadism in male  Doing well on Testosterone      Essential hypertension - Primary    Well controlled, no changes to meds. Encouraged heart healthy diet such as the DASH diet and exercise as tolerated.       Relevant Orders   Comprehensive metabolic panel (Completed)   Coronary artery disease involving native coronary artery of native heart with angina pectoris [facility])     Doing well, following with cardiology as needed       Other Visit Diagnoses    Tobacco abuse       Relevant Medications   HYDROcodone-acetaminophen (NORCO) 10-325 MG tablet   Hyperlipidemia, mixed       Relevant Medications   HYDROcodone-acetaminophen (NORCO) 10-325 MG tablet   Vitamin D deficiency       Relevant Orders   VITAMIN D 25 Hydroxy (Vit-D Deficiency, Fractures) (Completed)      I am having [name] start on HYDROcodone-acetaminophen and HYDROcodone-acetaminophen. I am also having him maintain his aspirin, nitroGLYCERIN, ALPRAZolam, metoprolol tartrate, clopidogrel, testosterone, rosuvastatin, and HYDROcodone-acetaminophen.  Meds ordered this encounter  Medications  . HYDROcodone-acetaminophen (NORCO) 10-325 MG tablet    Sig: Take 1 tablet by mouth every 6 (six) hours as needed for moderate pain or severe pain.    Dispense:  120 tablet    Refill:  0  . HYDROcodone-acetaminophen (NORCO) 10-325 MG tablet    Sig: Take 1 tablet by mouth every 6 (six) hours as needed. January 2019    Dispense:  120 tablet    Refill:  0  . HYDROcodone-acetaminophen (NORCO) 10-325 MG tablet    Sig: Take 1 tablet by mouth every 6 (six) hours as needed. December 2018 rx    Dispense:  120 tablet    Refill:  0    CMA served as Education administrator during this visit. History, Physical and Plan performed by medical provider. Documentation and orders reviewed and attested to.  Penni Homans, MD

## 2017-10-22 LAB — COMPREHENSIVE METABOLIC PANEL
ALK PHOS: 71 U/L (ref 39–117)
ALT: 31 U/L (ref 0–53)
AST: 24 U/L (ref 0–37)
Albumin: 4.4 g/dL (ref 3.5–5.2)
BILIRUBIN TOTAL: 0.4 mg/dL (ref 0.2–1.2)
BUN: 13 mg/dL (ref 6–23)
CALCIUM: 9.7 mg/dL (ref 8.4–10.5)
CO2: 28 meq/L (ref 19–32)
CREATININE: 0.82 mg/dL (ref 0.40–1.50)
Chloride: 103 mEq/L (ref 96–112)
GFR: 106.37 mL/min (ref >60.00)
GLUCOSE: 113 mg/dL — AB (ref 70–99)
Potassium: 3.8 mEq/L (ref 3.5–5.1)
Sodium: 139 mEq/L (ref 135–145)
TOTAL PROTEIN: 7.2 g/dL (ref 6.0–8.3)

## 2017-10-22 LAB — VITAMIN D 25 HYDROXY (VIT D DEFICIENCY, FRACTURES): VITD: 22.06 ng/mL — ABNORMAL LOW (ref 30.00–100.00)

## 2017-10-22 MED FILL — ROSUVASTATIN CALCIUM 40 MG: 40 | 30 days supply | Qty: 30 | Fill #2

## 2017-10-24 MED FILL — HYDROCODON-APAP 10-325: 10-325 | 30 days supply | Qty: 120 | Fill #0

## 2017-10-26 NOTE — Assessment & Plan Note (Signed)
Well controlled, no changes to meds. Encouraged heart healthy diet such as the DASH diet and exercise as tolerated.  °

## 2017-10-26 NOTE — Assessment & Plan Note (Signed)
Doing well, following with cardiology as needed

## 2017-10-26 NOTE — Assessment & Plan Note (Signed)
Manages to work full time and takes minimal medications. Does get good pain relief when he requires them. Encouraged moist heat and gentle stretching as tolerated. May try NSAIDs and prescription meds as directed and report if symptoms worsen or seek immediate care

## 2017-10-26 NOTE — Assessment & Plan Note (Signed)
Tolerating statin, encouraged heart healthy diet, avoid trans fats, minimize simple carbs and saturated fats. Increase exercise as tolerated 

## 2017-10-26 NOTE — Assessment & Plan Note (Signed)
Doing well on Testosterone

## 2017-10-28 MED ORDER — VITAMIN D (ERGOCALCIFEROL) 1.25 MG (50000 UNIT) PO CAPS: 50000.0000 [IU] | ORAL_CAPSULE | ORAL | 4 refills | Status: DC

## 2017-10-28 MED FILL — VIT D2 1.25 MG (50,000 UNIT: 1.25 MG | 28 days supply | Qty: 4 | Fill #0

## 2017-10-28 NOTE — Addendum Note (Signed)
Addended by: Magdalene Molly A on: 10/28/2017 01:44 PM   Modules accepted: Orders

## 2017-10-29 MED FILL — METOPROLOL TARTRATE 25 MG T: 25 | 30 days supply | Qty: 60 | Fill #4

## 2017-11-06 MED FILL — CLOPIDOGREL 75 MG TABLET: 75 | 30 days supply | Qty: 30 | Fill #3

## 2017-11-21 MED FILL — ROSUVASTATIN CALCIUM 40 MG: 40 | 30 days supply | Qty: 30 | Fill #3

## 2017-11-21 MED FILL — VIT D2 1.25 MG (50,000 UNIT: 1.25 MG | 28 days supply | Qty: 4 | Fill #1

## 2017-11-21 MED FILL — HYDROCODON-APAP 10-325: 10-325 | 30 days supply | Qty: 120 | Fill #0

## 2017-12-05 MED FILL — METOPROLOL TARTRATE 25 MG T: 25 | 30 days supply | Qty: 60 | Fill #5

## 2017-12-09 MED FILL — CLOPIDOGREL 75 MG TABLET: 75 | 30 days supply | Qty: 30 | Fill #4

## 2017-12-22 MED FILL — HYDROCODON-APAP 10-325: 10-325 | 30 days supply | Qty: 120 | Fill #0

## 2017-12-22 MED FILL — ROSUVASTATIN CALCIUM 40 MG: 40 | 30 days supply | Qty: 30 | Fill #4

## 2017-12-22 MED FILL — VIT D2 1.25 MG (50,000 UNIT: 1.25 MG | 28 days supply | Qty: 4 | Fill #2

## 2017-12-30 ENCOUNTER — Other Ambulatory Visit: Payer: Self-pay | Admitting: Family Medicine

## 2017-12-30 DIAGNOSIS — E782 Mixed hyperlipidemia: Secondary | ICD-10-CM

## 2017-12-30 DIAGNOSIS — Z720 Tobacco use: Secondary | ICD-10-CM

## 2017-12-30 MED ORDER — TESTOSTERONE 50 MG/5GM (1%) TD GEL: TRANSDERMAL | 0 refills | Status: DC

## 2017-12-30 NOTE — Telephone Encounter (Signed)
Requesting: TESTOSTERONE 12.5 MG Contract: 01/17/17 UDS: 05/13/17 LOW RISK Last OV: 10/21/17 Next OV: 01/26/18 Last Refill: 08/20/17   Please advise

## 2017-12-30 NOTE — Telephone Encounter (Signed)
/  Requesting: androgel  Contract: 01/17/17 UDS: 05/15/17 Last Visit: 10/21/17 Next Visit: 01/26/18 Last Refill: 08/20/17  Please Advise

## 2017-12-31 MED FILL — TESTOSTERONE 12.5 MG/1.25 G: 12.5 MG/ACT | 30 days supply | Qty: 300 | Fill #0

## 2018-01-08 MED FILL — CLOPIDOGREL 75 MG TABLET: 75 | 30 days supply | Qty: 30 | Fill #5

## 2018-01-08 MED FILL — METOPROLOL TARTRATE 25 MG T: 25 | 30 days supply | Qty: 60 | Fill #6

## 2018-01-21 ENCOUNTER — Other Ambulatory Visit: Payer: Self-pay | Admitting: Family Medicine

## 2018-01-21 DIAGNOSIS — E782 Mixed hyperlipidemia: Secondary | ICD-10-CM

## 2018-01-21 DIAGNOSIS — M549 Dorsalgia, unspecified: Secondary | ICD-10-CM

## 2018-01-21 DIAGNOSIS — Z720 Tobacco use: Secondary | ICD-10-CM

## 2018-01-21 MED FILL — VIT D2 1.25 MG (50,000 UNIT: 1.25 MG | 28 days supply | Qty: 4 | Fill #3

## 2018-01-21 NOTE — Telephone Encounter (Signed)
Copied from [place]. Topic: Quick Communication - Rx Refill/Question >> Jan 21, 2018 11:15 AM Pricilla Handler wrote: Medication: HYDROcodone-acetaminophen ([place]) 10-325 MG tablet Has the patient contacted their pharmacy? Yes.   (Agent: If no, request that the patient contact the pharmacy for the refill.) Preferred Pharmacy (with phone number or street name): [place], [place] - [place] [place] [phone] (Phone) [phone] (Fax)   Agent: Please be advised that RX refills may take up to 3 business days. We ask that you follow-up with your pharmacy.

## 2018-01-21 NOTE — Telephone Encounter (Signed)
Hydrocodone refill Last OV: 10/21/18 Last Refill:12/21/17 Pharmacy: [facility]

## 2018-01-22 MED ORDER — HYDROCODONE-ACETAMINOPHEN 10-325 MG PO TABS: 1.0000 | ORAL_TABLET | Freq: Four times a day (QID) | ORAL | 0 refills | Status: DC | PRN

## 2018-01-22 NOTE — Telephone Encounter (Signed)
Requesting: hydrocodone  Contract: needs to update UDS:05/13/17 low risk next screen 11/12/17 Last OV:10/21/17 Next OV:01/26/18 Last Refill:10/21/17 Database:01/22/18 no concerns    Please advise

## 2018-01-22 NOTE — Telephone Encounter (Signed)
Patient notified

## 2018-01-22 NOTE — Telephone Encounter (Signed)
I printed just update the uds and contract

## 2018-01-23 ENCOUNTER — Other Ambulatory Visit: Payer: BLUE CROSS/BLUE SHIELD

## 2018-01-23 DIAGNOSIS — M549 Dorsalgia, unspecified: Secondary | ICD-10-CM

## 2018-01-23 MED FILL — HYDROCODON-APAP 10-325: 10-325 | 30 days supply | Qty: 120 | Fill #0

## 2018-01-26 ENCOUNTER — Ambulatory Visit: Payer: BLUE CROSS/BLUE SHIELD | Admitting: Family Medicine

## 2018-01-26 LAB — PAIN MGMT, PROFILE 8 W/CONF, U
6 Acetylmorphine: NEGATIVE ng/mL (ref <10)
AMPHETAMINES: NEGATIVE ng/mL (ref <500)
Alcohol Metabolites: NEGATIVE ng/mL (ref <500)
BENZODIAZEPINES: NEGATIVE ng/mL (ref <100)
BUPRENORPHINE, URINE: NEGATIVE ng/mL (ref <5)
Cocaine Metabolite: NEGATIVE ng/mL (ref <150)
Codeine: NEGATIVE ng/mL (ref <50)
Creatinine: 57.9 mg/dL
HYDROCODONE: 1271 ng/mL — AB (ref <50)
Hydromorphone: 206 ng/mL — ABNORMAL HIGH (ref <50)
MDMA: NEGATIVE ng/mL (ref <500)
Marijuana Metabolite: NEGATIVE ng/mL (ref <20)
Morphine: NEGATIVE ng/mL (ref <50)
NORHYDROCODONE: 2103 ng/mL — AB (ref <50)
OPIATES: POSITIVE ng/mL — AB (ref <100)
Oxidant: NEGATIVE ug/mL (ref <200)
Oxycodone: NEGATIVE ng/mL (ref <100)
pH: 5.62 (ref 4.5–9.0)

## 2018-01-28 MED FILL — ROSUVASTATIN CALCIUM 40 MG: 40 | 30 days supply | Qty: 30 | Fill #5

## 2018-02-16 ENCOUNTER — Other Ambulatory Visit (INDEPENDENT_AMBULATORY_CARE_PROVIDER_SITE_OTHER): Payer: Self-pay

## 2018-02-16 ENCOUNTER — Other Ambulatory Visit: Payer: Self-pay

## 2018-02-16 MED ORDER — METOPROLOL TARTRATE 25 MG PO TABS: 25.0000 mg | ORAL_TABLET | Freq: Two times a day (BID) | ORAL | 2 refills | Status: DC

## 2018-02-16 MED FILL — METOPROLOL TARTRATE 25 MG T: 25 | 30 days supply | Qty: 60 | Fill #7

## 2018-02-16 MED FILL — CLOPIDOGREL 75 MG TABLET: 75 | 30 days supply | Qty: 30 | Fill #6

## 2018-02-16 MED FILL — VIT D2 1.25 MG (50,000 UNIT: 1.25 MG | 28 days supply | Qty: 4 | Fill #4

## 2018-02-17 ENCOUNTER — Ambulatory Visit (INDEPENDENT_AMBULATORY_CARE_PROVIDER_SITE_OTHER): Payer: BLUE CROSS/BLUE SHIELD | Admitting: Family Medicine

## 2018-02-17 VITALS — BP 100/60 | HR 57 | Temp 97.1°F | Resp 18 | Wt 185.8 lb

## 2018-02-17 DIAGNOSIS — F411 Generalized anxiety disorder: Secondary | ICD-10-CM | POA: Diagnosis not present

## 2018-02-17 DIAGNOSIS — F419 Anxiety disorder, unspecified: Secondary | ICD-10-CM

## 2018-02-17 DIAGNOSIS — R7989 Other specified abnormal findings of blood chemistry: Secondary | ICD-10-CM

## 2018-02-17 DIAGNOSIS — M542 Cervicalgia: Secondary | ICD-10-CM

## 2018-02-17 DIAGNOSIS — E782 Mixed hyperlipidemia: Secondary | ICD-10-CM

## 2018-02-17 DIAGNOSIS — F329 Major depressive disorder, single episode, unspecified: Secondary | ICD-10-CM

## 2018-02-17 DIAGNOSIS — R739 Hyperglycemia, unspecified: Secondary | ICD-10-CM | POA: Diagnosis not present

## 2018-02-17 DIAGNOSIS — I10 Essential (primary) hypertension: Secondary | ICD-10-CM

## 2018-02-17 DIAGNOSIS — F32A Depression, unspecified: Secondary | ICD-10-CM

## 2018-02-17 DIAGNOSIS — F430 Acute stress reaction: Secondary | ICD-10-CM

## 2018-02-17 MED ORDER — ALPRAZOLAM 1 MG PO TABS: 1.0000 mg | ORAL_TABLET | Freq: Two times a day (BID) | ORAL | 2 refills | Status: DC | PRN

## 2018-02-17 MED FILL — ALPRAZolam 1 MG TABS: 1 | 30 days supply | Qty: 60 | Fill #0

## 2018-02-17 NOTE — Assessment & Plan Note (Signed)
hgba1c acceptable, minimize simple carbs. Increase exercise as tolerated. Continue current meds 

## 2018-02-17 NOTE — Patient Instructions (Signed)

## 2018-02-17 NOTE — Assessment & Plan Note (Signed)
Well controlled, no changes to meds. Encouraged heart healthy diet such as the DASH diet and exercise as tolerated.  °

## 2018-02-17 NOTE — Assessment & Plan Note (Signed)
Encouraged heart healthy diet, increase exercise, avoid trans fats, consider a krill oil cap daily 

## 2018-02-17 NOTE — Assessment & Plan Note (Signed)
d 

## 2018-02-18 LAB — COMPREHENSIVE METABOLIC PANEL
ALK PHOS: 75 U/L (ref 39–117)
ALT: 38 U/L (ref 0–53)
AST: 31 U/L (ref 0–37)
Albumin: 5.1 g/dL (ref 3.5–5.2)
BUN: 10 mg/dL (ref 6–23)
CALCIUM: 10.5 mg/dL (ref 8.4–10.5)
CO2: 29 mEq/L (ref 19–32)
Chloride: 102 mEq/L (ref 96–112)
Creatinine, Ser: 0.88 mg/dL (ref 0.40–1.50)
GFR: 97.91 mL/min (ref >60.00)
GLUCOSE: 87 mg/dL (ref 70–99)
POTASSIUM: 4 meq/L (ref 3.5–5.1)
Sodium: 144 mEq/L (ref 135–145)
TOTAL PROTEIN: 7.9 g/dL (ref 6.0–8.3)
Total Bilirubin: 0.2 mg/dL (ref 0.2–1.2)

## 2018-02-18 LAB — CBC
HCT: 43.1 % (ref 39.0–52.0)
HEMOGLOBIN: 14.9 g/dL (ref 13.0–17.0)
MCHC: 34.6 g/dL (ref 30.0–36.0)
MCV: 85.9 fl (ref 78.0–100.0)
PLATELETS: 219 10*3/uL (ref 150.0–400.0)
RBC: 5.02 Mil/uL (ref 4.22–5.81)
RDW: 12.9 % (ref 11.5–15.5)
WBC: 8.2 10*3/uL (ref 4.0–10.5)

## 2018-02-18 LAB — HEMOGLOBIN A1C: Hgb A1c MFr Bld: 6.1 % (ref 4.6–6.5)

## 2018-02-18 LAB — TSH: TSH: 2.69 u[IU]/mL (ref 0.35–4.50)

## 2018-02-18 LAB — LIPID PANEL
Cholesterol: 116 mg/dL (ref 0–200)
HDL: 40.3 mg/dL (ref >39.00)
LDL Cholesterol: 40 mg/dL (ref 0–99)
NONHDL: 75.75
TRIGLYCERIDES: 177 mg/dL — AB (ref 0.0–149.0)
Total CHOL/HDL Ratio: 3
VLDL: 35.4 mg/dL (ref 0.0–40.0)

## 2018-02-18 LAB — VITAMIN D 25 HYDROXY (VIT D DEFICIENCY, FRACTURES): VITD: 31.72 ng/mL (ref 30.00–100.00)

## 2018-02-18 LAB — TESTOSTERONE: TESTOSTERONE: 165.36 ng/dL — AB (ref 300.00–890.00)

## 2018-02-18 NOTE — Assessment & Plan Note (Signed)
Does well with pain meds prn and staying active. uds and contract UTD

## 2018-02-18 NOTE — Progress Notes (Signed)
Patient ID: Elijah Marquez, male   DOB: 1971-03-23, 47 y.o.   MRN: [id number]   Subjective:    Patient ID: Elijah Marquez, male    DOB: 1971-07-22, 47 y.o.   MRN: [id number]  No chief complaint on file.   HPI Patient is in today for follow up. He reports he is generally doing well. He continues to struggle with daily pain in his neck and left arm but he is able to work and maintain his ADLs with intermittent pain med use. No recent febrile illness or hospitalizations. He has had an increased amount of stress and anxiety lately. He uses Alprazolam infrequently with good results. Denies CP/palp/SOB/HA/congestion/fevers/GI or GU c/o. Taking meds as prescribed  Past Medical History:  Diagnosis Date  . Anxiety 08/23/2012  . Anxiety as acute reaction to exceptional stress 09/20/2012  . Chicken pox as a child  . Chronic lower back pain   . Fatigue   . History of tobacco abuse    Failed Chantix and wellbutrin   . Hyperlipidemia 08/23/2012  . Hypogonadism in male 10/02/2014  . Intermittent claudication (HCC) 08/13/2015   B/l legs  . Neck pain on left side 08/23/2012  . Osteoarthritis    "all over" (07/02/2017)  . Osteoarthritis of both knees   . Tobacco abuse     Past Surgical History:  Procedure Laterality Date  . ANTERIOR CERVICAL DECOMP/DISCECTOMY FUSION  12/08/2012   C4,5,,6  . BACK SURGERY    . CORONARY ANGIOPLASTY WITH STENT PLACEMENT  07/02/2017   "1 stent"  . CORONARY STENT INTERVENTION N/A 07/02/2017   Procedure: CORONARY STENT INTERVENTION;  Surgeon: Troy Sine, MD;  Location: [facility];  Service: Cardiovascular;  Laterality: N/A;  LAD/DIAG  . KNEE ARTHROSCOPY Bilateral 2011-2012   left-right  . LEFT HEART CATH AND CORONARY ANGIOGRAPHY N/A 07/02/2017   Procedure: Left Heart Cath and Coronary Angiography;  Surgeon: Troy Sine, MD;  Location: [facility];  Service: Cardiovascular;  Laterality: N/A;  . MULTIPLE TOOTH EXTRACTIONS    . WISDOM TOOTH EXTRACTION        Family History  Problem Relation Age of Onset  . Fibromyalgia Mother   . Coronary artery disease Mother   . Hypertension Mother   . Heart attack Mother   . Heart disease Mother        CAD  . Peripheral vascular disease Mother 57       hemorrhage from vascular procedure  . Fibromyalgia Sister   . Hypertension Sister   . Heart disease Sister   . Other Son        mildly mentally handicap  . Seizures Son   . Lupus Sister   . Emphysema Sister        smoker  . Kidney disease Daughter   . Alcohol abuse Father     Social History   Socioeconomic History  . Marital status: Married    Spouse name: Not on file  . Number of children: Not on file  . Years of education: Not on file  . Highest education level: Not on file  Social Needs  . Financial resource strain: Not on file  . Food insecurity - worry: Not on file  . Food insecurity - inability: Not on file  . Transportation needs - medical: Not on file  . Transportation needs - non-medical: Not on file  Occupational History  . Not on file  Tobacco Use  . Smoking status: Former Smoker    Packs/day:  1.50    Years: 35.00    Pack years: 52.50    Types: Cigarettes    Last attempt to quit: 07/31/2015    Years since quitting: 2.5  . Smokeless tobacco: Never Used  Substance and Sexual Activity  . Alcohol use: No  . Drug use: No  . Sexual activity: Not Currently    Partners: Female  Other Topics Concern  . Not on file  Social History Narrative  . Not on file    Outpatient Medications Prior to Visit  Medication Sig Dispense Refill  . aspirin 81 MG tablet Take 1 tablet (81 mg total) by mouth daily. 30 tablet   . clopidogrel (PLAVIX) 75 MG tablet Take 1 tablet (75 mg total) by mouth daily. Take 2 tabs = 150 mg the first 2 days 90 tablet 3  . HYDROcodone-acetaminophen (NORCO) 10-325 MG tablet Take 1 tablet by mouth every 6 (six) hours as needed for moderate pain or severe pain. April 2019 120 tablet 0  .  HYDROcodone-acetaminophen (NORCO) 10-325 MG tablet Take 1 tablet by mouth every 6 (six) hours as needed. March 2019 120 tablet 0  . HYDROcodone-acetaminophen (NORCO) 10-325 MG tablet Take 1 tablet by mouth every 6 (six) hours as needed. February 2019 120 tablet 0  . metoprolol tartrate (LOPRESSOR) 25 MG tablet Take 1 tablet (25 mg total) by mouth 2 (two) times daily. 180 tablet 2  . nitroGLYCERIN (NITROSTAT) 0.4 MG SL tablet Place 1 tablet (0.4 mg total) under the tongue every 5 (five) minutes as needed for chest pain. To ER if no resolution 25 tablet 3  . rosuvastatin (CRESTOR) 40 MG tablet daily.  3  . testosterone (ANDROGEL) 50 MG/5GM (1%) GEL 8 pumps daily 2 Package 0  . Vitamin D, Ergocalciferol, (DRISDOL) 50000 units CAPS capsule Take 1 capsule (50,000 Units total) by mouth every 7 (seven) days. 4 capsule 4  . ALPRAZolam (XANAX) 1 MG tablet Take 1 tablet (1 mg total) by mouth 2 (two) times daily as needed for sleep or anxiety. 60 tablet 2   No facility-administered medications prior to visit.     Allergies  Allergen Reactions  . Atorvastatin Other (See Comments)    Caused leg pain  . Penicillins Swelling and Rash    Has patient had a PCN reaction causing immediate rash, facial/tongue/throat swelling, SOB or lightheadedness with hypotension: Yes Has patient had a PCN reaction causing severe rash involving mucus membranes or skin necrosis: No Has patient had a PCN reaction that required hospitalization: Yes Has patient had a PCN reaction occurring within the last 10 years: No If all of the above answers are "NO", then may proceed with Cephalosporin use.     Review of Systems  Constitutional: Negative for fever and malaise/fatigue.  HENT: Negative for congestion.   Eyes: Negative for blurred vision.  Respiratory: Negative for shortness of breath.   Cardiovascular: Negative for chest pain, palpitations and leg swelling.  Gastrointestinal: Negative for abdominal pain, blood in stool  and nausea.  Genitourinary: Negative for dysuria and frequency.  Musculoskeletal: Positive for joint pain, myalgias and neck pain. Negative for falls.  Skin: Negative for rash.  Neurological: Negative for dizziness, loss of consciousness and headaches.  Endo/Heme/Allergies: Negative for environmental allergies.  Psychiatric/Behavioral: Negative for depression. The patient is not nervous/anxious.        Objective:    Physical Exam  Constitutional: He is oriented to person, place, and time. He appears well-developed and well-nourished. No distress.  HENT:  Head:  Normocephalic and atraumatic.  Nose: Nose normal.  Eyes: Right eye exhibits no discharge. Left eye exhibits no discharge.  Neck: Normal range of motion. Neck supple.  Cardiovascular: Normal rate and regular rhythm.  No murmur heard. Pulmonary/Chest: Effort normal and breath sounds normal.  Abdominal: Soft. Bowel sounds are normal. There is no tenderness.  Musculoskeletal: He exhibits no edema.  Neurological: He is alert and oriented to person, place, and time.  Skin: Skin is warm and dry.  Psychiatric: He has a normal mood and affect.  Nursing note and vitals reviewed.   BP 100/60 (BP Location: Left Arm, Patient Position: Sitting, Cuff Size: Normal)   Pulse (!) 57   Temp (!) 97.1 F (36.2 C) (Oral)   Resp 18   Wt 185 lb 12.8 oz (84.3 kg)   SpO2 96%   BMI 27.44 kg/m  Wt Readings from Last 3 Encounters:  02/17/18 185 lb 12.8 oz (84.3 kg)  10/21/17 191 lb 12.8 oz (87 kg)  10/17/17 190 lb (86.2 kg)     Lab Results  Component Value Date   WBC 8.2 02/17/2018   HGB 14.9 02/17/2018   HCT 43.1 02/17/2018   PLT 219.0 02/17/2018   GLUCOSE 87 02/17/2018   CHOL 116 02/17/2018   TRIG 177.0 (H) 02/17/2018   HDL 40.30 02/17/2018   LDLDIRECT 77.0 06/17/2017   LDLCALC 40 02/17/2018   ALT 38 02/17/2018   AST 31 02/17/2018   NA 144 02/17/2018   K 4.0 02/17/2018   CL 102 02/17/2018   CREATININE 0.88 02/17/2018   BUN  10 02/17/2018   CO2 29 02/17/2018   TSH 2.69 02/17/2018   INR 0.97 07/02/2017   HGBA1C 6.1 02/17/2018    Lab Results  Component Value Date   TSH 2.69 02/17/2018   Lab Results  Component Value Date   WBC 8.2 02/17/2018   HGB 14.9 02/17/2018   HCT 43.1 02/17/2018   MCV 85.9 02/17/2018   PLT 219.0 02/17/2018   Lab Results  Component Value Date   NA 144 02/17/2018   K 4.0 02/17/2018   CO2 29 02/17/2018   GLUCOSE 87 02/17/2018   BUN 10 02/17/2018   CREATININE 0.88 02/17/2018   BILITOT 0.2 02/17/2018   ALKPHOS 75 02/17/2018   AST 31 02/17/2018   ALT 38 02/17/2018   PROT 7.9 02/17/2018   ALBUMIN 5.1 02/17/2018   CALCIUM 10.5 02/17/2018   ANIONGAP 7 07/03/2017   GFR 97.91 02/17/2018   Lab Results  Component Value Date   CHOL 116 02/17/2018   Lab Results  Component Value Date   HDL 40.30 02/17/2018   Lab Results  Component Value Date   LDLCALC 40 02/17/2018   Lab Results  Component Value Date   TRIG 177.0 (H) 02/17/2018   Lab Results  Component Value Date   CHOLHDL 3 02/17/2018   Lab Results  Component Value Date   HGBA1C 6.1 02/17/2018       Assessment & Plan:   Problem List Items Addressed This Visit    Neck pain on left side    Does well with pain meds prn and staying active. uds and contract UTD      Hyperlipidemia    Encouraged heart healthy diet, increase exercise, avoid trans fats, consider a krill oil cap daily      Relevant Orders   Lipid panel (Completed)   Anxiety as acute reaction to exceptional stress    d      Relevant Medications  ALPRAZolam (XANAX) 1 MG tablet   Essential hypertension    Well controlled, no changes to meds. Encouraged heart healthy diet such as the DASH diet and exercise as tolerated.       Relevant Orders   CBC (Completed)   Comprehensive metabolic panel (Completed)   TSH (Completed)   Hyperglycemia    hgba1c acceptable, minimize simple carbs. Increase exercise as tolerated. Continue current meds       Relevant Orders   Hemoglobin A1c (Completed)   VITAMIN D 25 Hydroxy (Vit-D Deficiency, Fractures) (Completed)    Other Visit Diagnoses    Low testosterone    -  Primary   Relevant Orders   Testosterone (Completed)   Anxiety and depression       Relevant Medications   ALPRAZolam (XANAX) 1 MG tablet      I am having [name] maintain his aspirin, nitroGLYCERIN, clopidogrel, rosuvastatin, Vitamin D (Ergocalciferol), testosterone, HYDROcodone-acetaminophen, HYDROcodone-acetaminophen, HYDROcodone-acetaminophen, metoprolol tartrate, and ALPRAZolam.  Meds ordered this encounter  Medications  . ALPRAZolam (XANAX) 1 MG tablet    Sig: Take 1 tablet (1 mg total) by mouth 2 (two) times daily as needed for sleep or anxiety.    Dispense:  60 tablet    Refill:  2    CMA served as scribe during this visit. History, Physical and Plan performed by medical provider. Documentation and orders reviewed and attested to.  Penni Homans, MD

## 2018-02-20 MED FILL — HYDROCODON-APAP 10-325: 10-325 | 30 days supply | Qty: 120 | Fill #0

## 2018-03-03 ENCOUNTER — Other Ambulatory Visit: Payer: Self-pay | Admitting: Cardiology

## 2018-03-03 MED FILL — ROSUVASTATIN CALCIUM 40 MG: 40 | 30 days supply | Qty: 30 | Fill #0

## 2018-03-19 MED FILL — CLOPIDOGREL 75 MG TABLET: 75 | 30 days supply | Qty: 30 | Fill #7

## 2018-03-23 MED FILL — METOPROLOL TARTRATE 25 MG T: 25 | 30 days supply | Qty: 60 | Fill #8

## 2018-03-23 MED FILL — HYDROCODON-APAP 10-325: 10-325 | 30 days supply | Qty: 120 | Fill #0

## 2018-04-03 MED FILL — ROSUVASTATIN CALCIUM 40 MG: 40 | 30 days supply | Qty: 30 | Fill #1

## 2018-04-17 MED FILL — CLOPIDOGREL 75 MG TABLET: 75 | 30 days supply | Qty: 30 | Fill #8

## 2018-04-21 ENCOUNTER — Telehealth: Payer: Self-pay | Admitting: Family Medicine

## 2018-04-21 ENCOUNTER — Other Ambulatory Visit: Payer: Self-pay | Admitting: Family Medicine

## 2018-04-21 MED FILL — HYDROCODON-APAP 10-325: 10-325 | 30 days supply | Qty: 120 | Fill #0

## 2018-04-21 NOTE — Telephone Encounter (Signed)
Requesting: Norco 10-325mg  po q 6 hrs prn Contract: yes UDS: 01/23/18 Last OV: 02/17/18 Next Ov: 08/25/18 Last refill: 01/22/18, 120# X3 (one designated for February, one for March, one for April), no refills Database: Overdose risk score of 90  Please advise.

## 2018-04-21 NOTE — Telephone Encounter (Signed)
Refill for control med, Hydrocodone-acetaminophen 10-325 LR  In April 2019 for #120 tabs  Dr. Randel Pigg  LOV  02/17/18 NOV 08/25/18  [place]

## 2018-04-21 NOTE — Telephone Encounter (Signed)
Copied from [place] [id number]. Topic: Quick Communication - Rx Refill/Question >> Apr 21, 2018  8:15 AM Yvette Rack wrote: Medication: HYDROcodone-acetaminophen (NORCO) 10-325 MG tablet  Has the patient contacted their pharmacy? Yes.   (Agent: If no, request that the patient contact the pharmacy for the refill.) (Agent: If yes, when and what did the pharmacy advise?)  Preferred Pharmacy (with phone number or street name):   [place], [place] - [place] [phone] (Phone) [phone] (Fax)   Agent: Please be advised that RX refills may take up to 3 business days. We ask that you follow-up with your pharmacy.

## 2018-05-04 MED FILL — METOPROLOL TARTRATE 25 MG T: 25 | 30 days supply | Qty: 60 | Fill #9

## 2018-05-04 MED FILL — ROSUVASTATIN CALCIUM 40 MG: 40 | 30 days supply | Qty: 30 | Fill #2

## 2018-05-20 ENCOUNTER — Other Ambulatory Visit: Payer: Self-pay | Admitting: Family Medicine

## 2018-05-20 MED FILL — CLOPIDOGREL 75 MG TABLET: 75 | 30 days supply | Qty: 30 | Fill #9

## 2018-05-21 ENCOUNTER — Other Ambulatory Visit: Payer: Self-pay | Admitting: Family Medicine

## 2018-05-21 ENCOUNTER — Telehealth: Payer: Self-pay

## 2018-05-21 DIAGNOSIS — E782 Mixed hyperlipidemia: Secondary | ICD-10-CM

## 2018-05-21 DIAGNOSIS — Z720 Tobacco use: Secondary | ICD-10-CM

## 2018-05-21 MED ORDER — TESTOSTERONE 50 MG/5GM (1%) TD GEL: TRANSDERMAL | 0 refills | Status: DC

## 2018-05-21 MED ORDER — HYDROCODONE-ACETAMINOPHEN 10-325 MG PO TABS
1.0000 | ORAL_TABLET | Freq: Four times a day (QID) | ORAL | 0 refills | Status: AC | PRN
Start: 2018-05-21 — End: 2018-05-28

## 2018-05-21 MED FILL — TESTOSTERONE 12.5 MG/1.25 G: 12.5 MG/ACT | 30 days supply | Qty: 300 | Fill #0

## 2018-05-21 MED FILL — HYDROCODON-APAP 10-325: 10-325 | 30 days supply | Qty: 120 | Fill #0

## 2018-05-21 NOTE — Telephone Encounter (Signed)
Requesting: hydrocodone-acetaminophen 10-325mg , 1 tab every 6hr prn Contract: 01/23/18 UDS: 01/23/18 Last OV: 02/17/18 Next OV: 08/25/18 Last refill: 04/21/18, 120tabs, 0RF Database: record not available on database  Pt. Also requesting testosterone 1% gel refill, 8 pumps daily, last refilled 12/30/17, #2 packages. Please advise.

## 2018-05-21 NOTE — Telephone Encounter (Signed)
PA initiated via Covermymeds; KEY: [id number]. Received real-time PA approval.   Effective 04/21/2018 to 05/21/2019.

## 2018-05-21 NOTE — Telephone Encounter (Signed)
Requesting: NORCO Contract:01/23/18 UDS:01/23/18 Low risk Last OV:02/17/18 Next OV:08/25/18 Last Refill:04/21/18   Please advise

## 2018-05-26 MED FILL — ALPRAZolam 1 MG TABS: 1 | 30 days supply | Qty: 60 | Fill #1

## 2018-05-29 MED FILL — ROSUVASTATIN CALCIUM 40 MG: 40 | 30 days supply | Qty: 30 | Fill #3

## 2018-06-12 ENCOUNTER — Other Ambulatory Visit: Payer: Self-pay | Admitting: Cardiology

## 2018-06-12 MED ORDER — METOPROLOL TARTRATE 25 MG PO TABS: 25.0000 mg | ORAL_TABLET | Freq: Two times a day (BID) | ORAL | 0 refills | Status: DC

## 2018-06-12 MED FILL — METOPROLOL TARTRATE 25 MG T: 25 | 30 days supply | Qty: 60 | Fill #0

## 2018-06-19 MED FILL — HYDROCODON-APAP 10-325: 10-325 | 30 days supply | Qty: 120 | Fill #0

## 2018-06-19 MED FILL — CLOPIDOGREL 75 MG TABLET: 75 | 30 days supply | Qty: 30 | Fill #10

## 2018-07-03 MED FILL — ROSUVASTATIN CALCIUM 40 MG: 40 | 30 days supply | Qty: 30 | Fill #4

## 2018-07-20 ENCOUNTER — Other Ambulatory Visit: Payer: Self-pay | Admitting: Family Medicine

## 2018-07-20 ENCOUNTER — Other Ambulatory Visit: Payer: Self-pay | Admitting: Cardiology

## 2018-07-20 DIAGNOSIS — I25119 Atherosclerotic heart disease of native coronary artery with unspecified angina pectoris: Secondary | ICD-10-CM

## 2018-07-20 MED ORDER — HYDROCODONE-ACETAMINOPHEN 10-325 MG PO TABS: 1.0000 | ORAL_TABLET | Freq: Four times a day (QID) | ORAL | 0 refills | Status: DC | PRN

## 2018-07-20 MED FILL — CLOPIDOGREL 75 MG TABLET: 75 | 30 days supply | Qty: 30 | Fill #0

## 2018-07-20 MED FILL — HYDROCODON-APAP 10-325: 10-325 | 30 days supply | Qty: 120 | Fill #0

## 2018-07-20 NOTE — Telephone Encounter (Signed)
Refill request for hydrocodone.   Last OV: 02/17/2018 Last Fill: 05/21/2018 #120 and 0RF UDS: 01/27/2018 Low risk

## 2018-07-30 MED FILL — METOPROLOL TARTRATE 25 MG T: 25 | 30 days supply | Qty: 60 | Fill #1

## 2018-08-04 MED FILL — ROSUVASTATIN CALCIUM 40 MG: 40 | 30 days supply | Qty: 30 | Fill #5

## 2018-08-19 ENCOUNTER — Other Ambulatory Visit: Payer: Self-pay | Admitting: Family Medicine

## 2018-08-20 MED ORDER — HYDROCODONE-ACETAMINOPHEN 10-325 MG PO TABS: 1.0000 | ORAL_TABLET | Freq: Four times a day (QID) | ORAL | 0 refills | Status: DC | PRN

## 2018-08-20 MED FILL — CLOPIDOGREL 75 MG TABLET: 75 | 30 days supply | Qty: 30 | Fill #1

## 2018-08-20 MED FILL — HYDROCODON-APAP 10-325: 10-325 | 30 days supply | Qty: 120 | Fill #0

## 2018-08-20 NOTE — Telephone Encounter (Signed)
Requesting:norco Contract:yes UDS:low risk next screen 07/27/18 Last OV:02/17/18 Next OV:08/25/18 Last Refill:07/20/18 #120-0rf Database:   Please advise

## 2018-08-25 ENCOUNTER — Ambulatory Visit (INDEPENDENT_AMBULATORY_CARE_PROVIDER_SITE_OTHER): Payer: BLUE CROSS/BLUE SHIELD | Admitting: Family Medicine

## 2018-08-25 VITALS — BP 110/62 | HR 54 | Temp 98.3°F | Resp 18 | Ht 69.0 in | Wt 189.0 lb

## 2018-08-25 DIAGNOSIS — Z79899 Other long term (current) drug therapy: Secondary | ICD-10-CM | POA: Diagnosis not present

## 2018-08-25 DIAGNOSIS — I10 Essential (primary) hypertension: Secondary | ICD-10-CM | POA: Diagnosis not present

## 2018-08-25 DIAGNOSIS — R739 Hyperglycemia, unspecified: Secondary | ICD-10-CM | POA: Diagnosis not present

## 2018-08-25 DIAGNOSIS — F419 Anxiety disorder, unspecified: Secondary | ICD-10-CM

## 2018-08-25 DIAGNOSIS — Z0000 Encounter for general adult medical examination without abnormal findings: Secondary | ICD-10-CM

## 2018-08-25 DIAGNOSIS — F329 Major depressive disorder, single episode, unspecified: Secondary | ICD-10-CM

## 2018-08-25 DIAGNOSIS — G4700 Insomnia, unspecified: Secondary | ICD-10-CM | POA: Diagnosis not present

## 2018-08-25 DIAGNOSIS — E782 Mixed hyperlipidemia: Secondary | ICD-10-CM | POA: Diagnosis not present

## 2018-08-25 DIAGNOSIS — E291 Testicular hypofunction: Secondary | ICD-10-CM

## 2018-08-25 DIAGNOSIS — Z955 Presence of coronary angioplasty implant and graft: Secondary | ICD-10-CM

## 2018-08-25 DIAGNOSIS — Z87891 Personal history of nicotine dependence: Secondary | ICD-10-CM

## 2018-08-25 DIAGNOSIS — M542 Cervicalgia: Secondary | ICD-10-CM

## 2018-08-25 DIAGNOSIS — F32A Depression, unspecified: Secondary | ICD-10-CM

## 2018-08-25 MED ORDER — ALPRAZOLAM 1 MG PO TABS: 1.0000 mg | ORAL_TABLET | Freq: Two times a day (BID) | ORAL | 2 refills | Status: DC | PRN

## 2018-08-25 MED FILL — ALPRAZolam 1 MG TABS: 1 | 30 days supply | Qty: 60 | Fill #0

## 2018-08-25 NOTE — Assessment & Plan Note (Signed)
Well controlled, no changes to meds. Encouraged heart healthy diet such as the DASH diet and exercise as tolerated.  °

## 2018-08-25 NOTE — Assessment & Plan Note (Addendum)
hgba1c acceptable, minimize simple carbs. Increase exercise as tolerated.  

## 2018-08-25 NOTE — Progress Notes (Signed)
Error

## 2018-08-25 NOTE — Patient Instructions (Signed)

## 2018-08-25 NOTE — Assessment & Plan Note (Signed)
Doing much better no concerning symptoms

## 2018-08-25 NOTE — Assessment & Plan Note (Signed)
Check levels 

## 2018-08-25 NOTE — Assessment & Plan Note (Signed)
Encouraged heart healthy diet, increase exercise, avoid trans fats, consider a krill oil cap daily 

## 2018-08-25 NOTE — Assessment & Plan Note (Addendum)
Encouraged good sleep hygiene such as dark, quiet room. No blue/green glowing lights such as computer screens in bedroom. No alcohol or stimulants in evening. Cut down on caffeine as able. Regular exercise is helpful but not just prior to bed time. Has been having more trouble with sleep and has been taking Alprazolam to sleep. He drinks a full liter of pepsi daily and is encouraged to stop and his sleep should get better.

## 2018-08-25 NOTE — Assessment & Plan Note (Signed)
Patient encouraged to maintain heart healthy diet, regular exercise, adequate sleep. Consider daily probiotics. Take medications as prescribed. Given and reviewed copy of ACP documents from [place] Secretary of State and encouraged to complete and return 

## 2018-08-26 LAB — CBC
HEMATOCRIT: 42 % (ref 39.0–52.0)
HEMOGLOBIN: 14.5 g/dL (ref 13.0–17.0)
MCHC: 34.5 g/dL (ref 30.0–36.0)
MCV: 85.2 fl (ref 78.0–100.0)
Platelets: 203 10*3/uL (ref 150.0–400.0)
RBC: 4.92 Mil/uL (ref 4.22–5.81)
RDW: 13 % (ref 11.5–15.5)
WBC: 9.1 10*3/uL (ref 4.0–10.5)

## 2018-08-26 LAB — MICROALBUMIN / CREATININE URINE RATIO
Creatinine,U: 108.1 mg/dL
Microalb Creat Ratio: 0.6 mg/g (ref 0.0–30.0)
Microalb, Ur: 0.7 mg/dL (ref 0.0–1.9)

## 2018-08-26 LAB — LIPID PANEL
CHOLESTEROL: 112 mg/dL (ref 0–200)
HDL: 40 mg/dL (ref >39.00)
LDL CALC: 43 mg/dL (ref 0–99)
NonHDL: 72.36
TRIGLYCERIDES: 148 mg/dL (ref 0.0–149.0)
Total CHOL/HDL Ratio: 3
VLDL: 29.6 mg/dL (ref 0.0–40.0)

## 2018-08-26 LAB — COMPREHENSIVE METABOLIC PANEL
ALT: 35 U/L (ref 0–53)
AST: 27 U/L (ref 0–37)
Albumin: 4.8 g/dL (ref 3.5–5.2)
Alkaline Phosphatase: 77 U/L (ref 39–117)
BUN: 9 mg/dL (ref 6–23)
CO2: 33 meq/L — AB (ref 19–32)
Calcium: 9.8 mg/dL (ref 8.4–10.5)
Chloride: 99 mEq/L (ref 96–112)
Creatinine, Ser: 0.96 mg/dL (ref 0.40–1.50)
GFR: 88.37 mL/min (ref >60.00)
GLUCOSE: 81 mg/dL (ref 70–99)
POTASSIUM: 3.8 meq/L (ref 3.5–5.1)
SODIUM: 140 meq/L (ref 135–145)
Total Bilirubin: 0.5 mg/dL (ref 0.2–1.2)
Total Protein: 7.6 g/dL (ref 6.0–8.3)

## 2018-08-26 LAB — TESTOSTERONE: Testosterone: 186.27 ng/dL — ABNORMAL LOW (ref 300.00–890.00)

## 2018-08-26 LAB — TSH: TSH: 4.17 u[IU]/mL (ref 0.35–4.50)

## 2018-08-26 LAB — HEMOGLOBIN A1C: Hgb A1c MFr Bld: 6.2 % (ref 4.6–6.5)

## 2018-08-28 LAB — PAIN MGMT, PROFILE 8 W/CONF, U
6 Acetylmorphine: NEGATIVE ng/mL (ref <10)
ALPHAHYDROXYMIDAZOLAM: NEGATIVE ng/mL (ref <50)
ALPHAHYDROXYTRIAZOLAM: NEGATIVE ng/mL (ref <50)
Alcohol Metabolites: NEGATIVE ng/mL (ref <500)
Alphahydroxyalprazolam: 145 ng/mL — ABNORMAL HIGH (ref <25)
Aminoclonazepam: NEGATIVE ng/mL (ref <25)
Amphetamines: NEGATIVE ng/mL (ref <500)
BENZODIAZEPINES: POSITIVE ng/mL — AB (ref <100)
Buprenorphine, Urine: NEGATIVE ng/mL (ref <5)
CODEINE: NEGATIVE ng/mL (ref <50)
Cocaine Metabolite: NEGATIVE ng/mL (ref <150)
Creatinine: 102.5 mg/dL
HYDROCODONE: 2521 ng/mL — AB (ref <50)
HYDROXYETHYLFLURAZEPAM: NEGATIVE ng/mL (ref <50)
Hydromorphone: 389 ng/mL — ABNORMAL HIGH (ref <50)
Lorazepam: NEGATIVE ng/mL (ref <50)
MARIJUANA METABOLITE: NEGATIVE ng/mL (ref <20)
MDMA: NEGATIVE ng/mL (ref <500)
Morphine: NEGATIVE ng/mL (ref <50)
Nordiazepam: NEGATIVE ng/mL (ref <50)
Norhydrocodone: 2891 ng/mL — ABNORMAL HIGH (ref <50)
OXAZEPAM: NEGATIVE ng/mL (ref <50)
OXYCODONE: NEGATIVE ng/mL (ref <100)
Opiates: POSITIVE ng/mL — AB (ref <100)
Oxidant: NEGATIVE ug/mL (ref <200)
Temazepam: NEGATIVE ng/mL (ref <50)
pH: 5.75 (ref 4.5–9.0)

## 2018-08-30 NOTE — Progress Notes (Signed)
Subjective:    Patient ID: Elijah Marquez, male    DOB: [birth year], 47 y.o.   MRN: [id number]  No chief complaint on file.   HPI Patient is in today for annual preventative exam and follow up on his chronic medical concerns including neck and shoulder pain from old injury, hyperlipidemia, hypertension and more. No polyuria or polydipsia. No recent febrile illness or hospitalizations. He continues to vap daily and is thinking about trying to quit. He is having trouble sleeping and he has used more Alprazolam to help with this which it does. No trouble with activities of daily living and he continues to work full time. Stays active and ties to maintain a heart healthy diet. Denies CP/palp/SOB/HA/congestion/fevers/GI or GU c/o. Taking meds as prescribed  Past Medical History:  Diagnosis Date  . Anxiety 08/23/2012  . Anxiety as acute reaction to exceptional stress 09/20/2012  . Chicken pox as a child  . Chronic lower back pain   . Fatigue   . History of tobacco abuse    Failed Chantix and wellbutrin   . Hyperlipidemia 08/23/2012  . Hypogonadism in male 10/02/2014  . Intermittent claudication (HCC) 08/13/2015   B/l legs  . Neck pain on left side 08/23/2012  . Osteoarthritis    "all over" (07/02/2017)  . Osteoarthritis of both knees   . Tobacco abuse     Past Surgical History:  Procedure Laterality Date  . ANTERIOR CERVICAL DECOMP/DISCECTOMY FUSION  12/08/2012   C4,5,,6  . BACK SURGERY    . CORONARY ANGIOPLASTY WITH STENT PLACEMENT  07/02/2017   "1 stent"  . CORONARY STENT INTERVENTION N/A 07/02/2017   Procedure: CORONARY STENT INTERVENTION;  Surgeon: Troy Sine, MD;  Location: [facility];  Service: Cardiovascular;  Laterality: N/A;  LAD/DIAG  . KNEE ARTHROSCOPY Bilateral 2011-2012   left-right  . LEFT HEART CATH AND CORONARY ANGIOGRAPHY N/A 07/02/2017   Procedure: Left Heart Cath and Coronary Angiography;  Surgeon: Troy Sine, MD;  Location: [facility];  Service:  Cardiovascular;  Laterality: N/A;  . MULTIPLE TOOTH EXTRACTIONS    . WISDOM TOOTH EXTRACTION      Family History  Problem Relation Age of Onset  . Fibromyalgia Mother   . Coronary artery disease Mother   . Hypertension Mother   . Heart attack Mother   . Heart disease Mother        CAD  . Peripheral vascular disease Mother 41       hemorrhage from vascular procedure  . Fibromyalgia Sister   . Hypertension Sister   . Heart disease Sister   . Other Son        mildly mentally handicap  . Seizures Son   . Lupus Sister   . Emphysema Sister        smoker  . Kidney disease Daughter   . Alcohol abuse Father     Social History   Socioeconomic History  . Marital status: Married    Spouse name: Not on file  . Number of children: Not on file  . Years of education: Not on file  . Highest education level: Not on file  Occupational History  . Not on file  Social Needs  . Financial resource strain: Not on file  . Food insecurity:    Worry: Not on file    Inability: Not on file  . Transportation needs:    Medical: Not on file    Non-medical: Not on file  Tobacco Use  .  Smoking status: Former Smoker    Packs/day: 1.50    Years: 35.00    Pack years: 52.50    Types: Cigarettes    Last attempt to quit: 07/31/2015    Years since quitting: 3.0  . Smokeless tobacco: Never Used  Substance and Sexual Activity  . Alcohol use: No  . Drug use: No  . Sexual activity: Not Currently    Partners: Female  Lifestyle  . Physical activity:    Days per week: Not on file    Minutes per session: Not on file  . Stress: Not on file  Relationships  . Social connections:    Talks on phone: Not on file    Gets together: Not on file    Attends religious service: Not on file    Active member of club or organization: Not on file    Attends meetings of clubs or organizations: Not on file    Relationship status: Not on file  . Intimate partner violence:    Fear of current or ex partner: Not on  file    Emotionally abused: Not on file    Physically abused: Not on file    Forced sexual activity: Not on file  Other Topics Concern  . Not on file  Social History Narrative  . Not on file    Outpatient Medications Prior to Visit  Medication Sig Dispense Refill  . aspirin 81 MG tablet Take 1 tablet (81 mg total) by mouth daily. 30 tablet   . clopidogrel (PLAVIX) 75 MG tablet TAKE 1 TABLET (75 MG TOTAL) BY MOUTH DAILY. 90 tablet 0  . HYDROcodone-acetaminophen (NORCO) 10-325 MG tablet Take 1 tablet by mouth every 6 (six) hours as needed. 120 tablet 0  . metoprolol tartrate (LOPRESSOR) 25 MG tablet Take 1 tablet (25 mg total) by mouth 2 (two) times daily. 180 tablet 0  . nitroGLYCERIN (NITROSTAT) 0.4 MG SL tablet Place 1 tablet (0.4 mg total) under the tongue every 5 (five) minutes as needed for chest pain. To ER if no resolution 25 tablet 3  . rosuvastatin (CRESTOR) 40 MG tablet daily.  3  . rosuvastatin (CRESTOR) 40 MG tablet TAKE 1 TABLET BY MOUTH DAILY 90 tablet 2  . testosterone (ANDROGEL) 50 MG/5GM (1%) GEL 8 pumps daily 2 Package 0  . Testosterone 12.5 MG/ACT (1%) GEL APPLY 8 PUMPS TO THE SKIN DAILY 300 g 0  . Vitamin D, Ergocalciferol, (DRISDOL) 50000 units CAPS capsule Take 1 capsule (50,000 Units total) by mouth every 7 (seven) days. 4 capsule 4  . ALPRAZolam (XANAX) 1 MG tablet Take 1 tablet (1 mg total) by mouth 2 (two) times daily as needed for sleep or anxiety. 60 tablet 2   No facility-administered medications prior to visit.     Allergies  Allergen Reactions  . Atorvastatin Other (See Comments)    Caused leg pain  . Penicillins Swelling and Rash    Has patient had a PCN reaction causing immediate rash, facial/tongue/throat swelling, SOB or lightheadedness with hypotension: Yes Has patient had a PCN reaction causing severe rash involving mucus membranes or skin necrosis: No Has patient had a PCN reaction that required hospitalization: Yes Has patient had a PCN  reaction occurring within the last 10 years: No If all of the above answers are "NO", then may proceed with Cephalosporin use.     Review of Systems  Constitutional: Positive for malaise/fatigue. Negative for chills and fever.  HENT: Negative for congestion and hearing loss.  Eyes: Negative for discharge.  Respiratory: Negative for cough, sputum production and shortness of breath.   Cardiovascular: Negative for chest pain, palpitations and leg swelling.  Gastrointestinal: Negative for abdominal pain, blood in stool, constipation, diarrhea, heartburn, nausea and vomiting.  Genitourinary: Negative for dysuria, frequency, hematuria and urgency.  Musculoskeletal: Positive for back pain, joint pain and neck pain. Negative for falls and myalgias.  Skin: Negative for rash.  Neurological: Negative for dizziness, sensory change, loss of consciousness, weakness and headaches.  Endo/Heme/Allergies: Negative for environmental allergies. Does not bruise/bleed easily.  Psychiatric/Behavioral: Negative for depression and suicidal ideas. The patient is nervous/anxious and has insomnia.        Objective:    Physical Exam  Constitutional: He is oriented to person, place, and time. He appears well-developed and well-nourished. No distress.  HENT:  Head: Normocephalic and atraumatic.  Right Ear: External ear normal.  Left Ear: External ear normal.  Nose: Nose normal.  Mouth/Throat: Oropharynx is clear and moist.  Eyes: Pupils are equal, round, and reactive to light. Conjunctivae and EOM are normal. Right eye exhibits no discharge. Left eye exhibits no discharge.  Neck: Normal range of motion. Neck supple. No JVD present. No thyromegaly present.  Cardiovascular: Normal rate and regular rhythm. Exam reveals no friction rub.  No murmur heard. Pulmonary/Chest: Effort normal and breath sounds normal.  Abdominal: Soft. Bowel sounds are normal. There is no tenderness.  Musculoskeletal: He exhibits no  edema.  Neurological: He is alert and oriented to person, place, and time.  Skin: Skin is warm and dry.  Psychiatric: He has a normal mood and affect.  Nursing note and vitals reviewed.   BP 110/62 (BP Location: Left Arm, Patient Position: Sitting, Cuff Size: Normal)   Pulse (!) 54   Temp 98.3 F (36.8 C) (Oral)   Resp 18   Ht 5\' 9"  (1.753 m)   Wt 189 lb (85.7 kg)   SpO2 98%   BMI 27.91 kg/m  Wt Readings from Last 3 Encounters:  08/25/18 189 lb (85.7 kg)  02/17/18 185 lb 12.8 oz (84.3 kg)  10/21/17 191 lb 12.8 oz (87 kg)     Lab Results  Component Value Date   WBC 9.1 08/25/2018   HGB 14.5 08/25/2018   HCT 42.0 08/25/2018   PLT 203.0 08/25/2018   GLUCOSE 81 08/25/2018   CHOL 112 08/25/2018   TRIG 148.0 08/25/2018   HDL 40.00 08/25/2018   LDLDIRECT 77.0 06/17/2017   LDLCALC 43 08/25/2018   ALT 35 08/25/2018   AST 27 08/25/2018   NA 140 08/25/2018   K 3.8 08/25/2018   CL 99 08/25/2018   CREATININE 0.96 08/25/2018   BUN 9 08/25/2018   CO2 33 (H) 08/25/2018   TSH 4.17 08/25/2018   INR 0.97 07/02/2017   HGBA1C 6.2 08/25/2018   MICROALBUR 0.7 08/25/2018    Lab Results  Component Value Date   TSH 4.17 08/25/2018   Lab Results  Component Value Date   WBC 9.1 08/25/2018   HGB 14.5 08/25/2018   HCT 42.0 08/25/2018   MCV 85.2 08/25/2018   PLT 203.0 08/25/2018   Lab Results  Component Value Date   NA 140 08/25/2018   K 3.8 08/25/2018   CO2 33 (H) 08/25/2018   GLUCOSE 81 08/25/2018   BUN 9 08/25/2018   CREATININE 0.96 08/25/2018   BILITOT 0.5 08/25/2018   ALKPHOS 77 08/25/2018   AST 27 08/25/2018   ALT 35 08/25/2018   PROT 7.6 08/25/2018  ALBUMIN 4.8 08/25/2018   CALCIUM 9.8 08/25/2018   ANIONGAP 7 07/03/2017   GFR 88.37 08/25/2018   Lab Results  Component Value Date   CHOL 112 08/25/2018   Lab Results  Component Value Date   HDL 40.00 08/25/2018   Lab Results  Component Value Date   LDLCALC 43 08/25/2018   Lab Results  Component Value  Date   TRIG 148.0 08/25/2018   Lab Results  Component Value Date   CHOLHDL 3 08/25/2018   Lab Results  Component Value Date   HGBA1C 6.2 08/25/2018       Assessment & Plan:   Problem List Items Addressed This Visit    History of tobacco abuse    Quit smoking 3 years ago but unfortunately he has continued to vaped. He is warned about the dangers of vaping and he agrees to try and quit      Neck pain on left side    Encouraged moist heat and gentle stretching as tolerated. May try NSAIDs and prescription meds as directed and report if symptoms worsen or seek immediate care. May continue pain meds prn spaingly      Preventative health care    Patient encouraged to maintain heart healthy diet, regular exercise, adequate sleep. Consider daily probiotics. Take medications as prescribed. Given and reviewed copy of ACP documents from [employer] and encouraged to complete and return      Hyperlipidemia    Encouraged heart healthy diet, increase exercise, avoid trans fats, consider a krill oil cap daily      Relevant Orders   Lipid panel (Completed)   Hypogonadism in male    Check levels      Relevant Orders   Testosterone (Completed)   Essential hypertension    Well controlled, no changes to meds. Encouraged heart healthy diet such as the DASH diet and exercise as tolerated.       Relevant Orders   CBC (Completed)   Comprehensive metabolic panel (Completed)   TSH (Completed)   Urine Microalbumin w/creat. ratio (Completed)   Status post coronary artery stent placement    Doing much better no concerning symptoms      Hyperglycemia    hgba1c acceptable, minimize simple carbs. Increase exercise as tolerated      Relevant Orders   Hemoglobin A1c (Completed)   Insomnia    Encouraged good sleep hygiene such as dark, quiet room. No blue/green glowing lights such as computer screens in bedroom. No alcohol or stimulants in evening. Cut down on caffeine as able.  Regular exercise is helpful but not just prior to bed time. Has been having more trouble with sleep and has been taking Alprazolam to sleep. He drinks a full liter of pepsi daily and is encouraged to stop and his sleep should get better.       Other Visit Diagnoses    High risk medication use    -  Primary   Relevant Orders   Pain Mgmt, Profile 8 w/Conf, U (Completed)   Anxiety and depression       Relevant Medications   ALPRAZolam (XANAX) 1 MG tablet      I am having [name] maintain his aspirin, nitroGLYCERIN, rosuvastatin, Vitamin D (Ergocalciferol), rosuvastatin, Testosterone, testosterone, metoprolol tartrate, clopidogrel, HYDROcodone-acetaminophen, and ALPRAZolam.  Meds ordered this encounter  Medications  . ALPRAZolam (XANAX) 1 MG tablet    Sig: Take 1 tablet (1 mg total) by mouth 2 (two) times daily as needed for sleep or  anxiety.    Dispense:  60 tablet    Refill:  2     Penni Homans, MD

## 2018-08-30 NOTE — Assessment & Plan Note (Signed)
Quit smoking 3 years ago but unfortunately he has continued to vaped. He is warned about the dangers of vaping and he agrees to try and quit

## 2018-08-30 NOTE — Assessment & Plan Note (Signed)
Encouraged moist heat and gentle stretching as tolerated. May try NSAIDs and prescription meds as directed and report if symptoms worsen or seek immediate care. May continue pain meds prn spaingly

## 2018-09-07 MED FILL — ROSUVASTATIN CALCIUM 40 MG: 40 | 30 days supply | Qty: 30 | Fill #6

## 2018-09-14 MED FILL — METOPROLOL TARTRATE 25 MG T: 25 | 30 days supply | Qty: 60 | Fill #2

## 2018-09-17 ENCOUNTER — Other Ambulatory Visit: Payer: Self-pay

## 2018-09-17 ENCOUNTER — Other Ambulatory Visit: Payer: Self-pay | Admitting: Family Medicine

## 2018-09-17 DIAGNOSIS — I25119 Atherosclerotic heart disease of native coronary artery with unspecified angina pectoris: Secondary | ICD-10-CM

## 2018-09-17 MED ORDER — TESTOSTERONE 12.5 MG/ACT (1%) TD GEL: 100.0000 mg | Freq: Every day | TRANSDERMAL | 0 refills | Status: DC

## 2018-09-17 MED ORDER — CLOPIDOGREL BISULFATE 75 MG PO TABS: 75.0000 mg | ORAL_TABLET | Freq: Every day | ORAL | 1 refills | Status: DC

## 2018-09-17 MED ORDER — HYDROCODONE-ACETAMINOPHEN 10-325 MG PO TABS: 1.0000 | ORAL_TABLET | Freq: Four times a day (QID) | ORAL | 0 refills | Status: DC | PRN

## 2018-09-17 MED FILL — TESTOSTERONE 12.5 MG/1.25 G: 12.5 MG/ACT | 30 days supply | Qty: 300 | Fill #0

## 2018-09-17 MED FILL — CLOPIDOGREL 75 MG TABLET: 75 | 30 days supply | Qty: 30 | Fill #0

## 2018-09-17 MED FILL — HYDROCODON-APAP 10-325: 10-325 | 30 days supply | Qty: 120 | Fill #0

## 2018-09-17 MED FILL — ALPRAZolam 1 MG TABS: 1 | 30 days supply | Qty: 60 | Fill #0

## 2018-09-17 NOTE — Telephone Encounter (Signed)
Pt is requesting refill on testosterone and hydrocodone.   Last OV: 08/25/2018 Last Fill on testosterone: 05/21/2018 #300g and 0RF Last Fill on hydrocodone: 08/20/2018 #120 and 0RF UDS: 01/23/2018 Low risk

## 2018-09-17 NOTE — Telephone Encounter (Signed)
Refill for plavix has been given.

## 2018-10-08 MED FILL — ROSUVASTATIN CALCIUM 40 MG: 40 | 30 days supply | Qty: 30 | Fill #7

## 2018-10-15 ENCOUNTER — Other Ambulatory Visit: Payer: Self-pay | Admitting: Family Medicine

## 2018-10-15 MED ORDER — HYDROCODONE-ACETAMINOPHEN 10-325 MG PO TABS: 1.0000 | ORAL_TABLET | Freq: Four times a day (QID) | ORAL | 0 refills | Status: DC | PRN

## 2018-10-16 MED FILL — HYDROCODON-APAP 10-325: 10-325 | 30 days supply | Qty: 120 | Fill #0

## 2018-10-19 MED FILL — CLOPIDOGREL 75 MG TABLET: 75 | 30 days supply | Qty: 30 | Fill #1

## 2018-10-28 MED FILL — METOPROLOL TARTRATE 25 MG T: 25 | 30 days supply | Qty: 60 | Fill #0

## 2018-11-11 MED FILL — ROSUVASTATIN CALCIUM 40 MG: 40 | 30 days supply | Qty: 30 | Fill #8

## 2018-11-12 ENCOUNTER — Other Ambulatory Visit: Payer: Self-pay | Admitting: Family Medicine

## 2018-11-13 MED ORDER — HYDROCODONE-ACETAMINOPHEN 10-325 MG PO TABS: 1.0000 | ORAL_TABLET | Freq: Four times a day (QID) | ORAL | 0 refills | Status: DC | PRN

## 2018-11-13 MED FILL — HYDROCODON-APAP 10-325: 10-325 | 30 days supply | Qty: 120 | Fill #0

## 2018-11-16 MED FILL — CLOPIDOGREL 75 MG TABLET: 75 | 30 days supply | Qty: 30 | Fill #2

## 2018-12-11 ENCOUNTER — Other Ambulatory Visit: Payer: Self-pay | Admitting: Cardiology

## 2018-12-11 ENCOUNTER — Other Ambulatory Visit: Payer: Self-pay

## 2018-12-11 ENCOUNTER — Other Ambulatory Visit: Payer: Self-pay | Admitting: Family Medicine

## 2018-12-11 MED ORDER — HYDROCODONE-ACETAMINOPHEN 10-325 MG PO TABS: 1.0000 | ORAL_TABLET | Freq: Four times a day (QID) | ORAL | 0 refills | Status: DC | PRN

## 2018-12-11 MED ORDER — METOPROLOL TARTRATE 25 MG PO TABS: 25.0000 mg | ORAL_TABLET | Freq: Two times a day (BID) | ORAL | 0 refills | Status: DC

## 2018-12-11 MED FILL — METOPROLOL TARTRATE 25 MG T: 25 | 30 days supply | Qty: 60 | Fill #0

## 2018-12-11 MED FILL — HYDROCODON-APAP 10-325: 10-325 | 30 days supply | Qty: 120 | Fill #0

## 2018-12-11 MED FILL — ROSUVASTATIN CALCIUM 40 MG: 40 | 30 days supply | Qty: 30 | Fill #0

## 2018-12-18 MED FILL — CLOPIDOGREL 75 MG TABLET: 75 | 30 days supply | Qty: 30 | Fill #3

## 2019-01-11 ENCOUNTER — Other Ambulatory Visit: Payer: Self-pay

## 2019-01-11 ENCOUNTER — Other Ambulatory Visit: Payer: Self-pay | Admitting: Family Medicine

## 2019-01-11 MED ORDER — HYDROCODONE-ACETAMINOPHEN 10-325 MG PO TABS: 1.0000 | ORAL_TABLET | Freq: Four times a day (QID) | ORAL | 0 refills | Status: DC | PRN

## 2019-01-11 MED ORDER — ROSUVASTATIN CALCIUM 40 MG PO TABS: 40.0000 mg | ORAL_TABLET | Freq: Every day | ORAL | 0 refills | Status: DC

## 2019-01-11 MED FILL — ROSUVASTATIN CALCIUM 40 MG: 40 | 15 days supply | Qty: 15 | Fill #0

## 2019-01-11 MED FILL — HYDROCODON-APAP 10-325: 10-325 | 30 days supply | Qty: 120 | Fill #0

## 2019-01-20 ENCOUNTER — Other Ambulatory Visit: Payer: Self-pay

## 2019-01-21 MED FILL — ALPRAZolam 1 MG TABS: 1 | 30 days supply | Qty: 60 | Fill #1

## 2019-01-21 MED FILL — CLOPIDOGREL 75 MG TABLET: 75 | 30 days supply | Qty: 30 | Fill #4

## 2019-01-22 DIAGNOSIS — J111 Influenza due to unidentified influenza virus with other respiratory manifestations: Secondary | ICD-10-CM | POA: Diagnosis not present

## 2019-01-22 DIAGNOSIS — Z87891 Personal history of nicotine dependence: Secondary | ICD-10-CM | POA: Diagnosis not present

## 2019-01-22 DIAGNOSIS — R05 Cough: Secondary | ICD-10-CM | POA: Diagnosis not present

## 2019-01-26 MED FILL — ROSUVASTATIN CALCIUM 40 MG: 40 | 30 days supply | Qty: 30 | Fill #1

## 2019-01-26 MED FILL — METOPROLOL TARTRATE 25 MG T: 25 | 30 days supply | Qty: 60 | Fill #1

## 2019-02-08 ENCOUNTER — Other Ambulatory Visit: Payer: Self-pay | Admitting: Family Medicine

## 2019-02-08 MED ORDER — HYDROCODONE-ACETAMINOPHEN 10-325 MG PO TABS: 1.0000 | ORAL_TABLET | Freq: Four times a day (QID) | ORAL | 0 refills | Status: DC | PRN

## 2019-02-08 MED FILL — HYDROCODON-APAP 10-325: 10-325 | 30 days supply | Qty: 120 | Fill #0

## 2019-02-23 ENCOUNTER — Other Ambulatory Visit: Payer: Self-pay

## 2019-02-23 ENCOUNTER — Ambulatory Visit (INDEPENDENT_AMBULATORY_CARE_PROVIDER_SITE_OTHER): Payer: BLUE CROSS/BLUE SHIELD | Admitting: Family Medicine

## 2019-02-23 DIAGNOSIS — I10 Essential (primary) hypertension: Secondary | ICD-10-CM | POA: Diagnosis not present

## 2019-02-23 DIAGNOSIS — M542 Cervicalgia: Secondary | ICD-10-CM | POA: Diagnosis not present

## 2019-02-23 DIAGNOSIS — R739 Hyperglycemia, unspecified: Secondary | ICD-10-CM

## 2019-02-23 DIAGNOSIS — I25119 Atherosclerotic heart disease of native coronary artery with unspecified angina pectoris: Secondary | ICD-10-CM

## 2019-02-23 DIAGNOSIS — Z87891 Personal history of nicotine dependence: Secondary | ICD-10-CM

## 2019-02-23 DIAGNOSIS — E291 Testicular hypofunction: Secondary | ICD-10-CM

## 2019-02-23 DIAGNOSIS — E782 Mixed hyperlipidemia: Secondary | ICD-10-CM | POA: Diagnosis not present

## 2019-02-23 DIAGNOSIS — R7989 Other specified abnormal findings of blood chemistry: Secondary | ICD-10-CM

## 2019-02-23 NOTE — Assessment & Plan Note (Signed)
Has seen Dr Bettina Gavia in past and he needs a new referral for follow up. Would like to stay in the [facility] so will place new referral

## 2019-02-23 NOTE — Assessment & Plan Note (Signed)
Doing well Encouraged moist heat and gentle stretching as tolerated. May try NSAIDs and prescription meds as directed and report if symptoms worsen or seek immediate care.

## 2019-02-23 NOTE — Assessment & Plan Note (Addendum)
Well controlled, no changes to meds. Encouraged heart healthy diet such as the DASH diet and exercise as tolerated. He denies any recent concerning numbers or symptoms

## 2019-02-23 NOTE — Assessment & Plan Note (Signed)
hgba1c acceptable, minimize simple carbs. Increase exercise as tolerated.  

## 2019-02-24 NOTE — Assessment & Plan Note (Signed)
Has not smoked in 4 years but is now also not vaping. He quit after our last visit in January. His breathing feels better and he feels better as well.

## 2019-02-24 NOTE — Progress Notes (Signed)
Virtual Visit via Video Note  I connected with Elijah Marquez on 02/24/19 at  4:00 PM EDT by a video enabled telemedicine application and verified that I am speaking with the correct person using two identifiers.   I discussed the limitations of evaluation and management by telemedicine and the availability of in person appointments. The patient expressed understanding and agreed to proceed.    Patient ID: Elijah Marquez, male    DOB: May 15, 1971, 48 y.o.   MRN: [id number]  No chief complaint on file.   HPI Patient is in today for follow up. Overall he feels well. No recent febrile illness or hospitalizations. His neck and shoulder continue to bother him but he still manages to work full time. He has noted some congestion but it is better than it was 3 weeks ago. He was trested for flu and was having sinus pressure and migraines and that has resolved. He was tested and found to have type A. Denies CP/palp/SOB/HA/fevers/GI or GU c/o. Taking meds as prescribed  Past Medical History:  Diagnosis Date  . Anxiety 08/23/2012  . Anxiety as acute reaction to exceptional stress 09/20/2012  . Chicken pox as a child  . Chronic lower back pain   . Fatigue   . History of tobacco abuse    Failed Chantix and wellbutrin   . Hyperlipidemia 08/23/2012  . Hypogonadism in male 10/02/2014  . Intermittent claudication (HCC) 08/13/2015   B/l legs  . Neck pain on left side 08/23/2012  . Osteoarthritis    "all over" (07/02/2017)  . Osteoarthritis of both knees   . Tobacco abuse     Past Surgical History:  Procedure Laterality Date  . ANTERIOR CERVICAL DECOMP/DISCECTOMY FUSION  12/08/2012   C4,5,,6  . BACK SURGERY    . CORONARY ANGIOPLASTY WITH STENT PLACEMENT  07/02/2017   "1 stent"  . CORONARY STENT INTERVENTION N/A 07/02/2017   Procedure: CORONARY STENT INTERVENTION;  Surgeon: Troy Sine, MD;  Location: [facility];  Service: Cardiovascular;  Laterality: N/A;  LAD/DIAG  . KNEE ARTHROSCOPY  Bilateral 2011-2012   left-right  . LEFT HEART CATH AND CORONARY ANGIOGRAPHY N/A 07/02/2017   Procedure: Left Heart Cath and Coronary Angiography;  Surgeon: Troy Sine, MD;  Location: [facility];  Service: Cardiovascular;  Laterality: N/A;  . MULTIPLE TOOTH EXTRACTIONS    . WISDOM TOOTH EXTRACTION      Family History  Problem Relation Age of Onset  . Fibromyalgia Mother   . Coronary artery disease Mother   . Hypertension Mother   . Heart attack Mother   . Heart disease Mother        CAD  . Peripheral vascular disease Mother 60       hemorrhage from vascular procedure  . Fibromyalgia Sister   . Hypertension Sister   . Heart disease Sister   . Other Son        mildly mentally handicap  . Seizures Son   . Lupus Sister   . Emphysema Sister        smoker  . Kidney disease Daughter   . Alcohol abuse Father     Social History   Socioeconomic History  . Marital status: Married    Spouse name: Not on file  . Number of children: Not on file  . Years of education: Not on file  . Highest education level: Not on file  Occupational History  . Not on file  Social Needs  . Financial resource strain:  Not on file  . Food insecurity:    Worry: Not on file    Inability: Not on file  . Transportation needs:    Medical: Not on file    Non-medical: Not on file  Tobacco Use  . Smoking status: Former Smoker    Packs/day: 1.50    Years: 35.00    Pack years: 52.50    Types: Cigarettes    Last attempt to quit: 07/31/2015    Years since quitting: 3.5  . Smokeless tobacco: Never Used  Substance and Sexual Activity  . Alcohol use: No  . Drug use: No  . Sexual activity: Not Currently    Partners: Female  Lifestyle  . Physical activity:    Days per week: Not on file    Minutes per session: Not on file  . Stress: Not on file  Relationships  . Social connections:    Talks on phone: Not on file    Gets together: Not on file    Attends religious service: Not on file     Active member of club or organization: Not on file    Attends meetings of clubs or organizations: Not on file    Relationship status: Not on file  . Intimate partner violence:    Fear of current or ex partner: Not on file    Emotionally abused: Not on file    Physically abused: Not on file    Forced sexual activity: Not on file  Other Topics Concern  . Not on file  Social History Narrative  . Not on file    Outpatient Medications Prior to Visit  Medication Sig Dispense Refill  . ALPRAZolam (XANAX) 1 MG tablet Take 1 tablet (1 mg total) by mouth 2 (two) times daily as needed for sleep or anxiety. 60 tablet 2  . aspirin 81 MG tablet Take 1 tablet (81 mg total) by mouth daily. 30 tablet   . clopidogrel (PLAVIX) 75 MG tablet Take 1 tablet (75 mg total) by mouth daily. 90 tablet 1  . HYDROcodone-acetaminophen (NORCO) 10-325 MG tablet Take 1 tablet by mouth every 6 (six) hours as needed. 120 tablet 0  . metoprolol tartrate (LOPRESSOR) 25 MG tablet Take 1 tablet (25 mg total) by mouth 2 (two) times daily. 180 tablet 0  . nitroGLYCERIN (NITROSTAT) 0.4 MG SL tablet Place 1 tablet (0.4 mg total) under the tongue every 5 (five) minutes as needed for chest pain. To ER if no resolution 25 tablet 3  . rosuvastatin (CRESTOR) 40 MG tablet Take 1 tablet (40 mg total) by mouth daily. 15 tablet 0  . testosterone (ANDROGEL) 50 MG/5GM (1%) GEL 8 pumps daily 2 Package 0  . Testosterone 12.5 MG/ACT (1%) GEL Apply 100 mg topically daily. 300 g 0  . Vitamin D, Ergocalciferol, (DRISDOL) 50000 units CAPS capsule Take 1 capsule (50,000 Units total) by mouth every 7 (seven) days. 4 capsule 4  . rosuvastatin (CRESTOR) 40 MG tablet daily.  3   No facility-administered medications prior to visit.     Allergies  Allergen Reactions  . Atorvastatin Other (See Comments)    Caused leg pain  . Penicillins Swelling and Rash    Has patient had a PCN reaction causing immediate rash, facial/tongue/throat swelling, SOB or  lightheadedness with hypotension: Yes Has patient had a PCN reaction causing severe rash involving mucus membranes or skin necrosis: No Has patient had a PCN reaction that required hospitalization: Yes Has patient had a PCN reaction occurring within the  last 10 years: No If all of the above answers are "NO", then may proceed with Cephalosporin use.     Review of Systems  Constitutional: Positive for malaise/fatigue. Negative for fever.  HENT: Negative for congestion.   Eyes: Negative for blurred vision.  Respiratory: Negative for shortness of breath.   Cardiovascular: Negative for chest pain, palpitations and leg swelling.  Gastrointestinal: Negative for abdominal pain, blood in stool and nausea.  Genitourinary: Negative for dysuria and frequency.  Musculoskeletal: Positive for joint pain and neck pain. Negative for falls.  Skin: Negative for rash.  Neurological: Negative for dizziness, loss of consciousness and headaches.  Endo/Heme/Allergies: Negative for environmental allergies.  Psychiatric/Behavioral: Negative for depression. The patient is not nervous/anxious.        Objective:    Physical Exam Constitutional:      Appearance: Normal appearance.  Neurological:     Mental Status: He is alert.  Psychiatric:        Mood and Affect: Mood normal.        Behavior: Behavior normal.        Thought Content: Thought content normal.        Judgment: Judgment normal.     There were no vitals taken for this visit. Wt Readings from Last 3 Encounters:  08/25/18 189 lb (85.7 kg)  02/17/18 185 lb 12.8 oz (84.3 kg)  10/21/17 191 lb 12.8 oz (87 kg)    Diabetic Foot Exam - Simple   No data filed     Lab Results  Component Value Date   WBC 9.1 08/25/2018   HGB 14.5 08/25/2018   HCT 42.0 08/25/2018   PLT 203.0 08/25/2018   GLUCOSE 81 08/25/2018   CHOL 112 08/25/2018   TRIG 148.0 08/25/2018   HDL 40.00 08/25/2018   LDLDIRECT 77.0 06/17/2017   LDLCALC 43 08/25/2018   ALT  35 08/25/2018   AST 27 08/25/2018   NA 140 08/25/2018   K 3.8 08/25/2018   CL 99 08/25/2018   CREATININE 0.96 08/25/2018   BUN 9 08/25/2018   CO2 33 (H) 08/25/2018   TSH 4.17 08/25/2018   INR 0.97 07/02/2017   HGBA1C 6.2 08/25/2018   MICROALBUR 0.7 08/25/2018    Lab Results  Component Value Date   TSH 4.17 08/25/2018   Lab Results  Component Value Date   WBC 9.1 08/25/2018   HGB 14.5 08/25/2018   HCT 42.0 08/25/2018   MCV 85.2 08/25/2018   PLT 203.0 08/25/2018   Lab Results  Component Value Date   NA 140 08/25/2018   K 3.8 08/25/2018   CO2 33 (H) 08/25/2018   GLUCOSE 81 08/25/2018   BUN 9 08/25/2018   CREATININE 0.96 08/25/2018   BILITOT 0.5 08/25/2018   ALKPHOS 77 08/25/2018   AST 27 08/25/2018   ALT 35 08/25/2018   PROT 7.6 08/25/2018   ALBUMIN 4.8 08/25/2018   CALCIUM 9.8 08/25/2018   ANIONGAP 7 07/03/2017   GFR 88.37 08/25/2018   Lab Results  Component Value Date   CHOL 112 08/25/2018   Lab Results  Component Value Date   HDL 40.00 08/25/2018   Lab Results  Component Value Date   LDLCALC 43 08/25/2018   Lab Results  Component Value Date   TRIG 148.0 08/25/2018   Lab Results  Component Value Date   CHOLHDL 3 08/25/2018   Lab Results  Component Value Date   HGBA1C 6.2 08/25/2018       Assessment & Plan:   Problem List  Items Addressed This Visit    History of tobacco abuse    Has not smoked in 4 years but is now also not vaping. He quit after our last visit in January. His breathing feels better and he feels better as well.       Neck pain on left side    Doing well Encouraged moist heat and gentle stretching as tolerated. May try NSAIDs and prescription meds as directed and report if symptoms worsen or seek immediate care.       Hyperlipidemia - Primary    Tolerating rosuvastatin. Check lipid level      Relevant Orders   Lipid panel   Hypogonadism in male    Check level with next blood check      Essential hypertension     Well controlled, no changes to meds. Encouraged heart healthy diet such as the DASH diet and exercise as tolerated. He denies any recent concerning numbers or symptoms      Relevant Orders   CBC   Comprehensive metabolic panel   Coronary artery disease involving native coronary artery of native heart with angina pectoris [facility])    Has seen Dr Bettina Gavia in past and he needs a new referral for follow up. Would like to stay in the [facility] so will place new referral      Hyperglycemia    hgba1c acceptable, minimize simple carbs. Increase exercise as tolerated.       Relevant Orders   Hemoglobin A1c   TSH    Other Visit Diagnoses    Low testosterone       Relevant Orders   TSH   Testosterone      I am having Elijah Marquez maintain his aspirin, nitroGLYCERIN, Vitamin D (Ergocalciferol), testosterone, ALPRAZolam, Testosterone, clopidogrel, metoprolol tartrate, rosuvastatin, and HYDROcodone-acetaminophen.  No orders of the defined types were placed in this encounter.  I discussed the assessment and treatment plan with the patient. The patient was provided an opportunity to ask questions and all were answered. The patient agreed with the plan and demonstrated an understanding of the instructions.   The patient was advised to call back or seek an in-person evaluation if the symptoms worsen or if the condition fails to improve as anticipated.  I provided 13 minutes of non-face-to-face time during this encounter.   Penni Homans, MD

## 2019-02-24 NOTE — Assessment & Plan Note (Signed)
Check level with next blood check

## 2019-02-24 NOTE — Assessment & Plan Note (Signed)
Tolerating rosuvastatin. Check lipid level

## 2019-02-25 ENCOUNTER — Other Ambulatory Visit (INDEPENDENT_AMBULATORY_CARE_PROVIDER_SITE_OTHER): Payer: BLUE CROSS/BLUE SHIELD

## 2019-02-25 ENCOUNTER — Other Ambulatory Visit: Payer: Self-pay

## 2019-02-25 DIAGNOSIS — R739 Hyperglycemia, unspecified: Secondary | ICD-10-CM | POA: Diagnosis not present

## 2019-02-25 DIAGNOSIS — R7989 Other specified abnormal findings of blood chemistry: Secondary | ICD-10-CM | POA: Diagnosis not present

## 2019-02-25 DIAGNOSIS — E782 Mixed hyperlipidemia: Secondary | ICD-10-CM | POA: Diagnosis not present

## 2019-02-25 DIAGNOSIS — I10 Essential (primary) hypertension: Secondary | ICD-10-CM

## 2019-02-26 ENCOUNTER — Other Ambulatory Visit: Payer: Self-pay

## 2019-02-26 LAB — LIPID PANEL
Cholesterol: 118 mg/dL (ref 0–200)
HDL: 37.3 mg/dL — ABNORMAL LOW (ref >39.00)
NonHDL: 80.85
Total CHOL/HDL Ratio: 3
Triglycerides: 226 mg/dL — ABNORMAL HIGH (ref 0.0–149.0)
VLDL: 45.2 mg/dL — ABNORMAL HIGH (ref 0.0–40.0)

## 2019-02-26 LAB — COMPREHENSIVE METABOLIC PANEL
ALT: 32 U/L (ref 0–53)
AST: 24 U/L (ref 0–37)
Albumin: 4.5 g/dL (ref 3.5–5.2)
Alkaline Phosphatase: 64 U/L (ref 39–117)
BUN: 17 mg/dL (ref 6–23)
CO2: 30 mEq/L (ref 19–32)
Calcium: 9.4 mg/dL (ref 8.4–10.5)
Chloride: 102 mEq/L (ref 96–112)
Creatinine, Ser: 0.87 mg/dL (ref 0.40–1.50)
GFR: 92.95 mL/min (ref >60.00)
Glucose, Bld: 91 mg/dL (ref 70–99)
Potassium: 4 mEq/L (ref 3.5–5.1)
Sodium: 140 mEq/L (ref 135–145)
Total Bilirubin: 0.4 mg/dL (ref 0.2–1.2)
Total Protein: 6.8 g/dL (ref 6.0–8.3)

## 2019-02-26 LAB — CBC
HCT: 40.8 % (ref 39.0–52.0)
HEMOGLOBIN: 14 g/dL (ref 13.0–17.0)
MCHC: 34.4 g/dL (ref 30.0–36.0)
MCV: 87 fl (ref 78.0–100.0)
Platelets: 182 10*3/uL (ref 150.0–400.0)
RBC: 4.69 Mil/uL (ref 4.22–5.81)
RDW: 13.3 % (ref 11.5–15.5)
WBC: 8.1 10*3/uL (ref 4.0–10.5)

## 2019-02-26 LAB — TESTOSTERONE: TESTOSTERONE: 106.06 ng/dL — AB (ref 300.00–890.00)

## 2019-02-26 LAB — TSH: TSH: 2.11 u[IU]/mL (ref 0.35–4.50)

## 2019-02-26 LAB — HEMOGLOBIN A1C: Hgb A1c MFr Bld: 6.2 % (ref 4.6–6.5)

## 2019-02-26 LAB — LDL CHOLESTEROL, DIRECT: Direct LDL: 61 mg/dL

## 2019-02-26 MED ORDER — ROSUVASTATIN CALCIUM 40 MG PO TABS: 40.0000 mg | ORAL_TABLET | Freq: Every day | ORAL | 2 refills | Status: DC

## 2019-02-26 MED FILL — CLOPIDOGREL 75 MG TABLET: 75 | 30 days supply | Qty: 30 | Fill #5

## 2019-02-26 MED FILL — ROSUVASTATIN CALCIUM 40 MG: 40 | 30 days supply | Qty: 30 | Fill #0

## 2019-02-26 NOTE — Telephone Encounter (Signed)
Patient MUST be seen by Dr. Bettina Gavia for future refills, please inform patient

## 2019-03-11 ENCOUNTER — Other Ambulatory Visit: Payer: Self-pay | Admitting: Family Medicine

## 2019-03-11 MED ORDER — HYDROCODONE-ACETAMINOPHEN 10-325 MG PO TABS: 1.0000 | ORAL_TABLET | Freq: Four times a day (QID) | ORAL | 0 refills | Status: DC | PRN

## 2019-03-11 MED FILL — HYDROCODON-APAP 10-325: 10-325 | 30 days supply | Qty: 120 | Fill #0

## 2019-03-15 MED FILL — METOPROLOL TARTRATE 25 MG T: 25 | 30 days supply | Qty: 60 | Fill #2

## 2019-03-26 ENCOUNTER — Other Ambulatory Visit: Payer: Self-pay | Admitting: Cardiology

## 2019-03-26 DIAGNOSIS — I25119 Atherosclerotic heart disease of native coronary artery with unspecified angina pectoris: Secondary | ICD-10-CM

## 2019-03-26 MED FILL — CLOPIDOGREL 75 MG TABLET: 75 | 30 days supply | Qty: 30 | Fill #0

## 2019-03-26 NOTE — Telephone Encounter (Signed)
Rx refill sent to pharmacy. 

## 2019-03-30 MED FILL — ROSUVASTATIN CALCIUM 40 MG: 40 | 30 days supply | Qty: 30 | Fill #1

## 2019-04-09 ENCOUNTER — Other Ambulatory Visit: Payer: Self-pay | Admitting: Family Medicine

## 2019-04-09 NOTE — Telephone Encounter (Signed)
Requesting: NORCO Contract: 01/23/2018 UDS: 01/27/2018, low risk, next screen 07/27/2018 Last OV: 02/23/2019 Next OV: N/A  Last Refill: 03/11/2019, #120--0 RF Database:   Please advise

## 2019-04-11 MED ORDER — HYDROCODONE-ACETAMINOPHEN 10-325 MG PO TABS: 1.0000 | ORAL_TABLET | Freq: Four times a day (QID) | ORAL | 0 refills | Status: DC | PRN

## 2019-04-12 MED FILL — HYDROCODON-APAP 10-325: 10-325 | 30 days supply | Qty: 120 | Fill #0

## 2019-04-30 MED FILL — ROSUVASTATIN CALCIUM 40 MG: 40 | 30 days supply | Qty: 30 | Fill #2

## 2019-04-30 MED FILL — CLOPIDOGREL 75 MG TABLET: 75 | 30 days supply | Qty: 30 | Fill #1

## 2019-05-04 ENCOUNTER — Other Ambulatory Visit: Payer: Self-pay

## 2019-05-04 MED ORDER — METOPROLOL TARTRATE 25 MG PO TABS: 25.0000 mg | ORAL_TABLET | Freq: Two times a day (BID) | ORAL | 0 refills | Status: DC

## 2019-05-04 MED FILL — METOPROLOL TARTRATE 25 MG T: 25 | 14 days supply | Qty: 28 | Fill #0

## 2019-05-12 ENCOUNTER — Other Ambulatory Visit: Payer: Self-pay | Admitting: Family Medicine

## 2019-05-12 MED ORDER — HYDROCODONE-ACETAMINOPHEN 10-325 MG PO TABS: 1.0000 | ORAL_TABLET | Freq: Four times a day (QID) | ORAL | 0 refills | Status: DC | PRN

## 2019-05-12 MED ORDER — TESTOSTERONE 12.5 MG/ACT (1%) TD GEL: 100.0000 mg | Freq: Every day | TRANSDERMAL | 0 refills | Status: DC

## 2019-05-12 MED FILL — HYDROCODON-APAP 10-325: 10-325 | 30 days supply | Qty: 120 | Fill #0

## 2019-05-12 MED FILL — TESTOSTERONE 12.5 MG/1.25 G: 12.5 MG/ACT | 30 days supply | Qty: 300 | Fill #0

## 2019-05-24 ENCOUNTER — Other Ambulatory Visit: Payer: Self-pay

## 2019-05-26 ENCOUNTER — Other Ambulatory Visit: Payer: Self-pay

## 2019-05-26 DIAGNOSIS — I25119 Atherosclerotic heart disease of native coronary artery with unspecified angina pectoris: Secondary | ICD-10-CM

## 2019-05-27 ENCOUNTER — Other Ambulatory Visit: Payer: Self-pay | Admitting: Family Medicine

## 2019-05-27 DIAGNOSIS — F32A Depression, unspecified: Secondary | ICD-10-CM

## 2019-05-27 DIAGNOSIS — F419 Anxiety disorder, unspecified: Secondary | ICD-10-CM

## 2019-05-27 DIAGNOSIS — F329 Major depressive disorder, single episode, unspecified: Secondary | ICD-10-CM

## 2019-05-27 MED FILL — ROSUVASTATIN CALCIUM 40 MG: 40 | 30 days supply | Qty: 30 | Fill #2

## 2019-05-28 MED FILL — CLOPIDOGREL 75 MG TABLET: 75 | 30 days supply | Qty: 30 | Fill #2

## 2019-05-28 NOTE — Telephone Encounter (Signed)
Please advise 

## 2019-05-31 MED FILL — ALPRAZolam 1 MG TABS: 1 | 30 days supply | Qty: 60 | Fill #0

## 2019-06-10 ENCOUNTER — Other Ambulatory Visit: Payer: Self-pay | Admitting: Family Medicine

## 2019-06-10 MED ORDER — HYDROCODONE-ACETAMINOPHEN 10-325 MG PO TABS: 1.0000 | ORAL_TABLET | Freq: Four times a day (QID) | ORAL | 0 refills | Status: DC | PRN

## 2019-06-10 MED FILL — HYDROCODON-APAP 10-325: 10-325 | 30 days supply | Qty: 120 | Fill #0

## 2019-06-17 MED FILL — ALPRAZolam 1 MG TABS: 1 | 30 days supply | Qty: 60 | Fill #0

## 2019-06-18 ENCOUNTER — Telehealth: Payer: Self-pay | Admitting: Family Medicine

## 2019-06-18 ENCOUNTER — Telehealth: Payer: Self-pay

## 2019-06-18 NOTE — Telephone Encounter (Signed)
Called pt and advised due for 3 mo f/u 05/26/2019. Pt notes that he is not ready yet to schedule.

## 2019-06-18 NOTE — Telephone Encounter (Signed)
Copied from [place]. Topic: Appointment Scheduling - Scheduling Inquiry for Clinic >> Jun 17, 2019  2:13 PM Nils Flack wrote: Reason for CRM: pt is wanting to know when he is due for an appt since he is now 48 Please call  [phone]

## 2019-06-29 ENCOUNTER — Other Ambulatory Visit: Payer: Self-pay | Admitting: *Deleted

## 2019-06-29 DIAGNOSIS — I25119 Atherosclerotic heart disease of native coronary artery with unspecified angina pectoris: Secondary | ICD-10-CM

## 2019-06-29 MED ORDER — CLOPIDOGREL BISULFATE 75 MG PO TABS: 75.0000 mg | ORAL_TABLET | Freq: Every day | ORAL | 0 refills | Status: DC

## 2019-06-29 MED ORDER — ROSUVASTATIN CALCIUM 40 MG PO TABS: 40.0000 mg | ORAL_TABLET | Freq: Every day | ORAL | 0 refills | Status: DC

## 2019-07-01 ENCOUNTER — Telehealth: Payer: Self-pay | Admitting: *Deleted

## 2019-07-01 NOTE — Telephone Encounter (Signed)
Requesting: xanax, hydrocodone, testosterone Contract: due UDS: due Last OV: 02/23/19 Next OV: none Last Refill:  Xanax 05/30/19, hydrocodone 06/10/19, testoterone 05/12/19 Database:   Please advise

## 2019-07-01 NOTE — Telephone Encounter (Signed)
He can have xanax with 1 rf. Testosterone with no rf this week and hydrocodone next week. He needs an appt in September to keep his meds current. Please load

## 2019-07-02 NOTE — Telephone Encounter (Signed)
Please schedule patient a visit with pcp sometime in September

## 2019-07-05 NOTE — Telephone Encounter (Signed)
Appointment scheduled.

## 2019-07-09 ENCOUNTER — Other Ambulatory Visit: Payer: Self-pay | Admitting: Family Medicine

## 2019-07-09 MED ORDER — HYDROCODONE-ACETAMINOPHEN 10-325 MG PO TABS: 1.0000 | ORAL_TABLET | Freq: Four times a day (QID) | ORAL | 0 refills | Status: DC | PRN

## 2019-07-09 NOTE — Telephone Encounter (Signed)
Medication: HYDROcodone-acetaminophen (NORCO) 10-325 MG tablet     Patient is requesting refill.    Pharmacy:  [id number] - [place], [place] [phone] (Phone) [phone] (Fax)

## 2019-08-06 ENCOUNTER — Other Ambulatory Visit: Payer: Self-pay | Admitting: Family Medicine

## 2019-08-06 MED ORDER — HYDROCODONE-ACETAMINOPHEN 10-325 MG PO TABS: 1.0000 | ORAL_TABLET | Freq: Four times a day (QID) | ORAL | 0 refills | Status: DC | PRN

## 2019-08-10 ENCOUNTER — Ambulatory Visit (INDEPENDENT_AMBULATORY_CARE_PROVIDER_SITE_OTHER): Payer: BC Managed Care – PPO | Admitting: Family Medicine

## 2019-08-10 ENCOUNTER — Encounter: Payer: Self-pay | Admitting: Family Medicine

## 2019-08-10 ENCOUNTER — Other Ambulatory Visit: Payer: Self-pay

## 2019-08-10 VITALS — Wt 190.0 lb

## 2019-08-10 DIAGNOSIS — E782 Mixed hyperlipidemia: Secondary | ICD-10-CM

## 2019-08-10 DIAGNOSIS — R351 Nocturia: Secondary | ICD-10-CM | POA: Diagnosis not present

## 2019-08-10 DIAGNOSIS — M546 Pain in thoracic spine: Secondary | ICD-10-CM

## 2019-08-10 DIAGNOSIS — I10 Essential (primary) hypertension: Secondary | ICD-10-CM

## 2019-08-10 DIAGNOSIS — Z720 Tobacco use: Secondary | ICD-10-CM | POA: Diagnosis not present

## 2019-08-10 DIAGNOSIS — F32A Depression, unspecified: Secondary | ICD-10-CM

## 2019-08-10 DIAGNOSIS — I25119 Atherosclerotic heart disease of native coronary artery with unspecified angina pectoris: Secondary | ICD-10-CM

## 2019-08-10 DIAGNOSIS — R739 Hyperglycemia, unspecified: Secondary | ICD-10-CM

## 2019-08-10 DIAGNOSIS — F419 Anxiety disorder, unspecified: Secondary | ICD-10-CM | POA: Diagnosis not present

## 2019-08-10 DIAGNOSIS — G8929 Other chronic pain: Secondary | ICD-10-CM

## 2019-08-10 DIAGNOSIS — E291 Testicular hypofunction: Secondary | ICD-10-CM

## 2019-08-10 DIAGNOSIS — F329 Major depressive disorder, single episode, unspecified: Secondary | ICD-10-CM

## 2019-08-10 DIAGNOSIS — Z79899 Other long term (current) drug therapy: Secondary | ICD-10-CM

## 2019-08-10 MED ORDER — ALPRAZOLAM 1 MG PO TABS: 1.0000 mg | ORAL_TABLET | Freq: Two times a day (BID) | ORAL | 2 refills | Status: DC | PRN

## 2019-08-10 MED ORDER — ROSUVASTATIN CALCIUM 40 MG PO TABS: 40.0000 mg | ORAL_TABLET | Freq: Every day | ORAL | 0 refills | Status: DC

## 2019-08-10 MED ORDER — VITAMIN D (ERGOCALCIFEROL) 1.25 MG (50000 UNIT) PO CAPS: 50000.0000 [IU] | ORAL_CAPSULE | ORAL | 4 refills | Status: DC

## 2019-08-10 MED ORDER — TESTOSTERONE 12.5 MG/ACT (1%) TD GEL: 100.0000 mg | Freq: Every day | TRANSDERMAL | 0 refills | Status: DC

## 2019-08-10 MED ORDER — NITROGLYCERIN 0.4 MG SL SUBL: 0.4000 mg | SUBLINGUAL_TABLET | SUBLINGUAL | 3 refills | Status: AC | PRN

## 2019-08-10 MED ORDER — METOPROLOL TARTRATE 25 MG PO TABS: 25.0000 mg | ORAL_TABLET | Freq: Two times a day (BID) | ORAL | 1 refills | Status: DC

## 2019-08-10 MED ORDER — CLOPIDOGREL BISULFATE 75 MG PO TABS: 75.0000 mg | ORAL_TABLET | Freq: Every day | ORAL | 1 refills | Status: DC

## 2019-08-10 MED ORDER — HYDROCODONE-ACETAMINOPHEN 10-325 MG PO TABS: 1.0000 | ORAL_TABLET | Freq: Four times a day (QID) | ORAL | 0 refills | Status: DC | PRN

## 2019-08-10 NOTE — Progress Notes (Signed)
Patient wants to see a cardiologist here in [place]

## 2019-08-11 NOTE — Assessment & Plan Note (Signed)
Is feeling well but has been lost to cardiology follow up with the movement back to [facility] of his cardiologist. Will refer back to cardiology

## 2019-08-11 NOTE — Assessment & Plan Note (Signed)
hgba1c acceptable, minimize simple carbs. Increase exercise as tolerated.  

## 2019-08-11 NOTE — Assessment & Plan Note (Signed)
Working full time and managing his pain well with current meds. No changes there therapy at this time.

## 2019-08-11 NOTE — Assessment & Plan Note (Signed)
Tolerating statin, encouraged heart healthy diet, avoid trans fats, minimize simple carbs and saturated fats. Increase exercise as tolerated 

## 2019-08-11 NOTE — Assessment & Plan Note (Signed)
Supplement and monitor. No concerning side effects noted.

## 2019-08-11 NOTE — Assessment & Plan Note (Signed)
Monitor vitals weekly, no changes to meds. Encouraged heart healthy diet such as the DASH diet and exercise as tolerated.

## 2019-08-11 NOTE — Progress Notes (Signed)
Patient ID: Elijah Marquez, male   DOB: [birth year], 48 y.o.   MRN: [id number] Virtual Visit via Video Note  08/10/19 at  3:40 PM EDT by a video enabled telemedicine application and verified that I am speaking with the correct person using two identifiers.  Location: Patient: car Provider: office   I discussed the limitations of evaluation and management by telemedicine and the availability of in person appointments. The patient expressed understanding and agreed to proceed. Magdalene Molly, CMA was able to set patient up with visit, video   Subjective:    Patient ID: Elijah Marquez, male    DOB: 01/26/1971, 48 y.o.   MRN: [id number]  No chief complaint on file.   HPI Patient is in today for follow up on chronic medical concerns including chronic back pain, anxiety, hyperlipidemia, CAD, and more. No recent febrile illness or hospitalizations. He is managing the stress of the pandemic and has had no concerning COVID symptoms although he did have some back during mid winter that he thinks were consistent with COVID. Denies CP/palp/SOB/HA/congestion/fevers/GI or GU c/o. Taking meds as prescribed  Past Medical History:  Diagnosis Date  . Anxiety 08/23/2012  . Anxiety as acute reaction to exceptional stress 09/20/2012  . Chicken pox as a child  . Chronic lower back pain   . Fatigue   . History of tobacco abuse    Failed Chantix and wellbutrin   . Hyperlipidemia 08/23/2012  . Hypogonadism in male 10/02/2014  . Intermittent claudication (HCC) 08/13/2015   B/l legs  . Neck pain on left side 08/23/2012  . Osteoarthritis    "all over" (07/02/2017)  . Osteoarthritis of both knees   . Tobacco abuse     Past Surgical History:  Procedure Laterality Date  . ANTERIOR CERVICAL DECOMP/DISCECTOMY FUSION  12/08/2012   C4,5,,6  . BACK SURGERY    . CORONARY ANGIOPLASTY WITH STENT PLACEMENT  07/02/2017   "1 stent"  . CORONARY STENT INTERVENTION N/A 07/02/2017   Procedure: CORONARY STENT INTERVENTION;   Surgeon: Troy Sine, MD;  Location: [facility];  Service: Cardiovascular;  Laterality: N/A;  LAD/DIAG  . KNEE ARTHROSCOPY Bilateral 2011-2012   left-right  . LEFT HEART CATH AND CORONARY ANGIOGRAPHY N/A 07/02/2017   Procedure: Left Heart Cath and Coronary Angiography;  Surgeon: Troy Sine, MD;  Location: [facility];  Service: Cardiovascular;  Laterality: N/A;  . MULTIPLE TOOTH EXTRACTIONS    . WISDOM TOOTH EXTRACTION      Family History  Problem Relation Age of Onset  . Fibromyalgia Mother   . Coronary artery disease Mother   . Hypertension Mother   . Heart attack Mother   . Heart disease Mother        CAD  . Peripheral vascular disease Mother 61       hemorrhage from vascular procedure  . Fibromyalgia Sister   . Hypertension Sister   . Heart disease Sister   . Other Son        mildly mentally handicap  . Seizures Son   . Lupus Sister   . Emphysema Sister        smoker  . Kidney disease Daughter   . Alcohol abuse Father     Social History   Socioeconomic History  . Marital status: Married    Spouse name: Not on file  . Number of children: Not on file  . Years of education: Not on file  . Highest education level: Not on file  Occupational  History  . Not on file  Social Needs  . Financial resource strain: Not on file  . Food insecurity    Worry: Not on file    Inability: Not on file  . Transportation needs    Medical: Not on file    Non-medical: Not on file  Tobacco Use  . Smoking status: Former Smoker    Packs/day: 1.50    Years: 35.00    Pack years: 52.50    Types: Cigarettes    Quit date: 07/31/2015    Years since quitting: 4.0  . Smokeless tobacco: Never Used  Substance and Sexual Activity  . Alcohol use: No  . Drug use: No  . Sexual activity: Not Currently    Partners: Female  Lifestyle  . Physical activity    Days per week: Not on file    Minutes per session: Not on file  . Stress: Not on file  Relationships  . Social  Herbalist on phone: Not on file    Gets together: Not on file    Attends religious service: Not on file    Active member of club or organization: Not on file    Attends meetings of clubs or organizations: Not on file    Relationship status: Not on file  . Intimate partner violence    Fear of current or ex partner: Not on file    Emotionally abused: Not on file    Physically abused: Not on file    Forced sexual activity: Not on file  Other Topics Concern  . Not on file  Social History Narrative  . Not on file    Outpatient Medications Prior to Visit  Medication Sig Dispense Refill  . aspirin 81 MG tablet Take 1 tablet (81 mg total) by mouth daily. 30 tablet   . ALPRAZolam (XANAX) 1 MG tablet TAKE 1 TABLET (1 MG TOTAL) BY MOUTH 2 (TWO) TIMES DAILY AS NEEDED FOR SLEEP OR ANXIETY. 60 tablet 2  . clopidogrel (PLAVIX) 75 MG tablet Take 1 tablet (75 mg total) by mouth daily. 90 tablet 0  . HYDROcodone-acetaminophen (NORCO) 10-325 MG tablet Take 1 tablet by mouth every 6 (six) hours as needed. 120 tablet 0  . metoprolol tartrate (LOPRESSOR) 25 MG tablet Take 1 tablet (25 mg total) by mouth 2 (two) times daily. 28 tablet 0  . nitroGLYCERIN (NITROSTAT) 0.4 MG SL tablet Place 1 tablet (0.4 mg total) under the tongue every 5 (five) minutes as needed for chest pain. To ER if no resolution 25 tablet 3  . rosuvastatin (CRESTOR) 40 MG tablet Take 1 tablet (40 mg total) by mouth daily. 90 tablet 0  . testosterone (ANDROGEL) 50 MG/5GM (1%) GEL 8 pumps daily 2 Package 0  . Testosterone 12.5 MG/ACT (1%) GEL Apply 100 mg topically daily. 300 g 0  . Vitamin D, Ergocalciferol, (DRISDOL) 50000 units CAPS capsule Take 1 capsule (50,000 Units total) by mouth every 7 (seven) days. 4 capsule 4   No facility-administered medications prior to visit.     Allergies  Allergen Reactions  . Atorvastatin Other (See Comments)    Caused leg pain  . Penicillins Swelling and Rash    Has patient had a PCN  reaction causing immediate rash, facial/tongue/throat swelling, SOB or lightheadedness with hypotension: Yes Has patient had a PCN reaction causing severe rash involving mucus membranes or skin necrosis: No Has patient had a PCN reaction that required hospitalization: Yes Has patient had a PCN reaction occurring  within the last 10 years: No If all of the above answers are "NO", then may proceed with Cephalosporin use.     Review of Systems  Constitutional: Negative for fever and malaise/fatigue.  HENT: Negative for congestion.   Eyes: Negative for blurred vision.  Respiratory: Negative for shortness of breath.   Cardiovascular: Negative for chest pain, palpitations and leg swelling.  Gastrointestinal: Negative for abdominal pain, blood in stool and nausea.  Genitourinary: Negative for dysuria and frequency.  Musculoskeletal: Positive for back pain. Negative for falls.  Skin: Negative for rash.  Neurological: Negative for dizziness, loss of consciousness and headaches.  Endo/Heme/Allergies: Negative for environmental allergies.  Psychiatric/Behavioral: Negative for depression. The patient is not nervous/anxious.        Objective:    Physical Exam Vitals signs and nursing note reviewed.  Constitutional:      Appearance: Normal appearance. He is not ill-appearing.  HENT:     Head: Normocephalic and atraumatic.     Right Ear: External ear normal.     Left Ear: External ear normal.     Nose: Nose normal.  Eyes:     General:        Right eye: No discharge.        Left eye: No discharge.  Pulmonary:     Effort: Pulmonary effort is normal.  Neurological:     Mental Status: He is alert and oriented to person, place, and time.  Psychiatric:        Mood and Affect: Mood normal.        Behavior: Behavior normal.     Wt 190 lb (86.2 kg)   BMI 28.06 kg/m  Wt Readings from Last 3 Encounters:  08/10/19 190 lb (86.2 kg)  08/25/18 189 lb (85.7 kg)  02/17/18 185 lb 12.8 oz (84.3  kg)    Diabetic Foot Exam - Simple   No data filed     Lab Results  Component Value Date   WBC 8.1 02/25/2019   HGB 14.0 02/25/2019   HCT 40.8 02/25/2019   PLT 182.0 02/25/2019   GLUCOSE 91 02/25/2019   CHOL 118 02/25/2019   TRIG 226.0 (H) 02/25/2019   HDL 37.30 (L) 02/25/2019   LDLDIRECT 61.0 02/25/2019   LDLCALC 43 08/25/2018   ALT 32 02/25/2019   AST 24 02/25/2019   NA 140 02/25/2019   K 4.0 02/25/2019   CL 102 02/25/2019   CREATININE 0.87 02/25/2019   BUN 17 02/25/2019   CO2 30 02/25/2019   TSH 2.11 02/25/2019   INR 0.97 07/02/2017   HGBA1C 6.2 02/25/2019   MICROALBUR 0.7 08/25/2018    Lab Results  Component Value Date   TSH 2.11 02/25/2019   Lab Results  Component Value Date   WBC 8.1 02/25/2019   HGB 14.0 02/25/2019   HCT 40.8 02/25/2019   MCV 87.0 02/25/2019   PLT 182.0 02/25/2019   Lab Results  Component Value Date   NA 140 02/25/2019   K 4.0 02/25/2019   CO2 30 02/25/2019   GLUCOSE 91 02/25/2019   BUN 17 02/25/2019   CREATININE 0.87 02/25/2019   BILITOT 0.4 02/25/2019   ALKPHOS 64 02/25/2019   AST 24 02/25/2019   ALT 32 02/25/2019   PROT 6.8 02/25/2019   ALBUMIN 4.5 02/25/2019   CALCIUM 9.4 02/25/2019   ANIONGAP 7 07/03/2017   GFR 92.95 02/25/2019   Lab Results  Component Value Date   CHOL 118 02/25/2019   Lab Results  Component Value Date  HDL 37.30 (L) 02/25/2019   Lab Results  Component Value Date   LDLCALC 43 08/25/2018   Lab Results  Component Value Date   TRIG 226.0 (H) 02/25/2019   Lab Results  Component Value Date   CHOLHDL 3 02/25/2019   Lab Results  Component Value Date   HGBA1C 6.2 02/25/2019       Assessment & Plan:   Problem List Items Addressed This Visit    Hyperlipidemia    Tolerating statin, encouraged heart healthy diet, avoid trans fats, minimize simple carbs and saturated fats. Increase exercise as tolerated      Relevant Medications   nitroGLYCERIN (NITROSTAT) 0.4 MG SL tablet    rosuvastatin (CRESTOR) 40 MG tablet   metoprolol tartrate (LOPRESSOR) 25 MG tablet   Back pain    Working full time and managing his pain well with current meds. No changes there therapy at this time.       Relevant Medications   HYDROcodone-acetaminophen (NORCO) 10-325 MG tablet   Hypogonadism in male    Supplement and monitor. No concerning side effects noted.      Essential hypertension    Monitor vitals weekly, no changes to meds. Encouraged heart healthy diet such as the DASH diet and exercise as tolerated.       Relevant Medications   nitroGLYCERIN (NITROSTAT) 0.4 MG SL tablet   rosuvastatin (CRESTOR) 40 MG tablet   metoprolol tartrate (LOPRESSOR) 25 MG tablet   Other Relevant Orders   CBC   Comprehensive metabolic panel   TSH   Coronary artery disease involving native coronary artery of native heart with angina pectoris ([place])    Is feeling well but has been lost to cardiology follow up with the movement back to [place] of his cardiologist. Will refer back to cardiology      Relevant Medications   clopidogrel (PLAVIX) 75 MG tablet   nitroGLYCERIN (NITROSTAT) 0.4 MG SL tablet   rosuvastatin (CRESTOR) 40 MG tablet   metoprolol tartrate (LOPRESSOR) 25 MG tablet   Other Relevant Orders   Ambulatory referral to Cardiology   Hyperglycemia    hgba1c acceptable, minimize simple carbs. Increase exercise as tolerated.       Relevant Orders   Hemoglobin A1c    Other Visit Diagnoses    Nocturia    -  Primary   Relevant Orders   PSA   Testosterone   Anxiety and depression       Relevant Medications   ALPRAZolam (XANAX) 1 MG tablet   Tobacco abuse       Hyperlipidemia, mixed       Relevant Medications   nitroGLYCERIN (NITROSTAT) 0.4 MG SL tablet   rosuvastatin (CRESTOR) 40 MG tablet   metoprolol tartrate (LOPRESSOR) 25 MG tablet   Other Relevant Orders   Ambulatory referral to Cardiology   Lipid panel   High risk medication use       Relevant Orders   Pain Mgmt,  Profile 8 w/Conf, U      I have changed Elijah Marquez's Vitamin D (Ergocalciferol). I am also having him maintain his aspirin, HYDROcodone-acetaminophen, clopidogrel, nitroGLYCERIN, rosuvastatin, metoprolol tartrate, ALPRAZolam, and Testosterone.  Meds ordered this encounter  Medications  . HYDROcodone-acetaminophen (NORCO) 10-325 MG tablet    Sig: Take 1 tablet by mouth every 6 (six) hours as needed.    Dispense:  120 tablet    Refill:  0  . clopidogrel (PLAVIX) 75 MG tablet    Sig: Take 1 tablet (75 mg total) by  mouth daily.    Dispense:  90 tablet    Refill:  1  . Vitamin D, Ergocalciferol, (DRISDOL) 1.25 MG (50000 UT) CAPS capsule    Sig: Take 1 capsule (50,000 Units total) by mouth every 7 (seven) days.    Dispense:  4 capsule    Refill:  4  . nitroGLYCERIN (NITROSTAT) 0.4 MG SL tablet    Sig: Place 1 tablet (0.4 mg total) under the tongue every 5 (five) minutes as needed for chest pain. To ER if no resolution    Dispense:  25 tablet    Refill:  3  . rosuvastatin (CRESTOR) 40 MG tablet    Sig: Take 1 tablet (40 mg total) by mouth daily.    Dispense:  90 tablet    Refill:  0    Patient needs follow up appointment for future refills.  . metoprolol tartrate (LOPRESSOR) 25 MG tablet    Sig: Take 1 tablet (25 mg total) by mouth 2 (two) times daily.    Dispense:  180 tablet    Refill:  1  . ALPRAZolam (XANAX) 1 MG tablet    Sig: Take 1 tablet (1 mg total) by mouth 2 (two) times daily as needed for sleep or anxiety.    Dispense:  60 tablet    Refill:  2  . Testosterone 12.5 MG/ACT (1%) GEL    Sig: Apply 100 mg topically daily.    Dispense:  300 g    Refill:  0     I discussed the assessment and treatment plan with the patient. The patient was provided an opportunity to ask questions and all were answered. The patient agreed with the plan and demonstrated an understanding of the instructions.   The patient was advised to call back or seek an in-person evaluation if the  symptoms worsen or if the condition fails to improve as anticipated.  I provided 25 minutes of non-face-to-face time during this encounter.   Penni Homans, MD

## 2019-08-12 ENCOUNTER — Telehealth: Payer: Self-pay | Admitting: Family Medicine

## 2019-08-12 NOTE — Telephone Encounter (Signed)
LM to schedule lab appt next week and f/u visit in 6 months with Dr. Charlett Blake

## 2019-08-19 ENCOUNTER — Other Ambulatory Visit: Payer: Self-pay

## 2019-08-19 ENCOUNTER — Other Ambulatory Visit (INDEPENDENT_AMBULATORY_CARE_PROVIDER_SITE_OTHER): Payer: BC Managed Care – PPO

## 2019-08-19 DIAGNOSIS — R739 Hyperglycemia, unspecified: Secondary | ICD-10-CM | POA: Diagnosis not present

## 2019-08-19 DIAGNOSIS — E782 Mixed hyperlipidemia: Secondary | ICD-10-CM | POA: Diagnosis not present

## 2019-08-19 DIAGNOSIS — Z79899 Other long term (current) drug therapy: Secondary | ICD-10-CM

## 2019-08-19 DIAGNOSIS — I10 Essential (primary) hypertension: Secondary | ICD-10-CM

## 2019-08-19 DIAGNOSIS — R351 Nocturia: Secondary | ICD-10-CM | POA: Diagnosis not present

## 2019-08-20 LAB — CBC
HCT: 40.2 % (ref 39.0–52.0)
Hemoglobin: 13.7 g/dL (ref 13.0–17.0)
MCHC: 34.2 g/dL (ref 30.0–36.0)
MCV: 87 fl (ref 78.0–100.0)
Platelets: 196 10*3/uL (ref 150.0–400.0)
RBC: 4.62 Mil/uL (ref 4.22–5.81)
RDW: 12.4 % (ref 11.5–15.5)
WBC: 7.4 10*3/uL (ref 4.0–10.5)

## 2019-08-20 LAB — LIPID PANEL
Cholesterol: 106 mg/dL (ref 0–200)
HDL: 32.9 mg/dL — ABNORMAL LOW (ref >39.00)
NonHDL: 73.28
Total CHOL/HDL Ratio: 3
Triglycerides: 308 mg/dL — ABNORMAL HIGH (ref 0.0–149.0)
VLDL: 61.6 mg/dL — ABNORMAL HIGH (ref 0.0–40.0)

## 2019-08-20 LAB — COMPREHENSIVE METABOLIC PANEL
ALT: 31 U/L (ref 0–53)
AST: 22 U/L (ref 0–37)
Albumin: 4.3 g/dL (ref 3.5–5.2)
Alkaline Phosphatase: 67 U/L (ref 39–117)
BUN: 14 mg/dL (ref 6–23)
CO2: 30 mEq/L (ref 19–32)
Calcium: 9.2 mg/dL (ref 8.4–10.5)
Chloride: 102 mEq/L (ref 96–112)
Creatinine, Ser: 0.9 mg/dL (ref 0.40–1.50)
GFR: 89.21 mL/min (ref >60.00)
Glucose, Bld: 106 mg/dL — ABNORMAL HIGH (ref 70–99)
Potassium: 3.9 mEq/L (ref 3.5–5.1)
Sodium: 139 mEq/L (ref 135–145)
Total Bilirubin: 0.3 mg/dL (ref 0.2–1.2)
Total Protein: 6.6 g/dL (ref 6.0–8.3)

## 2019-08-20 LAB — HEMOGLOBIN A1C: Hgb A1c MFr Bld: 6.1 % (ref 4.6–6.5)

## 2019-08-20 LAB — TESTOSTERONE: Testosterone: 166 ng/dL — ABNORMAL LOW (ref 300.00–890.00)

## 2019-08-20 LAB — TSH: TSH: 3.08 u[IU]/mL (ref 0.35–4.50)

## 2019-08-20 LAB — LDL CHOLESTEROL, DIRECT: Direct LDL: 47 mg/dL

## 2019-08-20 LAB — PSA: PSA: 0.31 ng/mL (ref 0.10–4.00)

## 2019-08-21 LAB — PAIN MGMT, PROFILE 8 W/CONF, U
6 Acetylmorphine: NEGATIVE ng/mL
Alcohol Metabolites: NEGATIVE ng/mL (ref <500)
Alphahydroxyalprazolam: 59 ng/mL
Alphahydroxymidazolam: NEGATIVE ng/mL
Alphahydroxytriazolam: NEGATIVE ng/mL
Aminoclonazepam: NEGATIVE ng/mL
Amphetamines: NEGATIVE ng/mL
Benzodiazepines: POSITIVE ng/mL
Buprenorphine, Urine: NEGATIVE ng/mL
Cocaine Metabolite: NEGATIVE ng/mL
Codeine: NEGATIVE ng/mL
Creatinine: 63.1 mg/dL
Hydrocodone: 1964 ng/mL
Hydromorphone: 199 ng/mL
Hydroxyethylflurazepam: NEGATIVE ng/mL
Lorazepam: NEGATIVE ng/mL
MDMA: NEGATIVE ng/mL
Marijuana Metabolite: NEGATIVE ng/mL
Morphine: NEGATIVE ng/mL
Nordiazepam: NEGATIVE ng/mL
Norhydrocodone: 1981 ng/mL
Opiates: POSITIVE ng/mL
Oxazepam: NEGATIVE ng/mL
Oxidant: NEGATIVE ug/mL
Oxycodone: NEGATIVE ng/mL
Temazepam: NEGATIVE ng/mL
pH: 5.1 (ref 4.5–9.0)

## 2019-09-05 NOTE — Progress Notes (Signed)
Cardiology Office Note:    Date:  09/06/2019   ID:  Elijah Marquez, DOB 1971-08-05, MRN [id number]  PCP:  Elijah Lukes, MD  Cardiologist:  Berniece Salines, DO  Electrophysiologist:  None   Referring MD: Elijah Lukes, MD   The patient was referred by his pcp  History of Present Illness:    Elijah Marquez is a 48 y.o. male with a hx of hyperlipidemia, CAD status post PCI in 2018 with DES to the LAD, former smoker and hyperlipidemia present for a follow up visit. The patient  Was last seen by Dr. Bettina Marquez in November 2018. He notes that he has has been lost to cardiovascular follow up but tries to he his pcp on a regular basis. He denies any chest pain, shortness of breath, nausea or vomitng, and palpitations. However he did report that he has been experiencing intermittent calf and leg pain when he walks. He notes that it is starting to bother home.   No other complaints at this time.    Past Medical History:  Diagnosis Date  . Anxiety 08/23/2012  . Anxiety as acute reaction to exceptional stress 09/20/2012  . Chicken pox as a child  . Chronic lower back pain   . Fatigue   . History of tobacco abuse    Failed Chantix and wellbutrin   . Hyperlipidemia 08/23/2012  . Hypogonadism in male 10/02/2014  . Intermittent claudication (HCC) 08/13/2015   B/l legs  . Neck pain on left side 08/23/2012  . Osteoarthritis    "all over" (07/02/2017)  . Osteoarthritis of both knees   . Tobacco abuse     Past Surgical History:  Procedure Laterality Date  . ANTERIOR CERVICAL DECOMP/DISCECTOMY FUSION  12/08/2012   C4,5,,6  . BACK SURGERY    . CORONARY ANGIOPLASTY WITH STENT PLACEMENT  07/02/2017   "1 stent"  . CORONARY STENT INTERVENTION N/A 07/02/2017   Procedure: CORONARY STENT INTERVENTION;  Surgeon: Troy Sine, MD;  Location: [facility];  Service: Cardiovascular;  Laterality: N/A;  LAD/DIAG  . KNEE ARTHROSCOPY Bilateral 2011-2012   left-right  . LEFT HEART CATH AND CORONARY  ANGIOGRAPHY N/A 07/02/2017   Procedure: Left Heart Cath and Coronary Angiography;  Surgeon: Troy Sine, MD;  Location: [facility];  Service: Cardiovascular;  Laterality: N/A;  . MULTIPLE TOOTH EXTRACTIONS    . WISDOM TOOTH EXTRACTION      Current Medications: Current Meds  Medication Sig  . ALPRAZolam (XANAX) 1 MG tablet Take 1 tablet (1 mg total) by mouth 2 (two) times daily as needed for sleep or anxiety.  Marland Kitchen aspirin 81 MG tablet Take 1 tablet (81 mg total) by mouth daily.  . clopidogrel (PLAVIX) 75 MG tablet Take 1 tablet (75 mg total) by mouth daily.  Marland Kitchen HYDROcodone-acetaminophen (NORCO) 10-325 MG tablet Take 1 tablet by mouth every 6 (six) hours as needed.  . metoprolol tartrate (LOPRESSOR) 25 MG tablet Take 1 tablet (25 mg total) by mouth 2 (two) times daily.  . nitroGLYCERIN (NITROSTAT) 0.4 MG SL tablet Place 1 tablet (0.4 mg total) under the tongue every 5 (five) minutes as needed for chest pain. To ER if no resolution  . rosuvastatin (CRESTOR) 40 MG tablet Take 1 tablet (40 mg total) by mouth daily.  . Testosterone 12.5 MG/ACT (1%) GEL Apply 100 mg topically daily.  . Vitamin D, Ergocalciferol, (DRISDOL) 1.25 MG (50000 UT) CAPS capsule Take 1 capsule (50,000 Units total) by mouth every 7 (seven) days.  Allergies:   Atorvastatin and Penicillins   Social History   Socioeconomic History  . Marital status: Married    Spouse name: Not on file  . Number of children: Not on file  . Years of education: Not on file  . Highest education level: Not on file  Occupational History  . Not on file  Social Needs  . Financial resource strain: Not on file  . Food insecurity    Worry: Not on file    Inability: Not on file  . Transportation needs    Medical: Not on file    Non-medical: Not on file  Tobacco Use  . Smoking status: Former Smoker    Packs/day: 1.50    Years: 35.00    Pack years: 52.50    Types: Cigarettes    Quit date: 07/31/2015    Years since quitting: 4.1   . Smokeless tobacco: Never Used  Substance and Sexual Activity  . Alcohol use: No  . Drug use: No  . Sexual activity: Not Currently    Partners: Female  Lifestyle  . Physical activity    Days per week: Not on file    Minutes per session: Not on file  . Stress: Not on file  Relationships  . Social Herbalist on phone: Not on file    Gets together: Not on file    Attends religious service: Not on file    Active member of club or organization: Not on file    Attends meetings of clubs or organizations: Not on file    Relationship status: Not on file  Other Topics Concern  . Not on file  Social History Narrative  . Not on file     Family History: The patient's family history includes Alcohol abuse in his father; Coronary artery disease in his mother; Emphysema in his sister; Fibromyalgia in his mother and sister; Heart attack in his mother; Heart disease in his mother and sister; Hypertension in his mother and sister; Kidney disease in his daughter; Lupus in his sister; Other in his son; Peripheral vascular disease (age of onset: 67) in his mother; Seizures in his son.  ROS:   Review of Systems  Constitution: Negative for decreased appetite, fever and weight gain.  HENT: Negative for congestion, ear discharge, hoarse voice and sore throat.   Eyes: Negative for discharge, redness, vision loss in right eye and visual halos.  Cardiovascular: Negative for chest pain, dyspnea on exertion, leg swelling, orthopnea and palpitations.  Respiratory: Negative for cough, hemoptysis, shortness of breath and snoring.   Endocrine: Negative for heat intolerance and polyphagia.  Hematologic/Lymphatic: Negative for bleeding problem. Does not bruise/bleed easily.  Skin: Negative for flushing, nail changes, rash and suspicious lesions.  Musculoskeletal: Reports leg pain.  Negative for arthritis, joint pain, muscle cramps, myalgias, neck pain and stiffness.  Gastrointestinal: Negative for  abdominal pain, bowel incontinence, diarrhea and excessive appetite.  Genitourinary: Negative for decreased libido, genital sores and incomplete emptying.  Neurological: Negative for brief paralysis, focal weakness, headaches and loss of balance.  Psychiatric/Behavioral: Negative for altered mental status, depression and suicidal ideas.  Allergic/Immunologic: Negative for HIV exposure and persistent infections.    EKGs/Labs/Other Studies Reviewed:    The following studies were reviewed today:   EKG:  The ekg ordered today demonstrates sinus rhythm, heart rate heart rate 63 bpm with occasional PVCs, prior to EKG done in November 2018 PVCs are new.  LHC 07/2017 Conclusion   Mid LAD-2 lesion, 99 %  stenosed.  Ost RCA to Prox RCA lesion, 40 %stenosed.  The left ventricular systolic function is normal.  LV end diastolic pressure is normal.  A STENT SYNERGY DES 2.75X16 drug eluting stent was successfully placed.  Mid LAD-1 lesion, 80 %stenosed.  Post intervention, there is a 0% residual stenosis.  Ost 2nd Diag to 2nd Diag lesion, 80 %stenosed.  Post intervention, there is a 20% residual stenosis.   Preserved global LV function with a small area of minimal mid anterolateral hypocontractility and overall ejection fraction at approximately 55%.  Two vessel CAD with complex bifurcation stenosis involving the LAD and diagonal vessel with 80% stenosis in the LAD extending into the diagonal ostium with 99% stenosis in the LAD immediately after the done at the diagonal takeoff and 40% proximal smooth RCA stenosis and a small caliber RCA vessel.  Successful PCI to the complex bifurcation stenosis utilizing cutting balloon atherotomy to the LAD and diagonal vessel with ultimate DES stenting with insertion of a 2.7516 mm Synergy stent in the LAD, dilated to 2.8, 2 mm, and ultimate PTCA through the stent strut to the ostium of the the diagonal vessel with the 80% diagonal stenoses being  reduced to 20%, and a 99% stenosis being reduced to 0%, before the last diagonal dilatation and evidence for mild plaque shifting post Diagonal PTCA into the LAD with narrowing of 10%.    Recent Labs: 08/19/2019: ALT 31; BUN 14; Creatinine, Ser 0.90; Hemoglobin 13.7; Platelets 196.0; Potassium 3.9; Sodium 139; TSH 3.08  Recent Lipid Panel    Component Value Date/Time   CHOL 106 08/19/2019 1549   TRIG 308.0 (H) 08/19/2019 1549   HDL 32.90 (L) 08/19/2019 1549   CHOLHDL 3 08/19/2019 1549   VLDL 61.6 (H) 08/19/2019 1549   LDLCALC 43 08/25/2018 1632   LDLDIRECT 47.0 08/19/2019 1549    Physical Exam:    VS:  BP 120/86 (BP Location: Left Arm, Patient Position: Sitting, Cuff Size: Normal)   Pulse (!) 56   Temp 98.2 F (36.8 C)   Ht 5\' 9"  (1.753 m)   Wt 189 lb (85.7 kg)   SpO2 95%   BMI 27.91 kg/m     Wt Readings from Last 3 Encounters:  09/06/19 189 lb (85.7 kg)  08/10/19 190 lb (86.2 kg)  08/25/18 189 lb (85.7 kg)     GEN: Well nourished, well developed in no acute distress HEENT: Normal NECK: No JVD; No carotid bruits LYMPHATICS: No lymphadenopathy CARDIAC: S1S2 noted,RRR, no murmurs, rubs, gallops RESPIRATORY:  Clear to auscultation without rales, wheezing or rhonchi  ABDOMEN: Soft, non-tender, non-distended, +bowel sounds, no guarding. EXTREMITIES: No edema, No cyanosis, no clubbing MUSCULOSKELETAL:  No edema; No deformity  SKIN: Warm and dry, with tattos. NEUROLOGIC:  Alert and oriented x 3, non-focal PSYCHIATRIC:  Normal affect, good insight  ASSESSMENT:    1. Claudication ([place])   2. Coronary artery disease involving native coronary artery of native heart with angina pectoris ([place])   3. Mixed hyperlipidemia   4. History of tobacco abuse   5. Essential hypertension   6. Status post coronary artery stent placement   7. Former smoker   8. PVC (premature ventricular contraction)   9. Hypertriglyceridemia    PLAN:    1. The patient's complaint of leg pain is  concern for intermittent claudications. I will order Ankle-brachial index for now.   2. He is still on his DAPT, and has completed 12 months of uninterrupted DAPT post his DES in 2018. For  now we will continue his DAPT given his high probability for peripheral vascular disease.  If testing does not show evidence of PVD his Plavix will be discontinued.  3. Continue his current medication regimen for now.  He recently had blood work 08/19/2019 ordered by is pcp and this was also reviewed with the patient today in the office. He was advised of his elevated triglycerides. He will continue on his statin and lifestyle if his repeat triglycerides stays above 150 I will start him on Vascepa.   4. EKG shows occasional PVC, for now we will continue to monitor.  The patient is in agreement with the above plan. The patient left the office in stable condition.  The patient will follow up in 3 months.   Medication Adjustments/Labs and Tests Ordered: Current medicines are reviewed at length with the patient today.  Concerns regarding medicines are outlined above.  Orders Placed This Encounter  Procedures  . VAS [place] ABI WITH/WO TBI   No orders of the defined types were placed in this encounter.   Patient Instructions  Medication Instructions:  Your physician recommends that you continue on your current medications as directed. Please refer to the Current Medication list given to you today.  If you need a refill on your cardiac medications before your next appointment, please call your pharmacy.   Lab work: None If you have labs (blood work) drawn today and your tests are completely normal, you will receive your results only by: Marland Kitchen MyChart Message (if you have MyChart) OR . A paper copy in the mail If you have any lab test that is abnormal or we need to change your treatment, we will call you to review the results.  Testing/Procedures: Your physician has requested that you have an ankle brachial index  (ABI). During this test an ultrasound and blood pressure cuff are used to evaluate the arteries that supply the arms and legs with blood. Allow thirty minutes for this exam. There are no restrictions or special instructions.   Follow-Up: At [facility], you and your health needs are our priority.  As part of our continuing mission to provide you with exceptional heart care, we have created designated Provider Care Teams.  These Care Teams include your primary Cardiologist (physician) and Advanced Practice Providers (APPs -  Physician Assistants and Nurse Practitioners) who all work together to provide you with the care you need, when you need it. You will need a follow up appointment in 3 months with Dr Harriet Masson Any Other Special Instructions Will Be Listed Below (If Applicable).        Adopting a Healthy Lifestyle.  Know what a healthy weight is for you (roughly BMI <25) and aim to maintain this   Aim for 7+ servings of fruits and vegetables daily   65-80+ fluid ounces of water or unsweet tea for healthy kidneys   Limit to max 1 drink of alcohol per day; avoid smoking/tobacco   Limit animal fats in diet for cholesterol and heart health - choose grass fed whenever available   Avoid highly processed foods, and foods high in saturated/trans fats   Aim for low stress - take time to unwind and care for your mental health   Aim for 150 min of moderate intensity exercise weekly for heart health, and weights twice weekly for bone health   Aim for 7-9 hours of sleep daily   When it comes to diets, agreement about the perfect plan isnt easy to find, even among  the experts. Experts at the [employer] developed an idea known as the Healthy Eating Plate. Just imagine a plate divided into logical, healthy portions.   The emphasis is on diet quality:   Load up on vegetables and fruits - one-half of your plate: Aim for color and variety, and remember that potatoes dont  count.   Go for whole grains - one-quarter of your plate: Whole wheat, barley, wheat berries, quinoa, oats, brown rice, and foods made with them. If you want pasta, go with whole wheat pasta.   Protein power - one-quarter of your plate: Fish, chicken, beans, and nuts are all healthy, versatile protein sources. Limit red meat.   The diet, however, does go beyond the plate, offering a few other suggestions.   Use healthy plant oils, such as olive, canola, soy, corn, sunflower and peanut. Check the labels, and avoid partially hydrogenated oil, which have unhealthy trans fats.   If youre thirsty, drink water. Coffee and tea are good in moderation, but skip sugary drinks and limit milk and dairy products to one or two daily servings.   The type of carbohydrate in the diet is more important than the amount. Some sources of carbohydrates, such as vegetables, fruits, whole grains, and beans-are healthier than others.   Finally, stay active  Signed, Berniece Salines, DO  09/06/2019 10:07 PM    [place]

## 2019-09-06 ENCOUNTER — Encounter: Payer: Self-pay | Admitting: Cardiology

## 2019-09-06 ENCOUNTER — Other Ambulatory Visit: Payer: Self-pay

## 2019-09-06 ENCOUNTER — Ambulatory Visit (INDEPENDENT_AMBULATORY_CARE_PROVIDER_SITE_OTHER): Payer: BC Managed Care – PPO | Admitting: Cardiology

## 2019-09-06 VITALS — BP 120/86 | HR 56 | Temp 98.2°F | Ht 69.0 in | Wt 189.0 lb

## 2019-09-06 DIAGNOSIS — I25119 Atherosclerotic heart disease of native coronary artery with unspecified angina pectoris: Secondary | ICD-10-CM

## 2019-09-06 DIAGNOSIS — Z955 Presence of coronary angioplasty implant and graft: Secondary | ICD-10-CM

## 2019-09-06 DIAGNOSIS — I739 Peripheral vascular disease, unspecified: Secondary | ICD-10-CM

## 2019-09-06 DIAGNOSIS — E782 Mixed hyperlipidemia: Secondary | ICD-10-CM | POA: Diagnosis not present

## 2019-09-06 DIAGNOSIS — I10 Essential (primary) hypertension: Secondary | ICD-10-CM | POA: Diagnosis not present

## 2019-09-06 DIAGNOSIS — E781 Pure hyperglyceridemia: Secondary | ICD-10-CM

## 2019-09-06 DIAGNOSIS — I493 Ventricular premature depolarization: Secondary | ICD-10-CM

## 2019-09-06 DIAGNOSIS — Z87891 Personal history of nicotine dependence: Secondary | ICD-10-CM

## 2019-09-06 NOTE — Patient Instructions (Signed)
Medication Instructions:  Your physician recommends that you continue on your current medications as directed. Please refer to the Current Medication list given to you today.  If you need a refill on your cardiac medications before your next appointment, please call your pharmacy.   Lab work: None If you have labs (blood work) drawn today and your tests are completely normal, you will receive your results only by: Marland Kitchen MyChart Message (if you have MyChart) OR . A paper copy in the mail If you have any lab test that is abnormal or we need to change your treatment, we will call you to review the results.  Testing/Procedures: Your physician has requested that you have an ankle brachial index (ABI). During this test an ultrasound and blood pressure cuff are used to evaluate the arteries that supply the arms and legs with blood. Allow thirty minutes for this exam. There are no restrictions or special instructions.   Follow-Up: At [facility], you and your health needs are our priority.  As part of our continuing mission to provide you with exceptional heart care, we have created designated Provider Care Teams.  These Care Teams include your primary Cardiologist (physician) and Advanced Practice Providers (APPs -  Physician Assistants and Nurse Practitioners) who all work together to provide you with the care you need, when you need it. You will need a follow up appointment in 3 months with Dr Harriet Masson Any Other Special Instructions Will Be Listed Below (If Applicable).

## 2019-09-08 ENCOUNTER — Other Ambulatory Visit: Payer: Self-pay | Admitting: Family Medicine

## 2019-09-09 MED ORDER — HYDROCODONE-ACETAMINOPHEN 10-325 MG PO TABS: 1.0000 | ORAL_TABLET | Freq: Four times a day (QID) | ORAL | 0 refills | Status: DC | PRN

## 2019-09-09 NOTE — Telephone Encounter (Signed)
Requesting:Norco Contract:01/22/2018 UDS:08/19/2019 Last Visit:08/10/2019 Next Visit:01/31/2020 Last Refill:08/10/2019 #120  Please Advise

## 2019-09-09 NOTE — Addendum Note (Signed)
Addended by: Jerl Santos R on: 09/09/2019 09:05 AM   Modules accepted: Orders

## 2019-09-13 ENCOUNTER — Ambulatory Visit (HOSPITAL_BASED_OUTPATIENT_CLINIC_OR_DEPARTMENT_OTHER)
Admission: RE | Admit: 2019-09-13 | Discharge: 2019-09-13 | Disposition: A | Discharge status: Home / Self Care | Payer: BC Managed Care – PPO | Source: Ambulatory Visit | Attending: Cardiology | Admitting: Cardiology

## 2019-09-13 ENCOUNTER — Other Ambulatory Visit: Payer: Self-pay

## 2019-09-13 DIAGNOSIS — I739 Peripheral vascular disease, unspecified: Secondary | ICD-10-CM | POA: Diagnosis not present

## 2019-09-13 DIAGNOSIS — Z87891 Personal history of nicotine dependence: Secondary | ICD-10-CM | POA: Insufficient documentation

## 2019-09-13 NOTE — Progress Notes (Signed)
ABI Doppler performed    Cardell Peach 09/13/2019, 4:04 PM

## 2019-09-16 ENCOUNTER — Telehealth: Payer: Self-pay | Admitting: Emergency Medicine

## 2019-09-16 NOTE — Telephone Encounter (Signed)
Left message for patient to return call regarding results  

## 2019-09-16 NOTE — Telephone Encounter (Signed)
Called patient back and informed him of results.  

## 2019-09-16 NOTE — Telephone Encounter (Signed)
Please call patient back regarding results.

## 2019-10-10 ENCOUNTER — Other Ambulatory Visit: Payer: Self-pay | Admitting: Family Medicine

## 2019-10-11 MED ORDER — HYDROCODONE-ACETAMINOPHEN 10-325 MG PO TABS: 1.0000 | ORAL_TABLET | Freq: Four times a day (QID) | ORAL | 0 refills | Status: DC | PRN

## 2019-10-11 NOTE — Telephone Encounter (Signed)
Requesting:Norco Contract:01/22/2018 UDS:08/19/2019 Last Visit:08/10/2019 Next Visit:01/31/2020 Last Refill:09/09/2019  Please Advise

## 2019-11-08 ENCOUNTER — Other Ambulatory Visit: Payer: Self-pay | Admitting: Family Medicine

## 2019-11-09 MED ORDER — HYDROCODONE-ACETAMINOPHEN 10-325 MG PO TABS: 1.0000 | ORAL_TABLET | Freq: Four times a day (QID) | ORAL | 0 refills | Status: DC | PRN

## 2019-11-09 NOTE — Telephone Encounter (Signed)
Requesting:Norco Contract:01/22/2018 UDS:08/19/2019 Last Visit:08/10/19 Next Visit:01/31/2020 Last Refill:10/11/2019  Please Advise

## 2019-11-30 ENCOUNTER — Other Ambulatory Visit: Payer: Self-pay | Admitting: Family Medicine

## 2019-12-09 ENCOUNTER — Other Ambulatory Visit: Payer: Self-pay | Admitting: Family Medicine

## 2019-12-10 MED ORDER — HYDROCODONE-ACETAMINOPHEN 10-325 MG PO TABS: 1.0000 | ORAL_TABLET | Freq: Four times a day (QID) | ORAL | 0 refills | Status: DC | PRN

## 2019-12-10 NOTE — Telephone Encounter (Signed)
Requesting: Norco  Contract: yes UDS: low risk next screen  Last OV:08/10/19 Next OV:01/31/20 Last Refill:11/09/19  #[id number] Database:   Please advise

## 2020-01-10 ENCOUNTER — Other Ambulatory Visit: Payer: Self-pay | Admitting: Family Medicine

## 2020-01-10 MED ORDER — HYDROCODONE-ACETAMINOPHEN 10-325 MG PO TABS: 1.0000 | ORAL_TABLET | Freq: Four times a day (QID) | ORAL | 0 refills | Status: DC | PRN

## 2020-01-10 NOTE — Telephone Encounter (Signed)
Requesting:Norco Contract:01/22/2018 UDS:08/19/19 Last Visit:08/10/2019 Next Visit:01/31/2020 Last Refill:12/10/2019  Please Advise

## 2020-01-31 ENCOUNTER — Ambulatory Visit: Payer: BC Managed Care – PPO | Admitting: Family Medicine

## 2020-02-07 ENCOUNTER — Other Ambulatory Visit: Payer: Self-pay | Admitting: Family Medicine

## 2020-02-07 DIAGNOSIS — F32A Depression, unspecified: Secondary | ICD-10-CM

## 2020-02-07 DIAGNOSIS — F419 Anxiety disorder, unspecified: Secondary | ICD-10-CM

## 2020-02-07 DIAGNOSIS — F329 Major depressive disorder, single episode, unspecified: Secondary | ICD-10-CM

## 2020-02-07 MED ORDER — HYDROCODONE-ACETAMINOPHEN 10-325 MG PO TABS: 1.0000 | ORAL_TABLET | Freq: Four times a day (QID) | ORAL | 0 refills | Status: DC | PRN

## 2020-02-07 MED ORDER — ALPRAZOLAM 1 MG PO TABS: 1.0000 mg | ORAL_TABLET | Freq: Two times a day (BID) | ORAL | 2 refills | Status: DC | PRN

## 2020-02-07 NOTE — Telephone Encounter (Signed)
Medication: ALPRAZolam (XANAX) 1 MG tablet   Has the patient contacted their pharmacy? No. (If no, request that the patient contact the pharmacy for the refill.) (If yes, when and what did the pharmacy advise?)  Preferred Pharmacy (with phone number or street name): [id number] [address], [place] - [address]  [address], [place] [address]  Phone:  [phone] Fax:  [phone]    Agent: Please be advised that RX refills may take up to 3 business days. We ask that you follow-up with your pharmacy.

## 2020-02-07 NOTE — Telephone Encounter (Signed)
Requesting:xanax Contract:yes UDS:low risk next screen 02/16/20 Last OV:08/10/19 Next OV:02/24/20 Last Refill:08/10/19  #[id number] Database:   Please advise

## 2020-02-10 DIAGNOSIS — Z20822 Contact with and (suspected) exposure to covid-19: Secondary | ICD-10-CM | POA: Diagnosis not present

## 2020-02-15 ENCOUNTER — Other Ambulatory Visit: Payer: Self-pay | Admitting: Family Medicine

## 2020-02-15 DIAGNOSIS — Z20822 Contact with and (suspected) exposure to covid-19: Secondary | ICD-10-CM | POA: Diagnosis not present

## 2020-02-15 MED ORDER — METOPROLOL TARTRATE 25 MG PO TABS: 25.0000 mg | ORAL_TABLET | Freq: Two times a day (BID) | ORAL | 1 refills | Status: DC

## 2020-02-24 ENCOUNTER — Ambulatory Visit (INDEPENDENT_AMBULATORY_CARE_PROVIDER_SITE_OTHER): Payer: BC Managed Care – PPO | Admitting: Family Medicine

## 2020-02-24 ENCOUNTER — Other Ambulatory Visit: Payer: Self-pay

## 2020-02-24 DIAGNOSIS — E291 Testicular hypofunction: Secondary | ICD-10-CM | POA: Diagnosis not present

## 2020-02-24 DIAGNOSIS — I10 Essential (primary) hypertension: Secondary | ICD-10-CM

## 2020-02-24 DIAGNOSIS — F411 Generalized anxiety disorder: Secondary | ICD-10-CM

## 2020-02-24 DIAGNOSIS — E782 Mixed hyperlipidemia: Secondary | ICD-10-CM

## 2020-02-24 DIAGNOSIS — F430 Acute stress reaction: Secondary | ICD-10-CM

## 2020-02-24 DIAGNOSIS — M542 Cervicalgia: Secondary | ICD-10-CM

## 2020-02-24 DIAGNOSIS — R739 Hyperglycemia, unspecified: Secondary | ICD-10-CM

## 2020-02-24 NOTE — Patient Instructions (Signed)
Omron Blood Pressure cuff, upper arm, want BP 100-140/60-90 Pulse oximeter, want oxygen in 90s  Weekly vitals  Take Multivitamin with minerals, selenium Vitamin D 1000-2000 IU daily Probiotic with lactobacillus and bifidophilus Asprin EC 81 mg daily Fish oil or Krill oil caps daily  Melatonin 2-5 mg at bedtime  [email or web address]

## 2020-02-27 NOTE — Assessment & Plan Note (Signed)
Supplement and monitor 

## 2020-02-27 NOTE — Assessment & Plan Note (Signed)
He is doing better and tolerates the Alprazolam as needed.

## 2020-02-27 NOTE — Assessment & Plan Note (Signed)
Encouraged heart healthy diet, increase exercise, avoid trans fats, consider a krill oil cap daily. Tolerating Rosuvastatin 

## 2020-02-27 NOTE — Progress Notes (Signed)
Virtual Visit via Video Note  I connected with Elijah Marquez on 02/24/20 at  3:00 PM EDT by a video enabled telemedicine application and verified that I am speaking with the correct person using two identifiers.  Location: Patient: home Provider: office   I discussed the limitations of evaluation and management by telemedicine and the availability of in person appointments. The patient expressed understanding and agreed to proceed. Marin Roberts, CMA was able to get the patient set back up on a video visit   Subjective:    Patient ID: Elijah Marquez, male    DOB: 07-07-1971, 49 y.o.   MRN: [id number]  Chief Complaint  Patient presents with  . Hypertension    follow up    HPI Patient is in today for follow up on chronic medical concerns. No recent febrile illness or hospitalizations. He is working with [facility] still. No recent acute concerns. He continues to struggle with neck and back pain but manages to stay busy. Denies CP/palp/SOB/HA/congestion/fevers/GI or GU c/o. Taking meds as prescribed  Past Medical History:  Diagnosis Date  . Anxiety 08/23/2012  . Anxiety as acute reaction to exceptional stress 09/20/2012  . Chicken pox as a child  . Chronic lower back pain   . Fatigue   . History of tobacco abuse    Failed Chantix and wellbutrin   . Hyperlipidemia 08/23/2012  . Hypogonadism in male 10/02/2014  . Intermittent claudication (HCC) 08/13/2015   B/l legs  . Neck pain on left side 08/23/2012  . Osteoarthritis    "all over" (07/02/2017)  . Osteoarthritis of both knees   . Tobacco abuse     Past Surgical History:  Procedure Laterality Date  . ANTERIOR CERVICAL DECOMP/DISCECTOMY FUSION  12/08/2012   C4,5,,6  . BACK SURGERY    . CORONARY ANGIOPLASTY WITH STENT PLACEMENT  07/02/2017   "1 stent"  . CORONARY STENT INTERVENTION N/A 07/02/2017   Procedure: CORONARY STENT INTERVENTION;  Surgeon: Troy Sine, MD;  Location: [facility];  Service: Cardiovascular;   Laterality: N/A;  LAD/DIAG  . KNEE ARTHROSCOPY Bilateral 2011-2012   left-right  . LEFT HEART CATH AND CORONARY ANGIOGRAPHY N/A 07/02/2017   Procedure: Left Heart Cath and Coronary Angiography;  Surgeon: Troy Sine, MD;  Location: [facility];  Service: Cardiovascular;  Laterality: N/A;  . MULTIPLE TOOTH EXTRACTIONS    . WISDOM TOOTH EXTRACTION      Family History  Problem Relation Age of Onset  . Fibromyalgia Mother   . Coronary artery disease Mother   . Hypertension Mother   . Heart attack Mother   . Heart disease Mother        CAD  . Peripheral vascular disease Mother 107       hemorrhage from vascular procedure  . Fibromyalgia Sister   . Hypertension Sister   . Heart disease Sister   . Other Son        mildly mentally handicap  . Seizures Son   . Lupus Sister   . Emphysema Sister        smoker  . Kidney disease Daughter   . Alcohol abuse Father     Social History   Socioeconomic History  . Marital status: Married    Spouse name: Not on file  . Number of children: Not on file  . Years of education: Not on file  . Highest education level: Not on file  Occupational History  . Not on file  Tobacco Use  .  Smoking status: Former Smoker    Packs/day: 1.50    Years: 35.00    Pack years: 52.50    Types: Cigarettes    Quit date: 07/31/2015    Years since quitting: 4.5  . Smokeless tobacco: Never Used  Substance and Sexual Activity  . Alcohol use: No  . Drug use: No  . Sexual activity: Not Currently    Partners: Female  Other Topics Concern  . Not on file  Social History Narrative  . Not on file   Social Determinants of Health   Financial Resource Strain:   . Difficulty of Paying Living Expenses:   Food Insecurity:   . Worried About Charity fundraiser in the Last Year:   . Arboriculturist in the Last Year:   Transportation Needs:   . Film/video editor (Medical):   Marland Kitchen Lack of Transportation (Non-Medical):   Physical Activity:   . Days of  Exercise per Week:   . Minutes of Exercise per Session:   Stress:   . Feeling of Stress :   Social Connections:   . Frequency of Communication with Friends and Family:   . Frequency of Social Gatherings with Friends and Family:   . Attends Religious Services:   . Active Member of Clubs or Organizations:   . Attends Archivist Meetings:   Marland Kitchen Marital Status:   Intimate Partner Violence:   . Fear of Current or Ex-Partner:   . Emotionally Abused:   Marland Kitchen Physically Abused:   . Sexually Abused:     Outpatient Medications Prior to Visit  Medication Sig Dispense Refill  . ALPRAZolam (XANAX) 1 MG tablet Take 1 tablet (1 mg total) by mouth 2 (two) times daily as needed for sleep or anxiety. 60 tablet 2  . aspirin 81 MG tablet Take 1 tablet (81 mg total) by mouth daily. 30 tablet   . clopidogrel (PLAVIX) 75 MG tablet Take 1 tablet (75 mg total) by mouth daily. 90 tablet 1  . HYDROcodone-acetaminophen (NORCO) 10-325 MG tablet Take 1 tablet by mouth every 6 (six) hours as needed. 120 tablet 0  . metoprolol tartrate (LOPRESSOR) 25 MG tablet Take 1 tablet (25 mg total) by mouth 2 (two) times daily. 180 tablet 1  . nitroGLYCERIN (NITROSTAT) 0.4 MG SL tablet Place 1 tablet (0.4 mg total) under the tongue every 5 (five) minutes as needed for chest pain. To ER if no resolution 25 tablet 3  . rosuvastatin (CRESTOR) 40 MG tablet TAKE 1 TABLET BY MOUTH EVERY DAY 90 tablet 0  . Testosterone 12.5 MG/ACT (1%) GEL Apply 100 mg topically daily. 300 g 0  . Vitamin D, Ergocalciferol, (DRISDOL) 1.25 MG (50000 UT) CAPS capsule Take 1 capsule (50,000 Units total) by mouth every 7 (seven) days. 4 capsule 4   No facility-administered medications prior to visit.    Allergies  Allergen Reactions  . Atorvastatin Other (See Comments)    Caused leg pain  . Penicillins Swelling and Rash    Has patient had a PCN reaction causing immediate rash, facial/tongue/throat swelling, SOB or lightheadedness with  hypotension: Yes Has patient had a PCN reaction causing severe rash involving mucus membranes or skin necrosis: No Has patient had a PCN reaction that required hospitalization: Yes Has patient had a PCN reaction occurring within the last 10 years: No If all of the above answers are "NO", then may proceed with Cephalosporin use.     Review of Systems  Constitutional: Negative for fever  and malaise/fatigue.  HENT: Negative for congestion.   Eyes: Negative for blurred vision.  Respiratory: Negative for shortness of breath.   Cardiovascular: Negative for chest pain, palpitations and leg swelling.  Gastrointestinal: Negative for abdominal pain, blood in stool and nausea.  Genitourinary: Negative for dysuria and frequency.  Musculoskeletal: Negative for falls.  Skin: Negative for rash.  Neurological: Negative for dizziness, loss of consciousness and headaches.  Endo/Heme/Allergies: Negative for environmental allergies.  Psychiatric/Behavioral: Negative for depression. The patient is not nervous/anxious.        Objective:    Physical Exam Constitutional:      Appearance: Normal appearance. He is not ill-appearing.  HENT:     Head: Normocephalic and atraumatic.     Right Ear: External ear normal.     Left Ear: External ear normal.     Nose: Nose normal.  Eyes:     General:        Right eye: No discharge.        Left eye: No discharge.  Pulmonary:     Effort: Pulmonary effort is normal.  Neurological:     Mental Status: He is alert and oriented to person, place, and time.  Psychiatric:        Behavior: Behavior normal.     There were no vitals taken for this visit. Wt Readings from Last 3 Encounters:  09/06/19 189 lb (85.7 kg)  08/10/19 190 lb (86.2 kg)  08/25/18 189 lb (85.7 kg)    Diabetic Foot Exam - Simple   No data filed     Lab Results  Component Value Date   WBC 7.4 08/19/2019   HGB 13.7 08/19/2019   HCT 40.2 08/19/2019   PLT 196.0 08/19/2019   GLUCOSE  106 (H) 08/19/2019   CHOL 106 08/19/2019   TRIG 308.0 (H) 08/19/2019   HDL 32.90 (L) 08/19/2019   LDLDIRECT 47.0 08/19/2019   LDLCALC 43 08/25/2018   ALT 31 08/19/2019   AST 22 08/19/2019   NA 139 08/19/2019   K 3.9 08/19/2019   CL 102 08/19/2019   CREATININE 0.90 08/19/2019   BUN 14 08/19/2019   CO2 30 08/19/2019   TSH 3.08 08/19/2019   PSA 0.31 08/19/2019   INR 0.97 07/02/2017   HGBA1C 6.1 08/19/2019   MICROALBUR 0.7 08/25/2018    Lab Results  Component Value Date   TSH 3.08 08/19/2019   Lab Results  Component Value Date   WBC 7.4 08/19/2019   HGB 13.7 08/19/2019   HCT 40.2 08/19/2019   MCV 87.0 08/19/2019   PLT 196.0 08/19/2019   Lab Results  Component Value Date   NA 139 08/19/2019   K 3.9 08/19/2019   CO2 30 08/19/2019   GLUCOSE 106 (H) 08/19/2019   BUN 14 08/19/2019   CREATININE 0.90 08/19/2019   BILITOT 0.3 08/19/2019   ALKPHOS 67 08/19/2019   AST 22 08/19/2019   ALT 31 08/19/2019   PROT 6.6 08/19/2019   ALBUMIN 4.3 08/19/2019   CALCIUM 9.2 08/19/2019   ANIONGAP 7 07/03/2017   GFR 89.21 08/19/2019   Lab Results  Component Value Date   CHOL 106 08/19/2019   Lab Results  Component Value Date   HDL 32.90 (L) 08/19/2019   Lab Results  Component Value Date   LDLCALC 43 08/25/2018   Lab Results  Component Value Date   TRIG 308.0 (H) 08/19/2019   Lab Results  Component Value Date   CHOLHDL 3 08/19/2019   Lab Results  Component Value  Date   HGBA1C 6.1 08/19/2019       Assessment & Plan:   Problem List Items Addressed This Visit    Neck pain on left side    Doing much better with his pain but does still struggle. Well controlled with current meds and activity. No changes      Hyperlipidemia - Primary    Encouraged heart healthy diet, increase exercise, avoid trans fats, consider a krill oil cap daily. Tolerating Rosuvastatin.      Relevant Orders   Lipid panel   Anxiety as acute reaction to exceptional stress    He is doing  better and tolerates the Alprazolam as needed.       Hypogonadism in male    Supplement and monitor      Relevant Orders   Testosterone   Essential hypertension    Monitor and report any concerns, no changes to meds. Encouraged heart healthy diet such as the DASH diet and exercise as tolerated.       Relevant Orders   CBC   Comprehensive metabolic panel   TSH   Hyperglycemia    hgba1c acceptable, minimize simple carbs. Increase exercise as tolerated.      Relevant Orders   Hemoglobin A1c      I am having Elijah Marquez maintain his aspirin, clopidogrel, Vitamin D (Ergocalciferol), nitroGLYCERIN, Testosterone, rosuvastatin, HYDROcodone-acetaminophen, ALPRAZolam, and metoprolol tartrate.  No orders of the defined types were placed in this encounter.    I discussed the assessment and treatment plan with the patient. The patient was provided an opportunity to ask questions and all were answered. The patient agreed with the plan and demonstrated an understanding of the instructions.   The patient was advised to call back or seek an in-person evaluation if the symptoms worsen or if the condition fails to improve as anticipated.  I provided 25 minutes of non-face-to-face time during this encounter.   Penni Homans, MD

## 2020-02-27 NOTE — Assessment & Plan Note (Signed)
Doing much better with his pain but does still struggle. Well controlled with current meds and activity. No changes

## 2020-02-27 NOTE — Assessment & Plan Note (Signed)
Monitor and report any concerns, no changes to meds. Encouraged heart healthy diet such as the DASH diet and exercise as tolerated.  ?

## 2020-02-27 NOTE — Assessment & Plan Note (Signed)
hgba1c acceptable, minimize simple carbs. Increase exercise as tolerated.  

## 2020-03-04 ENCOUNTER — Other Ambulatory Visit: Payer: Self-pay | Admitting: Family Medicine

## 2020-03-08 ENCOUNTER — Other Ambulatory Visit: Payer: Self-pay | Admitting: Family Medicine

## 2020-03-08 DIAGNOSIS — I25119 Atherosclerotic heart disease of native coronary artery with unspecified angina pectoris: Secondary | ICD-10-CM

## 2020-03-08 MED ORDER — HYDROCODONE-ACETAMINOPHEN 10-325 MG PO TABS: 1.0000 | ORAL_TABLET | Freq: Four times a day (QID) | ORAL | 0 refills | Status: DC | PRN

## 2020-04-03 ENCOUNTER — Telehealth: Payer: Self-pay

## 2020-04-03 NOTE — Telephone Encounter (Signed)
Patient called in to  See if Dr. Charlett Blake could send in an prescription  For metoprolol tartrate (LOPRESSOR) 25 MG tablet [id number]    Please send it to  [id number] - [place], [address], [place] [address]  Phone:  [phone] Fax:  [phone]  DEA #:  [id number]

## 2020-04-03 NOTE — Telephone Encounter (Signed)
Left detailed message on machine that rx was sent to pharmacy on 02/15/20 and should be on file.  He needs to call pharmacy.

## 2020-04-06 ENCOUNTER — Other Ambulatory Visit: Payer: Self-pay | Admitting: Family Medicine

## 2020-04-06 MED ORDER — HYDROCODONE-ACETAMINOPHEN 10-325 MG PO TABS: 1.0000 | ORAL_TABLET | Freq: Four times a day (QID) | ORAL | 0 refills | Status: DC | PRN

## 2020-04-21 ENCOUNTER — Other Ambulatory Visit: Payer: Self-pay | Admitting: Family Medicine

## 2020-04-21 NOTE — Telephone Encounter (Signed)
Requesting:Norco Contract:01/2018 UDS:08/19/2019 Last Visit:02/24/2020 Next Visit:none Last Refill:04/06/2020  Please Advise

## 2020-04-21 NOTE — Telephone Encounter (Signed)
Can you refuse medication-not allowing me.

## 2020-04-21 NOTE — Telephone Encounter (Signed)
Medication: HYDROcodone-acetaminophen (NORCO) 10-325 MG tablet [id number]    Has the patient contacted their pharmacy? No. (If no, request that the patient contact the pharmacy for the refill.) (If yes, when and what did the pharmacy advise?)  Preferred Pharmacy (with phone number or street name): [id number] [place] [place] - [address]  [place], [place] [address]  Phone:  [phone] Fax:  [phone]  DEA #:  [id number]  Agent: Please be advised that RX refills may take up to 3 business days. We ask that you follow-up with your pharmacy.

## 2020-04-21 NOTE — Telephone Encounter (Signed)
He is asking too early he is not due until 05/07/2020 it looks like

## 2020-04-27 ENCOUNTER — Other Ambulatory Visit: Payer: Self-pay | Admitting: Family Medicine

## 2020-04-27 MED ORDER — TESTOSTERONE 12.5 MG/ACT (1%) TD GEL: 100.0000 mg | Freq: Every day | TRANSDERMAL | 0 refills | Status: DC

## 2020-05-08 ENCOUNTER — Other Ambulatory Visit: Payer: Self-pay | Admitting: Family Medicine

## 2020-05-08 NOTE — Telephone Encounter (Signed)
Last written: 04/06/2020 Last ov: 02/24/2020 Next ov: none Contract: 01/23/18 UDS: 01/23/18

## 2020-05-09 ENCOUNTER — Other Ambulatory Visit: Payer: Self-pay | Admitting: Family Medicine

## 2020-05-09 NOTE — Telephone Encounter (Signed)
He needs to get in for lab work which is already ordered and then he can have refills

## 2020-05-10 ENCOUNTER — Other Ambulatory Visit: Payer: Self-pay | Admitting: Family Medicine

## 2020-05-10 ENCOUNTER — Other Ambulatory Visit: Payer: Self-pay | Admitting: *Deleted

## 2020-05-10 DIAGNOSIS — Z79899 Other long term (current) drug therapy: Secondary | ICD-10-CM

## 2020-05-10 DIAGNOSIS — R7989 Other specified abnormal findings of blood chemistry: Secondary | ICD-10-CM | POA: Diagnosis not present

## 2020-05-10 DIAGNOSIS — G8929 Other chronic pain: Secondary | ICD-10-CM

## 2020-05-10 DIAGNOSIS — E291 Testicular hypofunction: Secondary | ICD-10-CM | POA: Diagnosis not present

## 2020-05-10 MED ORDER — HYDROCODONE-ACETAMINOPHEN 10-325 MG PO TABS: 1.0000 | ORAL_TABLET | Freq: Four times a day (QID) | ORAL | 0 refills | Status: DC | PRN

## 2020-05-10 NOTE — Telephone Encounter (Signed)
I have the patient coming in today for testosterone (he is going to 3rd floor no lab appts here) and I have him scheduled for lab appt here on 6/17 for uds and to sign contract for hydrocodone.  He had an appointment to come in in July for follow up.  He needs his hydrocodone refilled.   Last written: 04/06/2020 Last ov: 02/24/2020 Next ov: none Contract: 01/23/18 UDS: 01/23/18

## 2020-05-10 NOTE — Addendum Note (Signed)
Addended by: Kem Boroughs D on: 05/10/2020 09:30 AM   Modules accepted: Orders

## 2020-05-11 ENCOUNTER — Other Ambulatory Visit: Payer: Self-pay | Admitting: Family Medicine

## 2020-05-11 LAB — TESTOSTERONE: Testosterone: 485 ng/dL (ref 264–916)

## 2020-05-11 NOTE — Telephone Encounter (Signed)
Results are back

## 2020-05-18 ENCOUNTER — Other Ambulatory Visit: Payer: BC Managed Care – PPO

## 2020-06-07 ENCOUNTER — Other Ambulatory Visit: Payer: Self-pay | Admitting: Family Medicine

## 2020-06-07 MED ORDER — TESTOSTERONE 12.5 MG/ACT (1%) TD GEL: 100.0000 mg | Freq: Every day | TRANSDERMAL | 0 refills | Status: DC

## 2020-06-07 MED ORDER — HYDROCODONE-ACETAMINOPHEN 10-325 MG PO TABS: 1.0000 | ORAL_TABLET | Freq: Four times a day (QID) | ORAL | 0 refills | Status: DC | PRN

## 2020-06-16 ENCOUNTER — Telehealth: Payer: Self-pay | Admitting: *Deleted

## 2020-06-16 NOTE — Telephone Encounter (Signed)
Prior auth sent for testosterone thru cover my meds Key: [id number]  [facility] received a request for coverage or an exception to the coverage requirements of Testosterone 12.5 MG/ACT(1%) TD GEL for you. As long as you remain covered by your prescription drug plan and there are no changes to your plan benefits, this request is approved for the following time period: 06/16/2020 - 06/16/2021

## 2020-06-26 ENCOUNTER — Other Ambulatory Visit: Payer: Self-pay | Admitting: Family Medicine

## 2020-06-26 ENCOUNTER — Ambulatory Visit (INDEPENDENT_AMBULATORY_CARE_PROVIDER_SITE_OTHER): Payer: BC Managed Care – PPO | Admitting: Family Medicine

## 2020-06-26 ENCOUNTER — Other Ambulatory Visit: Payer: Self-pay

## 2020-06-26 VITALS — BP 110/60 | HR 58 | Temp 98.5°F | Resp 12 | Ht 69.0 in | Wt 177.4 lb

## 2020-06-26 DIAGNOSIS — E782 Mixed hyperlipidemia: Secondary | ICD-10-CM | POA: Diagnosis not present

## 2020-06-26 DIAGNOSIS — E291 Testicular hypofunction: Secondary | ICD-10-CM

## 2020-06-26 DIAGNOSIS — Z79899 Other long term (current) drug therapy: Secondary | ICD-10-CM | POA: Diagnosis not present

## 2020-06-26 DIAGNOSIS — M546 Pain in thoracic spine: Secondary | ICD-10-CM | POA: Diagnosis not present

## 2020-06-26 DIAGNOSIS — I10 Essential (primary) hypertension: Secondary | ICD-10-CM

## 2020-06-26 DIAGNOSIS — R739 Hyperglycemia, unspecified: Secondary | ICD-10-CM | POA: Diagnosis not present

## 2020-06-26 DIAGNOSIS — G8929 Other chronic pain: Secondary | ICD-10-CM

## 2020-06-26 MED ORDER — TESTOSTERONE 25 MG/2.5GM (1%) TD GEL: 100.0000 mg | Freq: Every day | TRANSDERMAL | 3 refills | Status: DC

## 2020-06-26 NOTE — Assessment & Plan Note (Signed)
hgba1c acceptable, minimize simple carbs. Increase exercise as tolerated.  

## 2020-06-26 NOTE — Assessment & Plan Note (Signed)
Stable on current dose dose of Testosterone but his insurance is giving him trouble and will not pay for it.

## 2020-06-26 NOTE — Progress Notes (Signed)
Subjective:    Patient ID: Elijah Marquez, male    DOB: Apr 18, 1971, 49 y.o.   MRN: [id number]  Chief Complaint  Patient presents with  . Follow-up    HPI Patient is in today for follow up on chronic medical concerns. He feels well today. No recent febrile illness or hospitalizations. 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 No acute concerns. Denies CP/palp/SOB/HA/congestion/fevers/GI or GU c/o. Taking meds as prescribed  Past Medical History:  Diagnosis Date  . Anxiety 08/23/2012  . Anxiety as acute reaction to exceptional stress 09/20/2012  . Chicken pox as a child  . Chronic lower back pain   . Fatigue   . History of tobacco abuse    Failed Chantix and wellbutrin   . Hyperlipidemia 08/23/2012  . Hypogonadism in male 10/02/2014  . Intermittent claudication (HCC) 08/13/2015   B/l legs  . Neck pain on left side 08/23/2012  . Osteoarthritis    "all over" (07/02/2017)  . Osteoarthritis of both knees   . Tobacco abuse     Past Surgical History:  Procedure Laterality Date  . ANTERIOR CERVICAL DECOMP/DISCECTOMY FUSION  12/08/2012   C4,5,,6  . BACK SURGERY    . CORONARY ANGIOPLASTY WITH STENT PLACEMENT  07/02/2017   "1 stent"  . CORONARY STENT INTERVENTION N/A 07/02/2017   Procedure: CORONARY STENT INTERVENTION;  Surgeon: Troy Sine, MD;  Location: [facility];  Service: Cardiovascular;  Laterality: N/A;  LAD/DIAG  . KNEE ARTHROSCOPY Bilateral 2011-2012   left-right  . LEFT HEART CATH AND CORONARY ANGIOGRAPHY N/A 07/02/2017   Procedure: Left Heart Cath and Coronary Angiography;  Surgeon: Troy Sine, MD;  Location: [facility];  Service: Cardiovascular;  Laterality: N/A;  . MULTIPLE TOOTH EXTRACTIONS    . WISDOM TOOTH EXTRACTION      Family History  Problem Relation Age of Onset  . Fibromyalgia Mother   . Coronary artery disease Mother   . Hypertension Mother   . Heart attack Mother   . Heart  disease Mother        CAD  . Peripheral vascular disease Mother 71       hemorrhage from vascular procedure  . Fibromyalgia Sister   . Hypertension Sister   . Heart disease Sister   . Other Son        mildly mentally handicap  . Seizures Son   . Lupus Sister   . Emphysema Sister        smoker  . Kidney disease Daughter   . Alcohol abuse Father     Social History   Socioeconomic History  . Marital status: Married    Spouse name: Not on file  . Number of children: Not on file  . Years of education: Not on file  . Highest education level: Not on file  Occupational History  . Not on file  Tobacco Use  . Smoking status: Former Smoker    Packs/day: 1.50    Years: 35.00    Pack years: 52.50    Types: Cigarettes    Quit date: 07/31/2015    Years since quitting: 4.9  . Smokeless tobacco: Never Used  Vaping Use  . Vaping Use: Some days  . Last attempt to quit: 06/16/2017  Substance and Sexual Activity  . Alcohol use: No  . Drug use: No  . Sexual activity: Not Currently    Partners: Female  Other Topics Concern  .  Not on file  Social History Narrative  . Not on file   Social Determinants of Health   Financial Resource Strain:   . Difficulty of Paying Living Expenses:   Food Insecurity:   . Worried About Charity fundraiser in the Last Year:   . Arboriculturist in the Last Year:   Transportation Needs:   . Film/video editor (Medical):   Marland Kitchen Lack of Transportation (Non-Medical):   Physical Activity:   . Days of Exercise per Week:   . Minutes of Exercise per Session:   Stress:   . Feeling of Stress :   Social Connections:   . Frequency of Communication with Friends and Family:   . Frequency of Social Gatherings with Friends and Family:   . Attends Religious Services:   . Active Member of Clubs or Organizations:   . Attends Archivist Meetings:   Marland Kitchen Marital Status:   Intimate Partner Violence:   . Fear of Current or Ex-Partner:   . Emotionally  Abused:   Marland Kitchen Physically Abused:   . Sexually Abused:     Outpatient Medications Prior to Visit  Medication Sig Dispense Refill  . ALPRAZolam (XANAX) 1 MG tablet Take 1 tablet (1 mg total) by mouth 2 (two) times daily as needed for sleep or anxiety. 60 tablet 2  . aspirin 81 MG tablet Take 1 tablet (81 mg total) by mouth daily. 30 tablet   . clopidogrel (PLAVIX) 75 MG tablet Take 1 tablet (75 mg total) by mouth daily. 90 tablet 1  . HYDROcodone-acetaminophen (NORCO) 10-325 MG tablet Take 1 tablet by mouth every 6 (six) hours as needed. 120 tablet 0  . metoprolol tartrate (LOPRESSOR) 25 MG tablet TAKE 1 TABLET BY MOUTH TWICE A DAY 180 tablet 1  . nitroGLYCERIN (NITROSTAT) 0.4 MG SL tablet Place 1 tablet (0.4 mg total) under the tongue every 5 (five) minutes as needed for chest pain. To ER if no resolution 25 tablet 3  . rosuvastatin (CRESTOR) 40 MG tablet TAKE 1 TABLET BY MOUTH EVERY DAY 90 tablet 1  . Testosterone 12.5 MG/ACT (1%) GEL Apply 100 mg topically daily. 300 g 0  . Vitamin D, Ergocalciferol, (DRISDOL) 1.25 MG (50000 UT) CAPS capsule Take 1 capsule (50,000 Units total) by mouth every 7 (seven) days. 4 capsule 4   No facility-administered medications prior to visit.    Allergies  Allergen Reactions  . Atorvastatin Other (See Comments)    Caused leg pain  . Penicillins Swelling and Rash    Has patient had a PCN reaction causing immediate rash, facial/tongue/throat swelling, SOB or lightheadedness with hypotension: Yes Has patient had a PCN reaction causing severe rash involving mucus membranes or skin necrosis: No Has patient had a PCN reaction that required hospitalization: Yes Has patient had a PCN reaction occurring within the last 10 years: No If all of the above answers are "NO", then may proceed with Cephalosporin use.     Review of Systems  Constitutional: Negative for fever and malaise/fatigue.  HENT: Negative for congestion.   Eyes: Negative for blurred vision.    Respiratory: Negative for shortness of breath.   Cardiovascular: Negative for chest pain, palpitations and leg swelling.  Gastrointestinal: Negative for abdominal pain, blood in stool and nausea.  Genitourinary: Negative for dysuria and frequency.  Musculoskeletal: Positive for back pain, joint pain and myalgias. Negative for falls.  Skin: Negative for rash.  Neurological: Negative for dizziness, loss of consciousness and headaches.  Endo/Heme/Allergies: Negative for environmental allergies.  Psychiatric/Behavioral: Negative for depression. The patient is not nervous/anxious.        Objective:    Physical Exam Vitals and nursing note reviewed.  Constitutional:      General: He is not in acute distress.    Appearance: He is well-developed.  HENT:     Head: Normocephalic and atraumatic.     Nose: Nose normal.  Eyes:     General:        Right eye: No discharge.        Left eye: No discharge.  Cardiovascular:     Rate and Rhythm: Normal rate and regular rhythm.     Heart sounds: No murmur heard.   Pulmonary:     Effort: Pulmonary effort is normal.     Breath sounds: Normal breath sounds.  Abdominal:     General: Bowel sounds are normal.     Palpations: Abdomen is soft.     Tenderness: There is no abdominal tenderness.  Musculoskeletal:     Cervical back: Normal range of motion and neck supple.  Skin:    General: Skin is warm and dry.  Neurological:     Mental Status: He is alert and oriented to person, place, and time.     BP (!) 110/60 (BP Location: Left Arm, Cuff Size: Normal)   Pulse 58   Temp 98.5 F (36.9 C) (Oral)   Resp 12   Ht 5\' 9"  (1.753 m)   Wt 177 lb 6.4 oz (80.5 kg)   SpO2 97%   BMI 26.20 kg/m  Wt Readings from Last 3 Encounters:  06/26/20 177 lb 6.4 oz (80.5 kg)  09/06/19 189 lb (85.7 kg)  08/10/19 190 lb (86.2 kg)    Diabetic Foot Exam - Simple   No data filed     Lab Results  Component Value Date   WBC 7.4 08/19/2019   HGB 13.7  08/19/2019   HCT 40.2 08/19/2019   PLT 196.0 08/19/2019   GLUCOSE 106 (H) 08/19/2019   CHOL 106 08/19/2019   TRIG 308.0 (H) 08/19/2019   HDL 32.90 (L) 08/19/2019   LDLDIRECT 47.0 08/19/2019   LDLCALC 43 08/25/2018   ALT 31 08/19/2019   AST 22 08/19/2019   NA 139 08/19/2019   K 3.9 08/19/2019   CL 102 08/19/2019   CREATININE 0.90 08/19/2019   BUN 14 08/19/2019   CO2 30 08/19/2019   TSH 3.08 08/19/2019   PSA 0.31 08/19/2019   INR 0.97 07/02/2017   HGBA1C 6.1 08/19/2019   MICROALBUR 0.7 08/25/2018    Lab Results  Component Value Date   TSH 3.08 08/19/2019   Lab Results  Component Value Date   WBC 7.4 08/19/2019   HGB 13.7 08/19/2019   HCT 40.2 08/19/2019   MCV 87.0 08/19/2019   PLT 196.0 08/19/2019   Lab Results  Component Value Date   NA 139 08/19/2019   K 3.9 08/19/2019   CO2 30 08/19/2019   GLUCOSE 106 (H) 08/19/2019   BUN 14 08/19/2019   CREATININE 0.90 08/19/2019   BILITOT 0.3 08/19/2019   ALKPHOS 67 08/19/2019   AST 22 08/19/2019   ALT 31 08/19/2019   PROT 6.6 08/19/2019   ALBUMIN 4.3 08/19/2019   CALCIUM 9.2 08/19/2019   ANIONGAP 7 07/03/2017   GFR 89.21 08/19/2019   Lab Results  Component Value Date   CHOL 106 08/19/2019   Lab Results  Component Value Date   HDL 32.90 (L) 08/19/2019  Lab Results  Component Value Date   LDLCALC 43 08/25/2018   Lab Results  Component Value Date   TRIG 308.0 (H) 08/19/2019   Lab Results  Component Value Date   CHOLHDL 3 08/19/2019   Lab Results  Component Value Date   HGBA1C 6.1 08/19/2019       Assessment & Plan:   Problem List Items Addressed This Visit    Hyperlipidemia    Encouraged heart healthy diet, increase exercise, avoid trans fats, consider a krill oil cap daily, tolerating Rosuvastatin      Back pain    He continues to struggle with pain but manages to work full time and he has bought a Chiropractor. No change in therapy      Hypogonadism in male    Stable on current dose dose of  Testosterone but his insurance is giving him trouble and will not pay for it.       Essential hypertension    Well controlled, no changes to meds. Encouraged heart healthy diet such as the DASH diet and exercise as tolerated.       Hyperglycemia    hgba1c acceptable, minimize simple carbs. Increase exercise as tolerated.        Other Visit Diagnoses    High risk medication use    -  Primary      I have discontinued Vinnie Netherland's Vitamin D (Ergocalciferol). I am also having him start on Testosterone. Additionally, I am having him maintain his aspirin, nitroGLYCERIN, ALPRAZolam, rosuvastatin, clopidogrel, Testosterone, HYDROcodone-acetaminophen, and metoprolol tartrate.  Meds ordered this encounter  Medications  . Testosterone 25 MG/2.5GM (1%) GEL    Sig: Place 100 mg onto the skin daily.    Dispense:  3000 g    Refill:  3     Penni Homans, MD

## 2020-06-26 NOTE — Assessment & Plan Note (Signed)
Well controlled, no changes to meds. Encouraged heart healthy diet such as the DASH diet and exercise as tolerated.  °

## 2020-06-26 NOTE — Patient Instructions (Addendum)
GoodRx or CoverMyMeds  Call [employer], [place], [facility] pharmacy  Testosterone skin gel What is this medicine? TESTOSTERONE (tes TOS ter one) is the main male hormone. It supports normal male traits such as muscle growth, facial hair, and deep voice. This gel is used in males to treat low testosterone levels. This medicine may be used for other purposes; ask your health care provider or pharmacist if you have questions. COMMON BRAND NAME(S): AndroGel, FORTESTA, Testim, Vogelxo What should I tell my health care provider before I take this medicine? They need to know if you have any of these conditions:  breast cancer  diabetes  heart disease  if a male partner is pregnant or trying to get pregnant  kidney disease  liver disease  lung disease  prostate cancer, enlargement  an unusual or allergic reaction to testosterone, soy proteins, other medicines, foods, dyes, or preservatives  pregnant or trying to get pregnant  breast-feeding How should I use this medicine? This medicine is for external use only. This medicine is applied at the same time every day (preferably in the morning) to clean, dry, intact skin. If you take a bath or shower in the morning, apply the gel after the bath or shower. Follow the directions on the prescription label. Make sure that you are using your testosterone gel product correctly and applying it only to the appropriate skin area (see below). Allow the skin to dry a few minutes then cover with clothing to prevent others from coming in contact with the medicine on your skin. The gel is flammable. Avoid fire, flame, or smoking until the gel has dried. Wash your hands with soap and water after use. For AndroGel 1% Packets: Open the packet(s) needed for your dose. You can put the entire dose into your palm all at once or just a little at a time to apply. If you prefer, you can instead squeeze the gel directly onto the area you are applying it to. Apply on the  shoulders, upper arm, or abdomen as directed. Do not apply to the scrotum or genitals. Be sure you use the correct total dose. It is best to wait 5 to 6 hours after application of the gel before showering or swimming. For AndroGel 1%: Pump the dose into the palm of your hand. You can put the entire dose into your palm all at once or just a little at a time to apply. If you prefer, you can instead pump the gel directly onto the area you are applying it to. Apply on the shoulders, upper arm, or abdomen as directed. Do not apply to the scrotum or genitals. Be sure you use the correct total dose. It is best to wait for 5 to 6 hours after application of the gel before showering or swimming. For Androgel 1.62% packets: Open the packet(s) needed for your dose. You can put the entire dose into your palm all at once or just a little at a time to apply. If you prefer, you can instead squeeze the gel directly onto the area you are applying it to. Apply on the shoulders and upper arms as directed. Do not apply to other parts of the body including the abdomen, genitals, chest, armpits, or knees. Be sure you use the correct total dose. It is best to wait 2 hours after application of the gel before washing, showering, or swimming. For AndroGel 1.62%: Pump the dose into the palm of your hand. Dispense one pump of gel at a time into  the palm of your hand before applying it. If you prefer, you can instead pump the gel directly onto the area you are applying it to. Apply on the shoulders and upper arms as directed. Do not apply to other parts of the body including the abdomen, genitals, chest, armpits, or knees. Be sure you use the correct total dose. It is best to wait 2 hours after application of the gel before washing, showering, or swimming. For Testim: Open the tube(s) needed for your dose. Squeeze the gel from the tube into the palm of your hand. Apply on the shoulders or upper arms as directed. Do not apply to the scrotum,  genitals, or abdomen. Be sure you use the correct total dose. Do not shower or swim for at least 2 hours after application of the gel. For Fortesta: Use the multi-dose pump to pump the gel directly onto the area you are applying it to. Apply on the thighs as directed. Do not apply to the abdomen, penis, scrotum, shoulders or upper arms. Gently rub the gel onto the skin using your finger. Be sure you use the correct total dose. Do not shower or swim for at least 2 hours after application of the gel. A special MedGuide will be given to you by the pharmacist with each prescription and refill. Be sure to read this information carefully each time. Talk to your pediatrician regarding the use of this medicine in children. Special care may be needed. Overdosage: If you think you have taken too much of this medicine contact a poison control center or emergency room at once. NOTE: This medicine is only for you. Do not share this medicine with others. What if I miss a dose? If you miss a dose, use it as soon as you can. If it is almost time for your next dose, use only that dose. Do not use double or extra doses. What may interact with this medicine?  medicines for diabetes  medicines that treat or prevent blood clots like warfarin  oxyphenbutazone  propranolol  steroid medicines like prednisone or cortisone This list may not describe all possible interactions. Give your health care provider a list of all the medicines, herbs, non-prescription drugs, or dietary supplements you use. Also tell them if you smoke, drink alcohol, or use illegal drugs. Some items may interact with your medicine. What should I watch for while using this medicine? Visit your doctor or health care professional for regular checks on your progress. They will need to check the level of testosterone in your blood. This medicine is only approved for use in men who have low levels of testosterone related to certain medical conditions.  Heart attacks and strokes have been reported with the use of this medicine. Notify your doctor or health care professional and seek emergency treatment if you develop breathing problems; changes in vision; confusion; chest pain or chest tightness; sudden arm pain; severe, sudden headache; trouble speaking or understanding; sudden numbness or weakness of the face, arm or leg; loss of balance or coordination. Talk to your doctor about the risks and benefits of this medicine. This medicine can transfer from your body to others. If a person or pet comes in contact with the area where this medicine was applied to your skin, they may have a serious risk of side effects. If you cannot avoid skin-to-skin contact with another person, make sure the site where this medicine was applied is covered with clothing. If accidental contact happens, the skin of the  person or pet should be washed right away with soap and water. Also, a male partner who is pregnant or trying to get pregnant should avoid contact with the gel or treated skin. This medicine may affect blood sugar levels. If you have diabetes, check with your doctor or health care professional before you change your diet or the dose of your diabetic medicine. This drug is banned from use in athletes by most athletic organizations. What side effects may I notice from receiving this medicine? Side effects that you should report to your doctor or health care professional as soon as possible:  allergic reactions like skin rash, itching or hives, swelling of the face, lips, or tongue  breast enlargement  breathing problems  changes in mood, especially anger, depression, or rage  dark urine  general ill feeling or flu-like symptoms  light-colored stools  loss of appetite, nausea  nausea, vomiting  right upper belly pain  stomach pain  swelling of ankles  too frequent or persistent erections  trouble passing urine or change in the amount of  urine  unusually weak or tired  yellowing of the eyes or skin Side effects that usually do not require medical attention (report to your doctor or health care professional if they continue or are bothersome):  acne  change in sex drive or performance  hair loss  headache This list may not describe all possible side effects. Call your doctor for medical advice about side effects. You may report side effects to FDA at 1-800-FDA-1088. Where should I keep my medicine? Keep out of the reach of children. This medicine can be abused. Keep your medicine in a safe place to protect it from theft. Do not share this medicine with anyone. Selling or giving away this medicine is dangerous and against the law. Store at room temperature between 15 to 30 degrees C (59 to 86 degrees F). Keep closed until use. Protect from heat and light. This medicine is flammable. Avoid exposure to heat, fire, flame, and smoking. Throw away any unused medicine after the expiration date. NOTE: This sheet is a summary. It may not cover all possible information. If you have questions about this medicine, talk to your doctor, pharmacist, or health care provider.  2020 Elsevier/Gold Standard (2014-02-03 08:27:26)

## 2020-06-26 NOTE — Assessment & Plan Note (Signed)
He continues to struggle with pain but manages to work full time and he has bought a Chiropractor. No change in therapy

## 2020-06-26 NOTE — Assessment & Plan Note (Signed)
Encouraged heart healthy diet, increase exercise, avoid trans fats, consider a krill oil cap daily, tolerating Rosuvastatin 

## 2020-07-14 ENCOUNTER — Other Ambulatory Visit: Payer: Self-pay | Admitting: Family Medicine

## 2020-07-14 MED ORDER — HYDROCODONE-ACETAMINOPHEN 10-325 MG PO TABS: 1.0000 | ORAL_TABLET | Freq: Four times a day (QID) | ORAL | 0 refills | Status: DC | PRN

## 2020-07-14 NOTE — Telephone Encounter (Signed)
Requesting: [facility] Contract: n/a UDS:08/19/19 Last Visit:06/26/20 Next Visit:09/26/20 Last Refill:02/07/20  Please Advise

## 2020-08-14 ENCOUNTER — Other Ambulatory Visit: Payer: Self-pay | Admitting: Family Medicine

## 2020-08-15 MED ORDER — HYDROCODONE-ACETAMINOPHEN 10-325 MG PO TABS: 1.0000 | ORAL_TABLET | Freq: Four times a day (QID) | ORAL | 0 refills | Status: DC | PRN

## 2020-08-15 NOTE — Telephone Encounter (Signed)
Requesting: [place]: N/A UDS:08/19/2019 Last Visit: 06/26/20 Next Visit:09/26/20 Last Refill:07/14/20  Please Advise

## 2020-08-22 ENCOUNTER — Telehealth: Payer: Self-pay | Admitting: Family Medicine

## 2020-08-22 NOTE — Telephone Encounter (Signed)
Patient states having a hard time getting medication, patient states insurance has changed and not wanting to pay for medication, patient would like [place] to send prescription to [facility].    Medication: Testosterone 25 MG/2.5GM (1%) GEL [[id number]]    Has the patient contacted their pharmacy? No. (If no, request that the patient contact the pharmacy for the refill.) (If yes, when and what did the pharmacy advise?)  Preferred Pharmacy (with phone number or street name):  [facility] [address] [address]  [phone]  Agent: Please be advised that RX refills may take up to 3 business days. We ask that you follow-up with your pharmacy.

## 2020-08-23 ENCOUNTER — Other Ambulatory Visit: Payer: Self-pay | Admitting: Family Medicine

## 2020-08-23 MED ORDER — TESTOSTERONE 25 MG/2.5GM (1%) TD GEL: 100.0000 mg | Freq: Every day | TRANSDERMAL | 0 refills | Status: DC

## 2020-08-23 NOTE — Telephone Encounter (Signed)
done

## 2020-08-23 NOTE — Telephone Encounter (Signed)
Patient requesting this medication for refill please.

## 2020-08-25 NOTE — Telephone Encounter (Signed)
[facility] prescription sent to pharmacy.

## 2020-09-05 ENCOUNTER — Other Ambulatory Visit: Payer: Self-pay | Admitting: Family Medicine

## 2020-09-05 ENCOUNTER — Other Ambulatory Visit: Payer: Self-pay

## 2020-09-05 DIAGNOSIS — M546 Pain in thoracic spine: Secondary | ICD-10-CM

## 2020-09-05 DIAGNOSIS — G8929 Other chronic pain: Secondary | ICD-10-CM

## 2020-09-05 MED ORDER — HYDROCODONE-ACETAMINOPHEN 10-325 MG PO TABS: 1.0000 | ORAL_TABLET | Freq: Four times a day (QID) | ORAL | 0 refills | Status: DC | PRN

## 2020-09-05 NOTE — Telephone Encounter (Signed)
Left message on machine that rx was sent to correct pharmacy and rx cancelled at [facility].

## 2020-09-05 NOTE — Telephone Encounter (Signed)
Medication was sent to wrong pharmacy, patient would like medication sent to [facility]  [place]   [phone]  Patient insurance has changed , patient can get medication at a lower price here. Patient would also like to look into a different  Medication

## 2020-09-05 NOTE — Telephone Encounter (Signed)
Sent new prescription to [facility] but please call and cancel prescription at [facility]

## 2020-09-07 ENCOUNTER — Other Ambulatory Visit: Payer: Self-pay

## 2020-09-07 ENCOUNTER — Other Ambulatory Visit: Payer: Self-pay | Admitting: Family Medicine

## 2020-09-07 DIAGNOSIS — R7989 Other specified abnormal findings of blood chemistry: Secondary | ICD-10-CM

## 2020-09-07 MED ORDER — TESTOSTERONE 25 MG/2.5GM (1%) TD GEL: 100.0000 mg | Freq: Every day | TRANSDERMAL | 0 refills | Status: DC

## 2020-09-07 MED ORDER — TESTOSTERONE 1.62 % TD GEL: 2.0000 | Freq: Every day | TRANSDERMAL | 5 refills | Status: DC

## 2020-09-07 NOTE — Progress Notes (Signed)
I have sent to [facility], [facility] at [facility]

## 2020-09-07 NOTE — Progress Notes (Signed)
[facility] pt is now at pharmacy requesting pump instead of gel packets.

## 2020-09-07 NOTE — Progress Notes (Unsigned)
Pt still requesting refill of Testosterone *Controlled* to be sent to [facility].   [facility] needs it to got to [place].   [facility] We can not send [facility].

## 2020-09-07 NOTE — Telephone Encounter (Signed)
[facility] pharmacist called office spoke with Probation officer. Pt is now requesting pump 1.62% instead of gel.

## 2020-09-07 NOTE — Telephone Encounter (Signed)
Sent in the requested med

## 2020-09-12 ENCOUNTER — Other Ambulatory Visit: Payer: Self-pay | Admitting: Family Medicine

## 2020-09-13 MED ORDER — HYDROCODONE-ACETAMINOPHEN 10-325 MG PO TABS: 1.0000 | ORAL_TABLET | Freq: Four times a day (QID) | ORAL | 0 refills | Status: DC | PRN

## 2020-09-13 NOTE — Telephone Encounter (Signed)
Requesting: [facility] Contract:n/a UDS:08/2019 Last Visit:06/26/20 Next Visit:09/26/20 Last Refill:09/05/20  Please Advise

## 2020-09-23 ENCOUNTER — Other Ambulatory Visit: Payer: Self-pay | Admitting: Family Medicine

## 2020-09-26 ENCOUNTER — Encounter: Payer: Self-pay | Admitting: Family Medicine

## 2020-09-26 ENCOUNTER — Ambulatory Visit (INDEPENDENT_AMBULATORY_CARE_PROVIDER_SITE_OTHER): Payer: BC Managed Care – PPO | Admitting: Family Medicine

## 2020-09-26 ENCOUNTER — Other Ambulatory Visit: Payer: Self-pay

## 2020-09-26 VITALS — BP 126/76 | HR 62 | Temp 98.0°F | Resp 16 | Wt 181.6 lb

## 2020-09-26 DIAGNOSIS — E291 Testicular hypofunction: Secondary | ICD-10-CM

## 2020-09-26 DIAGNOSIS — I10 Essential (primary) hypertension: Secondary | ICD-10-CM | POA: Diagnosis not present

## 2020-09-26 DIAGNOSIS — R739 Hyperglycemia, unspecified: Secondary | ICD-10-CM | POA: Diagnosis not present

## 2020-09-26 DIAGNOSIS — E782 Mixed hyperlipidemia: Secondary | ICD-10-CM

## 2020-09-26 DIAGNOSIS — F419 Anxiety disorder, unspecified: Secondary | ICD-10-CM

## 2020-09-26 DIAGNOSIS — F32A Depression, unspecified: Secondary | ICD-10-CM

## 2020-09-26 DIAGNOSIS — Z87891 Personal history of nicotine dependence: Secondary | ICD-10-CM

## 2020-09-26 DIAGNOSIS — I25119 Atherosclerotic heart disease of native coronary artery with unspecified angina pectoris: Secondary | ICD-10-CM

## 2020-09-26 MED ORDER — METOPROLOL TARTRATE 25 MG PO TABS
25.0000 mg | ORAL_TABLET | Freq: Two times a day (BID) | ORAL | 1 refills | Status: DC
Start: 2020-09-26 — End: 2021-03-30

## 2020-09-26 MED ORDER — TESTOSTERONE 1.62 % TD GEL
2.0000 | Freq: Every day | TRANSDERMAL | 5 refills | Status: DC
Start: 2020-09-26 — End: 2021-04-18

## 2020-09-26 MED ORDER — ALPRAZOLAM 1 MG PO TABS: 1.0000 mg | ORAL_TABLET | Freq: Two times a day (BID) | ORAL | 2 refills | Status: DC | PRN

## 2020-09-26 MED ORDER — SILDENAFIL CITRATE 20 MG PO TABS
20.0000 mg | ORAL_TABLET | Freq: Every day | ORAL | 1 refills | Status: DC | PRN
Start: 2020-09-26 — End: 2020-10-20

## 2020-09-26 MED ORDER — CLOPIDOGREL BISULFATE 75 MG PO TABS: 75.0000 mg | ORAL_TABLET | Freq: Every day | ORAL | 1 refills | Status: DC

## 2020-09-26 NOTE — Patient Instructions (Addendum)
[facility]   Hypertension, Adult High blood pressure (hypertension) is when the force of blood pumping through the arteries is too strong. The arteries are the blood vessels that carry blood from the heart throughout the body. Hypertension forces the heart to work harder to pump blood and may cause arteries to become narrow or stiff. Untreated or uncontrolled hypertension can cause a heart attack, heart failure, a stroke, kidney disease, and other problems. A blood pressure reading consists of a higher number over a lower number. Ideally, your blood pressure should be below 120/80. The first ("top") number is called the systolic pressure. It is a measure of the pressure in your arteries as your heart beats. The second ("bottom") number is called the diastolic pressure. It is a measure of the pressure in your arteries as the heart relaxes. What are the causes? The exact cause of this condition is not known. There are some conditions that result in or are related to high blood pressure. What increases the risk? Some risk factors for high blood pressure are under your control. The following factors may make you more likely to develop this condition:  Smoking.  Having type 2 diabetes mellitus, high cholesterol, or both.  Not getting enough exercise or physical activity.  Being overweight.  Having too much fat, sugar, calories, or salt (sodium) in your diet.  Drinking too much alcohol. Some risk factors for high blood pressure may be difficult or impossible to change. Some of these factors include:  Having chronic kidney disease.  Having a family history of high blood pressure.  Age. Risk increases with age.  Race. You may be at higher risk if you are African American.  Gender. Men are at higher risk than women before age 54. After age 13, women are at higher risk than men.  Having obstructive sleep apnea.  Stress. What are the signs or symptoms? High blood pressure may not cause  symptoms. Very high blood pressure (hypertensive crisis) may cause:  Headache.  Anxiety.  Shortness of breath.  Nosebleed.  Nausea and vomiting.  Vision changes.  Severe chest pain.  Seizures. How is this diagnosed? This condition is diagnosed by measuring your blood pressure while you are seated, with your arm resting on a flat surface, your legs uncrossed, and your feet flat on the floor. The cuff of the blood pressure monitor will be placed directly against the skin of your upper arm at the level of your heart. It should be measured at least twice using the same arm. Certain conditions can cause a difference in blood pressure between your right and left arms. Certain factors can cause blood pressure readings to be lower or higher than normal for a short period of time:  When your blood pressure is higher when you are in a health care provider's office than when you are at home, this is called white coat hypertension. Most people with this condition do not need medicines.  When your blood pressure is higher at home than when you are in a health care provider's office, this is called masked hypertension. Most people with this condition may need medicines to control blood pressure. If you have a high blood pressure reading during one visit or you have normal blood pressure with other risk factors, you may be asked to:  Return on a different day to have your blood pressure checked again.  Monitor your blood pressure at home for 1 week or longer. If you are diagnosed with hypertension, you may  have other blood or imaging tests to help your health care provider understand your overall risk for other conditions. How is this treated? This condition is treated by making healthy lifestyle changes, such as eating healthy foods, exercising more, and reducing your alcohol intake. Your health care provider may prescribe medicine if lifestyle changes are not enough to get your blood pressure under  control, and if:  Your systolic blood pressure is above 130.  Your diastolic blood pressure is above 80. Your personal target blood pressure may vary depending on your medical conditions, your age, and other factors. Follow these instructions at home: Eating and drinking   Eat a diet that is high in fiber and potassium, and low in sodium, added sugar, and fat. An example eating plan is called the DASH (Dietary Approaches to Stop Hypertension) diet. To eat this way: ? Eat plenty of fresh fruits and vegetables. Try to fill one half of your plate at each meal with fruits and vegetables. ? Eat whole grains, such as whole-wheat pasta, brown rice, or whole-grain bread. Fill about one fourth of your plate with whole grains. ? Eat or drink low-fat dairy products, such as skim milk or low-fat yogurt. ? Avoid fatty cuts of meat, processed or cured meats, and poultry with skin. Fill about one fourth of your plate with lean proteins, such as fish, chicken without skin, beans, eggs, or tofu. ? Avoid pre-made and processed foods. These tend to be higher in sodium, added sugar, and fat.  Reduce your daily sodium intake. Most people with hypertension should eat less than 1,500 mg of sodium a day.  Do not drink alcohol if: ? Your health care provider tells you not to drink. ? You are pregnant, may be pregnant, or are planning to become pregnant.  If you drink alcohol: ? Limit how much you use to:  0-1 drink a day for women.  0-2 drinks a day for men. ? Be aware of how much alcohol is in your drink. In the [place] one drink equals one 12 oz bottle of beer (355 mL), one 5 oz glass of wine (148 mL), or one 1 oz glass of hard liquor (44 mL). Lifestyle   Work with your health care provider to maintain a healthy body weight or to lose weight. Ask what an ideal weight is for you.  Get at least 30 minutes of exercise most days of the week. Activities may include walking, swimming, or biking.  Include  exercise to strengthen your muscles (resistance exercise), such as Pilates or lifting weights, as part of your weekly exercise routine. Try to do these types of exercises for 30 minutes at least 3 days a week.  Do not use any products that contain nicotine or tobacco, such as cigarettes, e-cigarettes, and chewing tobacco. If you need help quitting, ask your health care provider.  Monitor your blood pressure at home as told by your health care provider.  Keep all follow-up visits as told by your health care provider. This is important. Medicines  Take over-the-counter and prescription medicines only as told by your health care provider. Follow directions carefully. Blood pressure medicines must be taken as prescribed.  Do not skip doses of blood pressure medicine. Doing this puts you at risk for problems and can make the medicine less effective.  Ask your health care provider about side effects or reactions to medicines that you should watch for. Contact a health care provider if you:  Think you are having a reaction  to a medicine you are taking.  Have headaches that keep coming back (recurring).  Feel dizzy.  Have swelling in your ankles.  Have trouble with your vision. Get help right away if you:  Develop a severe headache or confusion.  Have unusual weakness or numbness.  Feel faint.  Have severe pain in your chest or abdomen.  Vomit repeatedly.  Have trouble breathing. Summary  Hypertension is when the force of blood pumping through your arteries is too strong. If this condition is not controlled, it may put you at risk for serious complications.  Your personal target blood pressure may vary depending on your medical conditions, your age, and other factors. For most people, a normal blood pressure is less than 120/80.  Hypertension is treated with lifestyle changes, medicines, or a combination of both. Lifestyle changes include losing weight, eating a healthy,  low-sodium diet, exercising more, and limiting alcohol. This information is not intended to replace advice given to you by your health care provider. Make sure you discuss any questions you have with your health care provider. Document Revised: 07/29/2018 Document Reviewed: 07/29/2018 Elsevier Patient Education  2020 [employer].

## 2020-09-27 LAB — CBC
HCT: 42.9 % (ref 38.5–50.0)
Hemoglobin: 14.5 g/dL (ref 13.2–17.1)
MCH: 29.7 pg (ref 27.0–33.0)
MCHC: 33.8 g/dL (ref 32.0–36.0)
MCV: 87.7 fL (ref 80.0–100.0)
MPV: 10.8 fL (ref 7.5–12.5)
Platelets: 210 10*3/uL (ref 140–400)
RBC: 4.89 10*6/uL (ref 4.20–5.80)
RDW: 12.1 % (ref 11.0–15.0)
WBC: 8.2 10*3/uL (ref 3.8–10.8)

## 2020-09-27 LAB — COMPREHENSIVE METABOLIC PANEL
AG Ratio: 1.7 (calc) (ref 1.0–2.5)
ALT: 22 U/L (ref 9–46)
AST: 20 U/L (ref 10–35)
Albumin: 4.5 g/dL (ref 3.6–5.1)
Alkaline phosphatase (APISO): 63 U/L (ref 35–144)
BUN: 12 mg/dL (ref 7–25)
CO2: 33 mmol/L — ABNORMAL HIGH (ref 20–32)
Calcium: 9.9 mg/dL (ref 8.6–10.3)
Chloride: 102 mmol/L (ref 98–110)
Creat: 0.95 mg/dL (ref 0.70–1.33)
Globulin: 2.6 g/dL (calc) (ref 1.9–3.7)
Glucose, Bld: 77 mg/dL (ref 65–99)
Potassium: 5.1 mmol/L (ref 3.5–5.3)
Sodium: 141 mmol/L (ref 135–146)
Total Bilirubin: 0.6 mg/dL (ref 0.2–1.2)
Total Protein: 7.1 g/dL (ref 6.1–8.1)

## 2020-09-27 LAB — HEMOGLOBIN A1C
Hgb A1c MFr Bld: 6 % of total Hgb — ABNORMAL HIGH (ref <5.7)
Mean Plasma Glucose: 126 (calc)
eAG (mmol/L): 7 (calc)

## 2020-09-27 LAB — LIPID PANEL
Cholesterol: 110 mg/dL (ref <200)
HDL: 41 mg/dL (ref >=40)
LDL Cholesterol (Calc): 44 mg/dL (calc)
Non-HDL Cholesterol (Calc): 69 mg/dL (calc) (ref <130)
Total CHOL/HDL Ratio: 2.7 (calc) (ref <5.0)
Triglycerides: 175 mg/dL — ABNORMAL HIGH (ref <150)

## 2020-09-27 LAB — TESTOSTERONE: Testosterone: 191 ng/dL — ABNORMAL LOW (ref 250–827)

## 2020-09-27 LAB — TSH: TSH: 3.55 mIU/L (ref 0.40–4.50)

## 2020-09-27 NOTE — Assessment & Plan Note (Signed)
hgba1c acceptable, minimize simple carbs. Increase exercise as tolerated.  

## 2020-09-27 NOTE — Progress Notes (Signed)
Subjective:    Patient ID: Elijah Marquez, male    DOB: 31-May-1971, 49 y.o.   MRN: [id number]  Chief Complaint  Patient presents with  . Follow-up    HPI Patient is in today for follow up on chronic medical concerns. Over all he is doing well. No recent febrile illness or hospitalizations. 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 Tries to eat well and stay active. Denies CP/palp/SOB/HA/congestion/fevers/GI or GU c/o. Taking meds as prescribed  Past Medical History:  Diagnosis Date  . Anxiety 08/23/2012  . Anxiety as acute reaction to exceptional stress 09/20/2012  . Chicken pox as a child  . Chronic lower back pain   . Fatigue   . History of tobacco abuse    Failed Chantix and wellbutrin   . Hyperlipidemia 08/23/2012  . Hypogonadism in male 10/02/2014  . Intermittent claudication (HCC) 08/13/2015   B/l legs  . Neck pain on left side 08/23/2012  . Osteoarthritis    "all over" (07/02/2017)  . Osteoarthritis of both knees   . Tobacco abuse     Past Surgical History:  Procedure Laterality Date  . ANTERIOR CERVICAL DECOMP/DISCECTOMY FUSION  12/08/2012   C4,5,,6  . BACK SURGERY    . CORONARY ANGIOPLASTY WITH STENT PLACEMENT  07/02/2017   "1 stent"  . CORONARY STENT INTERVENTION N/A 07/02/2017   Procedure: CORONARY STENT INTERVENTION;  Surgeon: Troy Sine, MD;  Location: [facility];  Service: Cardiovascular;  Laterality: N/A;  LAD/DIAG  . KNEE ARTHROSCOPY Bilateral 2011-2012   left-right  . LEFT HEART CATH AND CORONARY ANGIOGRAPHY N/A 07/02/2017   Procedure: Left Heart Cath and Coronary Angiography;  Surgeon: Troy Sine, MD;  Location: [facility];  Service: Cardiovascular;  Laterality: N/A;  . MULTIPLE TOOTH EXTRACTIONS    . WISDOM TOOTH EXTRACTION      Family History  Problem Relation Age of Onset  . Fibromyalgia Mother   . Coronary artery disease Mother   . Hypertension Mother   . Heart attack Mother   . Heart disease Mother        CAD  .  Peripheral vascular disease Mother 65       hemorrhage from vascular procedure  . Fibromyalgia Sister   . Hypertension Sister   . Heart disease Sister   . Other Son        mildly mentally handicap  . Seizures Son   . Lupus Sister   . Emphysema Sister        smoker  . Kidney disease Daughter   . Alcohol abuse Father     Social History   Socioeconomic History  . Marital status: Married    Spouse name: Not on file  . Number of children: Not on file  . Years of education: Not on file  . Highest education level: Not on file  Occupational History  . Not on file  Tobacco Use  . Smoking status: Former Smoker    Packs/day: 1.50    Years: 35.00    Pack years: 52.50    Types: Cigarettes    Quit date: 07/31/2015    Years since quitting: 5.1  . Smokeless tobacco: Never Used  Vaping Use  . Vaping Use: Some days  . Last attempt to quit: 06/16/2017  Substance and Sexual Activity  . Alcohol use: No  . Drug use: No  . Sexual activity: Not Currently    Partners: Female  Other Topics Concern  . Not on file  Social History Narrative  . Not on file   Social Determinants of Health   Financial Resource Strain:   . Difficulty of Paying Living Expenses: Not on file  Food Insecurity:   . Worried About Charity fundraiser in the Last Year: Not on file  . Ran Out of Food in the Last Year: Not on file  Transportation Needs:   . Lack of Transportation (Medical): Not on file  . Lack of Transportation (Non-Medical): Not on file  Physical Activity:   . Days of Exercise per Week: Not on file  . Minutes of Exercise per Session: Not on file  Stress:   . Feeling of Stress : Not on file  Social Connections:   . Frequency of Communication with Friends and Family: Not on file  . Frequency of Social Gatherings with Friends and Family: Not on file  . Attends Religious Services: Not on file  . Active Member of Clubs or Organizations: Not on file  . Attends Archivist Meetings: Not on  file  . Marital Status: Not on file  Intimate Partner Violence:   . Fear of Current or Ex-Partner: Not on file  . Emotionally Abused: Not on file  . Physically Abused: Not on file  . Sexually Abused: Not on file    Outpatient Medications Prior to Visit  Medication Sig Dispense Refill  . aspirin 81 MG tablet Take 1 tablet (81 mg total) by mouth daily. 30 tablet   . HYDROcodone-acetaminophen (NORCO) 10-325 MG tablet Take 1 tablet by mouth every 6 (six) hours as needed. 120 tablet 0  . nitroGLYCERIN (NITROSTAT) 0.4 MG SL tablet Place 1 tablet (0.4 mg total) under the tongue every 5 (five) minutes as needed for chest pain. To ER if no resolution 25 tablet 3  . rosuvastatin (CRESTOR) 40 MG tablet TAKE 1 TABLET BY MOUTH EVERY DAY 90 tablet 1  . ALPRAZolam (XANAX) 1 MG tablet Take 1 tablet (1 mg total) by mouth 2 (two) times daily as needed for sleep or anxiety. 60 tablet 2  . clopidogrel (PLAVIX) 75 MG tablet Take 1 tablet (75 mg total) by mouth daily. 90 tablet 1  . metoprolol tartrate (LOPRESSOR) 25 MG tablet TAKE 1 TABLET BY MOUTH TWICE A DAY 180 tablet 1  . Testosterone 1.62 % GEL Place 2 Pump onto the skin daily. (Patient taking differently: Place 4 Pump onto the skin daily. ) 75 g 5   No facility-administered medications prior to visit.    Allergies  Allergen Reactions  . Atorvastatin Other (See Comments)    Caused leg pain  . Penicillins Swelling and Rash    Has patient had a PCN reaction causing immediate rash, facial/tongue/throat swelling, SOB or lightheadedness with hypotension: Yes Has patient had a PCN reaction causing severe rash involving mucus membranes or skin necrosis: No Has patient had a PCN reaction that required hospitalization: Yes Has patient had a PCN reaction occurring within the last 10 years: No If all of the above answers are "NO", then may proceed with Cephalosporin use.     Review of Systems  Constitutional: Negative for fever and malaise/fatigue.  HENT:  Negative for congestion.   Eyes: Negative for blurred vision.  Respiratory: Negative for shortness of breath.   Cardiovascular: Negative for chest pain, palpitations and leg swelling.  Gastrointestinal: Negative for abdominal pain, blood in stool and nausea.  Genitourinary: Negative for dysuria and frequency.  Musculoskeletal: Positive for back pain and neck pain. Negative for falls.  Skin: Negative for rash.  Neurological: Negative for dizziness, loss of consciousness and headaches.  Endo/Heme/Allergies: Negative for environmental allergies.  Psychiatric/Behavioral: Negative for depression. The patient is not nervous/anxious.        Objective:    Physical Exam Vitals and nursing note reviewed.  Constitutional:      General: He is not in acute distress.    Appearance: He is well-developed.  HENT:     Head: Normocephalic and atraumatic.     Nose: Nose normal.  Eyes:     General:        Right eye: No discharge.        Left eye: No discharge.  Cardiovascular:     Rate and Rhythm: Normal rate and regular rhythm.     Heart sounds: No murmur heard.   Pulmonary:     Effort: Pulmonary effort is normal.     Breath sounds: Normal breath sounds.  Abdominal:     General: Bowel sounds are normal.     Palpations: Abdomen is soft.     Tenderness: There is no abdominal tenderness.  Musculoskeletal:     Cervical back: Normal range of motion and neck supple.  Skin:    General: Skin is warm and dry.  Neurological:     Mental Status: He is alert and oriented to person, place, and time.     BP 126/76 (BP Location: Right Arm)   Pulse 62   Temp 98 F (36.7 C) (Oral)   Resp 16   Wt 181 lb 9.6 oz (82.4 kg)   SpO2 95%   BMI 26.82 kg/m  Wt Readings from Last 3 Encounters:  09/26/20 181 lb 9.6 oz (82.4 kg)  06/26/20 177 lb 6.4 oz (80.5 kg)  09/06/19 189 lb (85.7 kg)    Diabetic Foot Exam - Simple   No data filed     Lab Results  Component Value Date   WBC 8.2 09/26/2020    HGB 14.5 09/26/2020   HCT 42.9 09/26/2020   PLT 210 09/26/2020   GLUCOSE 77 09/26/2020   CHOL 110 09/26/2020   TRIG 175 (H) 09/26/2020   HDL 41 09/26/2020   LDLDIRECT 47.0 08/19/2019   LDLCALC 44 09/26/2020   ALT 22 09/26/2020   AST 20 09/26/2020   NA 141 09/26/2020   K 5.1 09/26/2020   CL 102 09/26/2020   CREATININE 0.95 09/26/2020   BUN 12 09/26/2020   CO2 33 (H) 09/26/2020   TSH 3.55 09/26/2020   PSA 0.31 08/19/2019   INR 0.97 07/02/2017   HGBA1C 6.0 (H) 09/26/2020   MICROALBUR 0.7 08/25/2018    Lab Results  Component Value Date   TSH 3.55 09/26/2020   Lab Results  Component Value Date   WBC 8.2 09/26/2020   HGB 14.5 09/26/2020   HCT 42.9 09/26/2020   MCV 87.7 09/26/2020   PLT 210 09/26/2020   Lab Results  Component Value Date   NA 141 09/26/2020   K 5.1 09/26/2020   CO2 33 (H) 09/26/2020   GLUCOSE 77 09/26/2020   BUN 12 09/26/2020   CREATININE 0.95 09/26/2020   BILITOT 0.6 09/26/2020   ALKPHOS 67 08/19/2019   AST 20 09/26/2020   ALT 22 09/26/2020   PROT 7.1 09/26/2020   ALBUMIN 4.3 08/19/2019   CALCIUM 9.9 09/26/2020   ANIONGAP 7 07/03/2017   GFR 89.21 08/19/2019   Lab Results  Component Value Date   CHOL 110 09/26/2020   Lab Results  Component Value Date  HDL 41 09/26/2020   Lab Results  Component Value Date   LDLCALC 44 09/26/2020   Lab Results  Component Value Date   TRIG 175 (H) 09/26/2020   Lab Results  Component Value Date   CHOLHDL 2.7 09/26/2020   Lab Results  Component Value Date   HGBA1C 6.0 (H) 09/26/2020       Assessment & Plan:   Problem List Items Addressed This Visit    History of tobacco abuse    Continues to abstain      Hyperlipidemia - Primary    Encouraged heart healthy diet, increase exercise, avoid trans fats, consider a krill oil cap daily, tolerating Rosuvastatin.       Relevant Medications   metoprolol tartrate (LOPRESSOR) 25 MG tablet   sildenafil (REVATIO) 20 MG tablet   Other Relevant Orders    Lipid panel (Completed)   Hypogonadism in male    He had trouble with his insurance and he went without his testosterone for a period of time. Will maintain current dose of testosterone for now      Relevant Orders   Testosterone (Completed)   Essential hypertension    Well controlled, no changes to meds. Encouraged heart healthy diet such as the DASH diet and exercise as tolerated.       Relevant Medications   metoprolol tartrate (LOPRESSOR) 25 MG tablet   sildenafil (REVATIO) 20 MG tablet   Other Relevant Orders   CBC (Completed)   Comprehensive metabolic panel (Completed)   TSH (Completed)   Coronary artery disease involving native coronary artery of native heart with angina pectoris (HCC)   Relevant Medications   metoprolol tartrate (LOPRESSOR) 25 MG tablet   clopidogrel (PLAVIX) 75 MG tablet   sildenafil (REVATIO) 20 MG tablet   Hyperglycemia    hgba1c acceptable, minimize simple carbs. Increase exercise as tolerated.       Relevant Orders   Hemoglobin A1c (Completed)    Other Visit Diagnoses    Anxiety and depression       Relevant Medications   ALPRAZolam (XANAX) 1 MG tablet      I have changed Elijah Marquez's metoprolol tartrate. I am also having him start on sildenafil. Additionally, I am having him maintain his aspirin, nitroGLYCERIN, HYDROcodone-acetaminophen, rosuvastatin, clopidogrel, ALPRAZolam, and Testosterone.  Meds ordered this encounter  Medications  . metoprolol tartrate (LOPRESSOR) 25 MG tablet    Sig: Take 1 tablet (25 mg total) by mouth 2 (two) times daily.    Dispense:  180 tablet    Refill:  1  . clopidogrel (PLAVIX) 75 MG tablet    Sig: Take 1 tablet (75 mg total) by mouth daily.    Dispense:  90 tablet    Refill:  1  . ALPRAZolam (XANAX) 1 MG tablet    Sig: Take 1 tablet (1 mg total) by mouth 2 (two) times daily as needed for sleep or anxiety.    Dispense:  60 tablet    Refill:  2  . Testosterone 1.62 % GEL    Sig: Place 2 Pump  onto the skin daily.    Dispense:  75 g    Refill:  5  . sildenafil (REVATIO) 20 MG tablet    Sig: Take 1-4 tablets (20-80 mg total) by mouth daily as needed.    Dispense:  30 tablet    Refill:  1     Penni Homans, MD

## 2020-09-27 NOTE — Assessment & Plan Note (Signed)
He had trouble with his insurance and he went without his testosterone for a period of time. Will maintain current dose of testosterone for now

## 2020-09-27 NOTE — Assessment & Plan Note (Signed)
Encouraged heart healthy diet, increase exercise, avoid trans fats, consider a krill oil cap daily, tolerating Rosuvastatin 

## 2020-09-27 NOTE — Assessment & Plan Note (Signed)
Well controlled, no changes to meds. Encouraged heart healthy diet such as the DASH diet and exercise as tolerated.  °

## 2020-09-27 NOTE — Assessment & Plan Note (Signed)
Continues to abstain 

## 2020-10-15 ENCOUNTER — Other Ambulatory Visit: Payer: Self-pay | Admitting: Family Medicine

## 2020-10-16 MED ORDER — HYDROCODONE-ACETAMINOPHEN 10-325 MG PO TABS: 1.0000 | ORAL_TABLET | Freq: Four times a day (QID) | ORAL | 0 refills | Status: DC | PRN

## 2020-10-16 NOTE — Telephone Encounter (Signed)
Requesting: hydrocodone 10-325mg  Contract: 06/26/2020  UDS: 08/19/2019 Last Visit: 09/26/2020  Next Visit: 02/06/2021 Last Refill: 09/13/2020 #120 and 0RF Pt sig: 1 tb q6h prn  Please Advise

## 2020-10-20 ENCOUNTER — Other Ambulatory Visit: Payer: Self-pay | Admitting: Family Medicine

## 2020-10-20 NOTE — Telephone Encounter (Signed)
Requesting:SILDENAFIL 20 MG TABLET Contract:08/23/18 UDS:08/23/18 Last Visit:09/26/20 Next Visit:02/06/21 Last Refill:09/26/20  Please Advise

## 2020-11-03 ENCOUNTER — Other Ambulatory Visit: Payer: Self-pay | Admitting: Family Medicine

## 2020-11-13 ENCOUNTER — Other Ambulatory Visit: Payer: Self-pay | Admitting: Family Medicine

## 2020-11-15 MED ORDER — HYDROCODONE-ACETAMINOPHEN 10-325 MG PO TABS
1.0000 | ORAL_TABLET | Freq: Four times a day (QID) | ORAL | 0 refills | Status: DC | PRN
Start: 2020-11-15 — End: 2020-12-14

## 2020-11-15 NOTE — Telephone Encounter (Signed)
Requesting:  NORCO Contract:n/a UDS:08/2019 Last Visit:06/2020 Next Visit: Last Refill:10/16/20  Please Advise

## 2020-11-15 NOTE — Telephone Encounter (Signed)
I have refilled but he will need an appt before he gets further refills

## 2020-12-14 ENCOUNTER — Other Ambulatory Visit: Payer: Self-pay | Admitting: Family Medicine

## 2020-12-14 MED ORDER — HYDROCODONE-ACETAMINOPHEN 10-325 MG PO TABS: 1.0000 | ORAL_TABLET | Freq: Four times a day (QID) | ORAL | 0 refills | Status: DC | PRN

## 2020-12-14 MED ORDER — HYDROCODONE-ACETAMINOPHEN 10-325 MG PO TABS
1.0000 | ORAL_TABLET | Freq: Four times a day (QID) | ORAL | 0 refills | Status: DC | PRN
Start: 2020-12-14 — End: 2021-01-16

## 2020-12-14 NOTE — Addendum Note (Signed)
Addended byDamita Dunnings D on: 12/14/2020 02:11 PM   Modules accepted: Orders

## 2020-12-14 NOTE — Telephone Encounter (Signed)
Requesting:Norcon10-325mg  Contract:01/23/18 UDS:08/1719 Last Visit:09/26/20 Next Visit:02/06/21 Last Refill:11/15/20  Please Advise

## 2020-12-14 NOTE — Telephone Encounter (Signed)
Can you resend? Transmission failed to pharmacy.

## 2020-12-31 ENCOUNTER — Other Ambulatory Visit: Payer: Self-pay | Admitting: Family Medicine

## 2021-01-16 ENCOUNTER — Other Ambulatory Visit: Payer: Self-pay | Admitting: Family Medicine

## 2021-01-16 MED ORDER — HYDROCODONE-ACETAMINOPHEN 10-325 MG PO TABS: 1.0000 | ORAL_TABLET | Freq: Four times a day (QID) | ORAL | 0 refills | Status: DC | PRN

## 2021-01-16 NOTE — Telephone Encounter (Signed)
Requesting: hydrocodone 10-325mg  Contract: 06/26/2020 UDS: 08/19/2019 Last Visit: 09/26/2020 Next Visit: 02/06/2021 Last Refill: 12/14/2020 #120 and 0RF Pt sig: 1 tab q6h prn  Please Advise

## 2021-02-06 ENCOUNTER — Ambulatory Visit: Payer: BC Managed Care – PPO | Admitting: Family Medicine

## 2021-02-06 DIAGNOSIS — Z0289 Encounter for other administrative examinations: Secondary | ICD-10-CM

## 2021-02-10 ENCOUNTER — Other Ambulatory Visit: Payer: Self-pay | Admitting: Family Medicine

## 2021-02-12 MED ORDER — HYDROCODONE-ACETAMINOPHEN 10-325 MG PO TABS: 1.0000 | ORAL_TABLET | Freq: Four times a day (QID) | ORAL | 0 refills | Status: DC | PRN

## 2021-02-12 NOTE — Telephone Encounter (Signed)
Requesting:hydrocodone Contract: 01/23/18 UDS: 01/23/18 Last Visit: 09/26/20 Next Visit: 05/22/21 Last Refill: 01/16/21  Please Advise

## 2021-02-14 ENCOUNTER — Other Ambulatory Visit: Payer: Self-pay | Admitting: Family Medicine

## 2021-02-15 NOTE — Telephone Encounter (Signed)
You refilled on 02/12/21- can you deny pleasE?

## 2021-03-19 ENCOUNTER — Other Ambulatory Visit: Payer: Self-pay | Admitting: Family Medicine

## 2021-03-19 MED ORDER — HYDROCODONE-ACETAMINOPHEN 10-325 MG PO TABS
1.0000 | ORAL_TABLET | Freq: Four times a day (QID) | ORAL | 0 refills | Status: DC | PRN
Start: 2021-03-19 — End: 2021-04-16

## 2021-03-19 NOTE — Telephone Encounter (Signed)
Requesting: hydrocodone 10-325mg   Contract: 06/26/2020 UDS: 08/19/2019 Last Visit: 09/26/2020 Next Visit: 05/22/21 Last Refill: 02/12/2021 #120 and 0RF Pt sig: 1 tab q6h prn  Please Advise

## 2021-03-27 ENCOUNTER — Other Ambulatory Visit: Payer: Self-pay | Admitting: Family Medicine

## 2021-03-27 DIAGNOSIS — I25119 Atherosclerotic heart disease of native coronary artery with unspecified angina pectoris: Secondary | ICD-10-CM

## 2021-03-30 ENCOUNTER — Other Ambulatory Visit: Payer: Self-pay | Admitting: Family Medicine

## 2021-04-16 ENCOUNTER — Other Ambulatory Visit: Payer: Self-pay | Admitting: Family Medicine

## 2021-04-16 MED ORDER — HYDROCODONE-ACETAMINOPHEN 10-325 MG PO TABS
1.0000 | ORAL_TABLET | Freq: Four times a day (QID) | ORAL | 0 refills | Status: DC | PRN
Start: 2021-04-16 — End: 2021-05-17

## 2021-04-16 NOTE — Telephone Encounter (Signed)
Requesting: hydrocodone 10-325mg  Contract: 06/26/2020 UDS: 08/19/2019 Last Visit: 09/26/2020 Next Visit: 05/22/21 Last Refill: 03/19/2021 #120 and 0RF Pt sig: 1 tab q6h prn   Please Advise

## 2021-04-18 ENCOUNTER — Other Ambulatory Visit: Payer: Self-pay | Admitting: Family Medicine

## 2021-04-18 NOTE — Telephone Encounter (Signed)
Requesting: testosterone 1.62% gel Contract: n/a UDS: n/a Last Visit: 09/26/2020 Next Visit: 05/22/2021 Last Refill: 09/26/2020 #75g and 5rf  Please Advise

## 2021-04-20 ENCOUNTER — Other Ambulatory Visit: Payer: Self-pay | Admitting: Family Medicine

## 2021-04-20 DIAGNOSIS — F32A Depression, unspecified: Secondary | ICD-10-CM

## 2021-04-20 NOTE — Telephone Encounter (Signed)
Requesting: alprazolam Contract: 01/23/18 (will get at next appt) UDS: 01/23/18 (will get at next appt) Last Visit: 06/26/20 Next Visit: 05/02/21  Last Refill: 09/26/20  Please Advise

## 2021-05-17 ENCOUNTER — Other Ambulatory Visit: Payer: Self-pay | Admitting: Family Medicine

## 2021-05-17 MED ORDER — HYDROCODONE-ACETAMINOPHEN 10-325 MG PO TABS: 1.0000 | ORAL_TABLET | Freq: Four times a day (QID) | ORAL | 0 refills | Status: DC | PRN

## 2021-05-17 NOTE — Telephone Encounter (Signed)
Requesting: hydrocodone 10-325mg  Contract: 06/26/2020 UDS: 08/19/2019 Last Visit: 09/26/2020 Next Visit: 05/22/2021 Last Refill: 04/16/2021 #120 and 0RF  Please Advise

## 2021-05-21 ENCOUNTER — Telehealth: Payer: Self-pay | Admitting: *Deleted

## 2021-05-21 ENCOUNTER — Other Ambulatory Visit: Payer: Self-pay | Admitting: Family Medicine

## 2021-05-21 MED ORDER — HYDROCODONE-ACETAMINOPHEN 10-300 MG PO TABS: 1.0000 | ORAL_TABLET | Freq: Four times a day (QID) | ORAL | 0 refills | Status: DC | PRN

## 2021-05-21 NOTE — Telephone Encounter (Signed)
Pharmacy faxed over stating that hydrocodone/apap: product backordered/unavailable.  The alternative would be hydrocodone 10/300.

## 2021-05-22 ENCOUNTER — Ambulatory Visit: Payer: PRIVATE HEALTH INSURANCE | Admitting: Family Medicine

## 2021-05-22 ENCOUNTER — Other Ambulatory Visit: Payer: Self-pay

## 2021-05-22 VITALS — BP 136/80 | HR 66 | Temp 98.4°F | Resp 16 | Wt 186.6 lb

## 2021-05-22 DIAGNOSIS — E291 Testicular hypofunction: Secondary | ICD-10-CM | POA: Diagnosis not present

## 2021-05-22 DIAGNOSIS — Z79899 Other long term (current) drug therapy: Secondary | ICD-10-CM | POA: Diagnosis not present

## 2021-05-22 DIAGNOSIS — E782 Mixed hyperlipidemia: Secondary | ICD-10-CM | POA: Diagnosis not present

## 2021-05-22 DIAGNOSIS — R351 Nocturia: Secondary | ICD-10-CM

## 2021-05-22 DIAGNOSIS — M542 Cervicalgia: Secondary | ICD-10-CM

## 2021-05-22 DIAGNOSIS — M170 Bilateral primary osteoarthritis of knee: Secondary | ICD-10-CM

## 2021-05-22 DIAGNOSIS — I10 Essential (primary) hypertension: Secondary | ICD-10-CM | POA: Diagnosis not present

## 2021-05-22 DIAGNOSIS — R739 Hyperglycemia, unspecified: Secondary | ICD-10-CM

## 2021-05-22 DIAGNOSIS — Z1211 Encounter for screening for malignant neoplasm of colon: Secondary | ICD-10-CM

## 2021-05-22 NOTE — Progress Notes (Signed)
Patient ID: Elijah Marquez, male    DOB: [birth year]  Age: 50 y.o. MRN: [id number]    Subjective:  Subjective  HPI Elijah Marquez presents for office visit today for follow up on osteoarthritis of both knees and CAD. He reports that he has had surgery on both knees, but at the moment has not had knee replacement surgeries. He states that even since his spine injury due to a fridge falling on him, he has been experiencing migraines. He denies any chest pain, SOB, fever, abdominal pain, cough, chills, sore throat, dysuria, urinary incontinence, back pain, HA, or N/VD.   Review of Systems  Constitutional:  Negative for chills, fatigue and fever.  HENT:  Negative for congestion, rhinorrhea, sinus pressure, sinus pain and sore throat.   Eyes:  Negative for pain.  Respiratory:  Negative for cough and shortness of breath.   Cardiovascular:  Negative for chest pain, palpitations and leg swelling.  Gastrointestinal:  Negative for abdominal pain, blood in stool, diarrhea, nausea and vomiting.  Genitourinary:  Negative for flank pain, frequency and penile pain.  Musculoskeletal:  Positive for arthralgias (bilateral knees). Negative for back pain.  Neurological:  Positive for headaches (migraines secondary to spine injury).   History Past Medical History:  Diagnosis Date   Anxiety 08/23/2012   Anxiety as acute reaction to exceptional stress 09/20/2012   Chicken pox as a child   Chronic lower back pain    Fatigue    History of tobacco abuse    Failed Chantix and wellbutrin    Hyperlipidemia 08/23/2012   Hypogonadism in male 10/02/2014   Intermittent claudication ([place]) 08/13/2015   B/l legs   Neck pain on left side 08/23/2012   Osteoarthritis    "all over" (07/02/2017)   Osteoarthritis of both knees    Tobacco abuse     He has a past surgical history that includes Wisdom tooth extraction; Knee arthroscopy (Bilateral, 2011-2012); Anterior cervical decomp/discectomy fusion (12/08/2012); Back surgery;  Multiple tooth extractions; Coronary angioplasty with stent (07/02/2017); LEFT HEART CATH AND CORONARY ANGIOGRAPHY (N/A, 07/02/2017); and CORONARY STENT INTERVENTION (N/A, 07/02/2017).   His family history includes Alcohol abuse in his father; Coronary artery disease in his mother; Emphysema in his sister; Fibromyalgia in his mother and sister; Heart attack in his mother; Heart disease in his mother and sister; Hypertension in his mother and sister; Kidney disease in his daughter; Lupus in his sister; Other in his son; Peripheral vascular disease (age of onset: 43) in his mother; Seizures in his son.He reports that he quit smoking about 5 years ago. His smoking use included cigarettes. He has a 52.50 pack-year smoking history. He has never used smokeless tobacco. He reports that he does not drink alcohol and does not use drugs.  Current Outpatient Medications on File Prior to Visit  Medication Sig Dispense Refill   ALPRAZolam (XANAX) 1 MG tablet TAKE 1 TABLET (1 MG TOTAL) BY MOUTH 2 (TWO) TIMES DAILY AS NEEDED FOR SLEEP OR ANXIETY. 60 tablet 1   clopidogrel (PLAVIX) 75 MG tablet TAKE 1 TABLET BY MOUTH EVERY DAY 90 tablet 1   HYDROcodone-Acetaminophen 10-300 MG TABS Take 1 tablet by mouth every 6 (six) hours as needed. 120 tablet 0   metoprolol tartrate (LOPRESSOR) 25 MG tablet TAKE 1 TABLET BY MOUTH TWICE A DAY 180 tablet 1   nitroGLYCERIN (NITROSTAT) 0.4 MG SL tablet Place 1 tablet (0.4 mg total) under the tongue every 5 (five) minutes as needed for chest pain. To ER if no resolution  25 tablet 3   rosuvastatin (CRESTOR) 40 MG tablet Take 1 tablet (40 mg total) by mouth daily. 90 tablet 1   Testosterone 20.25 MG/ACT (1.62%) GEL PLACE 2 PUMP ONTO THE SKIN DAILY. 75 g 1   sildenafil (REVATIO) 20 MG tablet TAKE 1-4 TABLETS (20-80 MG TOTAL) BY MOUTH DAILY AS NEEDED. 90 tablet 1   No current facility-administered medications on file prior to visit.     Objective:  Objective  Physical Exam Constitutional:       General: He is not in acute distress.    Appearance: Normal appearance. He is not ill-appearing or toxic-appearing.  HENT:     Head: Normocephalic and atraumatic.     Right Ear: Tympanic membrane, ear canal and external ear normal.     Left Ear: Tympanic membrane, ear canal and external ear normal.     Nose: No congestion or rhinorrhea.  Eyes:     Extraocular Movements: Extraocular movements intact.     Pupils: Pupils are equal, round, and reactive to light.  Cardiovascular:     Rate and Rhythm: Normal rate and regular rhythm.     Pulses: Normal pulses.     Heart sounds: Normal heart sounds. No murmur heard. Pulmonary:     Effort: Pulmonary effort is normal. No respiratory distress.     Breath sounds: Normal breath sounds. No wheezing, rhonchi or rales.  Abdominal:     General: Bowel sounds are normal.     Palpations: Abdomen is soft. There is no mass.     Tenderness: no abdominal tenderness There is no guarding.     Hernia: No hernia is present.  Musculoskeletal:        General: Normal range of motion.     Cervical back: Normal range of motion and neck supple.  Skin:    General: Skin is warm and dry.  Neurological:     Mental Status: He is alert and oriented to person, place, and time.  Psychiatric:        Behavior: Behavior normal.   BP 136/80   Pulse 66   Temp 98.4 F (36.9 C)   Resp 16   Wt 186 lb 9.6 oz (84.6 kg)   SpO2 97%   BMI 27.56 kg/m  Wt Readings from Last 3 Encounters:  05/22/21 186 lb 9.6 oz (84.6 kg)  09/26/20 181 lb 9.6 oz (82.4 kg)  06/26/20 177 lb 6.4 oz (80.5 kg)     Lab Results  Component Value Date   WBC 8.9 05/22/2021   HGB 15.2 05/22/2021   HCT 44.3 05/22/2021   PLT 204.0 05/22/2021   GLUCOSE 87 05/22/2021   CHOL 125 05/22/2021   TRIG 151.0 (H) 05/22/2021   HDL 44.20 05/22/2021   LDLDIRECT 47.0 08/19/2019   LDLCALC 51 05/22/2021   ALT 29 05/22/2021   AST 22 05/22/2021   NA 138 05/22/2021   K 4.0 05/22/2021   CL 100  05/22/2021   CREATININE 0.81 05/22/2021   BUN 12 05/22/2021   CO2 29 05/22/2021   TSH 2.10 05/22/2021   PSA 0.34 05/22/2021   INR 0.97 07/02/2017   HGBA1C 6.0 05/22/2021   MICROALBUR 0.7 08/25/2018    VAS [place] ABI WITH/WO TBI  Result Date: 09/16/2019 LOWER EXTREMITY DOPPLER STUDY Indications: Claudication. High Risk Factors: Hypertension, hyperlipidemia, coronary artery disease.  Performing Technologist: Cardell Peach RDCS, RVT  Examination Guidelines: A complete evaluation includes at minimum, Doppler waveform signals and systolic blood pressure reading at the level of bilateral  brachial, anterior tibial, and posterior tibial arteries, when vessel segments are accessible. Bilateral testing is considered an integral part of a complete examination. Photoelectric Plethysmograph (PPG) waveforms and toe systolic pressure readings are included as required and additional duplex testing as needed. Limited examinations for reoccurring indications may be performed as noted.  ABI Findings: +---------+------------------+-----+---------+--------+ Right    Rt Pressure (mmHg)IndexWaveform Comment  +---------+------------------+-----+---------+--------+ Brachial 125                    triphasic         +---------+------------------+-----+---------+--------+ ATA      138               1.10 triphasic         +---------+------------------+-----+---------+--------+ PTA      134               1.07 triphasic         +---------+------------------+-----+---------+--------+ Great Toe86                0.69 Normal            +---------+------------------+-----+---------+--------+ +---------+------------------+-----+---------+-------+ Left     Lt Pressure (mmHg)IndexWaveform Comment +---------+------------------+-----+---------+-------+ Brachial 121                    triphasic        +---------+------------------+-----+---------+-------+ ATA      118               0.94 triphasic         +---------+------------------+-----+---------+-------+ PTA      134               1.07 triphasic        +---------+------------------+-----+---------+-------+ Great Toe92                0.74 Normal           +---------+------------------+-----+---------+-------+  Summary: Right: Resting right ankle-brachial index is within normal range. No evidence of significant right lower extremity arterial disease. The right toe-brachial index is abnormal. Left: Resting left ankle-brachial index is within normal range. No evidence of significant left lower extremity arterial disease. The left toe-brachial index is normal.  *See table(s) above for measurements and observations.  Electronically signed by Jenne Campus MD on 09/16/2019 at 10:16:17 AM.   Final      Assessment & Plan:  Plan    No orders of the defined types were placed in this encounter.   Problem List Items Addressed This Visit     Osteoarthritis of both knees    Is struggling with significant knee pain but so far has chosen not to proceed with a total knee replacement so far.        Neck pain on left side    Continues to struggle with neck pain and left upper extremity radiculopathy. Medications help him manage daily, no changes       Hyperlipidemia    Tolerating statin, encouraged heart healthy diet, avoid trans fats, minimize simple carbs and saturated fats. Increase exercise as tolerated       Relevant Orders   Lipid panel (Completed)   Hypogonadism in male    Testosterone improving but still low increase the testosteorne gel 1.62% to 3 pumps daily       Relevant Orders   Testosterone (Completed)   Essential hypertension    Well controlled, no changes to meds. Encouraged heart healthy diet such as the DASH diet and exercise as tolerated.  Relevant Orders   CBC (Completed)   Comprehensive metabolic panel (Completed)   TSH (Completed)   Hyperglycemia    hgba1c acceptable, minimize simple carbs.  Increase exercise as tolerated.        Relevant Orders   Hemoglobin A1c (Completed)   Other Visit Diagnoses     High risk medication use    -  Primary   Relevant Orders   DRUG MONITORING, PANEL 8 WITH CONFIRMATION, URINE   Nocturia       Relevant Orders   PSA (Completed)   Colon cancer screening       Relevant Orders   Ambulatory referral to Gastroenterology       Follow-up: Return in about 7 months (around 12/22/2021).   I,David Hanna,acting as a scribe for Penni Homans, MD.,have documented all relevant documentation on behalf of Penni Homans, MD,as directed by Penni Homans, MD while in the presence of Penni Homans, MD.  I, [name], MD personally performed the services described in this documentation. All medical record entries made by the scribe were at my direction and in my presence. I have reviewed the chart and agree that the record reflects my personal performance and is accurate and complete

## 2021-05-22 NOTE — Patient Instructions (Signed)

## 2021-05-22 NOTE — Assessment & Plan Note (Signed)
Well controlled, no changes to meds. Encouraged heart healthy diet such as the DASH diet and exercise as tolerated.  °

## 2021-05-23 LAB — COMPREHENSIVE METABOLIC PANEL
ALT: 29 U/L (ref 0–53)
AST: 22 U/L (ref 0–37)
Albumin: 5 g/dL (ref 3.5–5.2)
Alkaline Phosphatase: 65 U/L (ref 39–117)
BUN: 12 mg/dL (ref 6–23)
CO2: 29 mEq/L (ref 19–32)
Calcium: 10.1 mg/dL (ref 8.4–10.5)
Chloride: 100 mEq/L (ref 96–112)
Creatinine, Ser: 0.81 mg/dL (ref 0.40–1.50)
GFR: 101.7 mL/min (ref >60.00)
Glucose, Bld: 87 mg/dL (ref 70–99)
Potassium: 4 mEq/L (ref 3.5–5.1)
Sodium: 138 mEq/L (ref 135–145)
Total Bilirubin: 0.7 mg/dL (ref 0.2–1.2)
Total Protein: 7.5 g/dL (ref 6.0–8.3)

## 2021-05-23 LAB — CBC
HCT: 44.3 % (ref 39.0–52.0)
Hemoglobin: 15.2 g/dL (ref 13.0–17.0)
MCHC: 34.2 g/dL (ref 30.0–36.0)
MCV: 86.8 fl (ref 78.0–100.0)
Platelets: 204 10*3/uL (ref 150.0–400.0)
RBC: 5.1 Mil/uL (ref 4.22–5.81)
RDW: 12.7 % (ref 11.5–15.5)
WBC: 8.9 10*3/uL (ref 4.0–10.5)

## 2021-05-23 LAB — LIPID PANEL
Cholesterol: 125 mg/dL (ref 0–200)
HDL: 44.2 mg/dL (ref >39.00)
LDL Cholesterol: 51 mg/dL (ref 0–99)
NonHDL: 80.88
Total CHOL/HDL Ratio: 3
Triglycerides: 151 mg/dL — ABNORMAL HIGH (ref 0.0–149.0)
VLDL: 30.2 mg/dL (ref 0.0–40.0)

## 2021-05-23 LAB — TSH: TSH: 2.1 u[IU]/mL (ref 0.35–4.50)

## 2021-05-23 LAB — PSA: PSA: 0.34 ng/mL (ref 0.10–4.00)

## 2021-05-23 LAB — HEMOGLOBIN A1C: Hgb A1c MFr Bld: 6 % (ref 4.6–6.5)

## 2021-05-23 LAB — TESTOSTERONE: Testosterone: 261.2 ng/dL — ABNORMAL LOW (ref 300.00–890.00)

## 2021-05-23 NOTE — Assessment & Plan Note (Signed)
Tolerating statin, encouraged heart healthy diet, avoid trans fats, minimize simple carbs and saturated fats. Increase exercise as tolerated 

## 2021-05-23 NOTE — Assessment & Plan Note (Signed)
hgba1c acceptable, minimize simple carbs. Increase exercise as tolerated.  

## 2021-05-23 NOTE — Assessment & Plan Note (Signed)
Testosterone improving but still low increase the testosteorne gel 1.62% to 3 pumps daily

## 2021-05-23 NOTE — Assessment & Plan Note (Signed)
Is struggling with significant knee pain but so far has chosen not to proceed with a total knee replacement so far.

## 2021-05-23 NOTE — Assessment & Plan Note (Signed)
Continues to struggle with neck pain and left upper extremity radiculopathy. Medications help him manage daily, no changes

## 2021-05-24 ENCOUNTER — Other Ambulatory Visit: Payer: Self-pay | Admitting: *Deleted

## 2021-05-24 DIAGNOSIS — E291 Testicular hypofunction: Secondary | ICD-10-CM

## 2021-05-24 MED ORDER — TESTOSTERONE 20.25 MG/ACT (1.62%) TD GEL: TRANSDERMAL | 3 refills | Status: DC

## 2021-05-25 ENCOUNTER — Telehealth: Payer: Self-pay | Admitting: Family Medicine

## 2021-05-25 NOTE — Telephone Encounter (Signed)
Patient states pharmacy now have the 10-325 Hydrocodone. Patient would like prescription to be re-send.There is also a $100 dollars different between them.    HYDROcodone-Acetaminophen 10-325 MG TABS   [id number] [place], [address] [place], [place] [address]  Phone:  [phone]  Fax:  [phone]

## 2021-05-25 NOTE — Telephone Encounter (Signed)
Called pt to see if he picked up the HYDROcodone-Acetaminophen 10-300 MG TABS because he would not be able until next month to get the HYDROcodone-Acetaminophen 10-352 mg.

## 2021-05-27 LAB — DRUG MONITORING, PANEL 8 WITH CONFIRMATION, URINE
6 Acetylmorphine: NEGATIVE ng/mL (ref <10)
Alcohol Metabolites: NEGATIVE ng/mL (ref <500)
Alphahydroxyalprazolam: 243 ng/mL — ABNORMAL HIGH (ref <25)
Alphahydroxymidazolam: NEGATIVE ng/mL (ref <50)
Alphahydroxytriazolam: NEGATIVE ng/mL (ref <50)
Aminoclonazepam: NEGATIVE ng/mL (ref <25)
Amphetamines: NEGATIVE ng/mL (ref <500)
Benzodiazepines: POSITIVE ng/mL — AB (ref <100)
Buprenorphine, Urine: NEGATIVE ng/mL (ref <5)
Cocaine Metabolite: NEGATIVE ng/mL (ref <150)
Codeine: NEGATIVE ng/mL (ref <50)
Creatinine: 85.9 mg/dL (ref >=20.0)
Hydrocodone: 98 ng/mL — ABNORMAL HIGH (ref <50)
Hydromorphone: 54 ng/mL — ABNORMAL HIGH (ref <50)
Hydroxyethylflurazepam: NEGATIVE ng/mL (ref <50)
Lorazepam: NEGATIVE ng/mL (ref <50)
MDMA: NEGATIVE ng/mL (ref <500)
Marijuana Metabolite: NEGATIVE ng/mL (ref <20)
Morphine: NEGATIVE ng/mL (ref <50)
Nordiazepam: NEGATIVE ng/mL (ref <50)
Norhydrocodone: 353 ng/mL — ABNORMAL HIGH (ref <50)
Opiates: POSITIVE ng/mL — AB (ref <100)
Oxazepam: NEGATIVE ng/mL (ref <50)
Oxidant: NEGATIVE ug/mL (ref <200)
Oxycodone: NEGATIVE ng/mL (ref <100)
Temazepam: NEGATIVE ng/mL (ref <50)
pH: 5.5 (ref 4.5–9.0)

## 2021-05-27 LAB — DM TEMPLATE

## 2021-05-29 NOTE — Telephone Encounter (Signed)
Please see original message.

## 2021-05-30 ENCOUNTER — Other Ambulatory Visit: Payer: Self-pay | Admitting: Family Medicine

## 2021-05-30 MED ORDER — HYDROCODONE-ACETAMINOPHEN 10-325 MG PO TABS: 1.0000 | ORAL_TABLET | Freq: Three times a day (TID) | ORAL | 0 refills | Status: DC | PRN

## 2021-05-30 NOTE — Telephone Encounter (Signed)
Patient did pick up the 10-300s on 05/23/21 so the new rx that we just sent in was cancelled.  Left detailed message on machine when he is due for next refill to check with pharmacist before calling us.

## 2021-06-21 ENCOUNTER — Other Ambulatory Visit: Payer: Self-pay | Admitting: Family Medicine

## 2021-06-21 MED ORDER — HYDROCODONE-ACETAMINOPHEN 10-325 MG PO TABS: 1.0000 | ORAL_TABLET | Freq: Three times a day (TID) | ORAL | 0 refills | Status: DC | PRN

## 2021-06-21 NOTE — Telephone Encounter (Signed)
Medication: HYDROcodone-acetaminophen (NORCO) 10-325 MG tablet   Has the patient contacted their pharmacy? No. (If no, request that the patient contact the pharmacy for the refill.) (If yes, when and what did the pharmacy advise?)  Preferred Pharmacy (with phone number or street name): [id number] [place], [place] - [address]  [address], [place] [address]  Phone:  [phone]  Fax:  [phone]   Agent: Please be advised that RX refills may take up to 3 business days. We ask that you follow-up with your pharmacy.

## 2021-06-21 NOTE — Telephone Encounter (Signed)
Requesting: hydrocodone 10-325mg  Contract: 05/22/2021 UDS: 05/22/2021 Last Visit: 05/22/2021 Next Visit: 11/22/2021  Last Refill: 05/30/2021 #60 and 0RF  Please Advise

## 2021-06-26 ENCOUNTER — Telehealth: Payer: Self-pay | Admitting: *Deleted

## 2021-06-26 NOTE — Telephone Encounter (Signed)
PA# [employer] [id number] This request is approved from 06/26/2021 to 06/26/2022

## 2021-06-26 NOTE — Telephone Encounter (Signed)
Prior auth started via cover my meds.  Awaiting determination.  Key: Zollie Beckers

## 2021-07-23 ENCOUNTER — Telehealth: Payer: Self-pay | Admitting: Family Medicine

## 2021-07-23 NOTE — Telephone Encounter (Signed)
Medication: HYDROcodone-acetaminophen (NORCO) 10-325 MG tablet   Has the patient contacted their pharmacy? No. (If no, request that the patient contact the pharmacy for the refill.) (If yes, when and what did the pharmacy advise?)  Preferred Pharmacy (with phone number or street name): [id number] [place] Phone:  [phone]   Agent: Please be advised that RX refills may take up to 3 business days. We ask that you follow-up with your pharmacy.

## 2021-07-24 ENCOUNTER — Other Ambulatory Visit: Payer: Self-pay | Admitting: Family Medicine

## 2021-07-24 MED ORDER — HYDROCODONE-ACETAMINOPHEN 10-325 MG PO TABS: 1.0000 | ORAL_TABLET | Freq: Three times a day (TID) | ORAL | 0 refills | Status: DC | PRN

## 2021-07-24 NOTE — Telephone Encounter (Signed)
Requesting: norco 10-325 mg Contract:05/22/21 UDS:05/22/21 Last Visit:05/22/21 Next Visit:11/12/21 Last Refill:06/21/21  Please Advise

## 2021-08-15 ENCOUNTER — Other Ambulatory Visit: Payer: Self-pay | Admitting: Family Medicine

## 2021-08-15 ENCOUNTER — Telehealth: Payer: Self-pay

## 2021-08-15 DIAGNOSIS — I25119 Atherosclerotic heart disease of native coronary artery with unspecified angina pectoris: Secondary | ICD-10-CM

## 2021-08-15 DIAGNOSIS — F32A Depression, unspecified: Secondary | ICD-10-CM

## 2021-08-15 DIAGNOSIS — F419 Anxiety disorder, unspecified: Secondary | ICD-10-CM

## 2021-08-15 NOTE — Telephone Encounter (Signed)
Requesting:xanax 1 mg Contract:05/22/20 UDS:05/22/20 Last Visit:05/22/21 Next Visit:11/22/21 Last Refill:04/21/21  Please Advise

## 2021-08-15 NOTE — Telephone Encounter (Signed)
Requesting: xanax 1 mg Contract:05/22/21 UDS:05/22/21 Last Visit:05/22/21 Next Visit:11/22/21 Last Refill:04/21/21  Please Advise

## 2021-08-27 ENCOUNTER — Other Ambulatory Visit: Payer: Self-pay | Admitting: Family Medicine

## 2021-08-28 MED ORDER — HYDROCODONE-ACETAMINOPHEN 10-325 MG PO TABS: 1.0000 | ORAL_TABLET | Freq: Three times a day (TID) | ORAL | 0 refills | Status: DC | PRN

## 2021-08-28 NOTE — Telephone Encounter (Signed)
Requesting: hydrocodone 10-325mg  Contract: 05/22/2021 UDS: 05/22/2021 Last Visit: 05/22/2021 Next Visit: 11/22/2021 Last Refill: 07/24/2021 #60 and 0RF  Please Advise

## 2021-09-12 DIAGNOSIS — I2510 Atherosclerotic heart disease of native coronary artery without angina pectoris: Secondary | ICD-10-CM | POA: Diagnosis not present

## 2021-09-12 DIAGNOSIS — R0602 Shortness of breath: Secondary | ICD-10-CM | POA: Diagnosis not present

## 2021-09-20 DIAGNOSIS — E782 Mixed hyperlipidemia: Secondary | ICD-10-CM | POA: Diagnosis not present

## 2021-09-20 DIAGNOSIS — I25119 Atherosclerotic heart disease of native coronary artery with unspecified angina pectoris: Secondary | ICD-10-CM | POA: Diagnosis not present

## 2021-09-20 DIAGNOSIS — I10 Essential (primary) hypertension: Secondary | ICD-10-CM | POA: Diagnosis not present

## 2021-09-20 DIAGNOSIS — Z955 Presence of coronary angioplasty implant and graft: Secondary | ICD-10-CM | POA: Diagnosis not present

## 2021-09-27 ENCOUNTER — Other Ambulatory Visit: Payer: Self-pay | Admitting: Family Medicine

## 2021-09-27 MED ORDER — HYDROCODONE-ACETAMINOPHEN 10-325 MG PO TABS: 1.0000 | ORAL_TABLET | Freq: Three times a day (TID) | ORAL | 0 refills | Status: DC | PRN

## 2021-09-27 NOTE — Telephone Encounter (Signed)
Requesting: hydrocodone 10-325mg   Contract: 05/22/2021 UDS: 05/22/2021 Last Visit: 05/22/2021 Next Visit: 11/22/2021 Last Refill: 08/28/2021 #60 and 0RF  Please Advise

## 2021-10-28 ENCOUNTER — Other Ambulatory Visit: Payer: Self-pay | Admitting: Family Medicine

## 2021-10-29 ENCOUNTER — Other Ambulatory Visit: Payer: Self-pay | Admitting: Family Medicine

## 2021-10-29 MED ORDER — HYDROCODONE-ACETAMINOPHEN 10-325 MG PO TABS: 1.0000 | ORAL_TABLET | Freq: Three times a day (TID) | ORAL | 0 refills | Status: DC | PRN

## 2021-10-29 NOTE — Telephone Encounter (Signed)
Requesting: hydrocodone 10-325mg  Contract: 05/22/2021 UDS: 05/22/2021 Last Visit: 05/22/2021 Next Visit: 11/22/2021 Last Refill: 09/27/2021 #60 and 0RF  Please Advise

## 2021-11-22 ENCOUNTER — Ambulatory Visit (INDEPENDENT_AMBULATORY_CARE_PROVIDER_SITE_OTHER): Payer: No Typology Code available for payment source | Admitting: Family Medicine

## 2021-11-22 ENCOUNTER — Encounter: Payer: Self-pay | Admitting: Family Medicine

## 2021-11-22 VITALS — BP 124/62 | HR 64 | Temp 97.9°F | Resp 16 | Ht 69.0 in | Wt 201.0 lb

## 2021-11-22 DIAGNOSIS — M542 Cervicalgia: Secondary | ICD-10-CM

## 2021-11-22 DIAGNOSIS — M25561 Pain in right knee: Secondary | ICD-10-CM | POA: Diagnosis not present

## 2021-11-22 DIAGNOSIS — E291 Testicular hypofunction: Secondary | ICD-10-CM | POA: Diagnosis not present

## 2021-11-22 DIAGNOSIS — I10 Essential (primary) hypertension: Secondary | ICD-10-CM

## 2021-11-22 DIAGNOSIS — M170 Bilateral primary osteoarthritis of knee: Secondary | ICD-10-CM | POA: Diagnosis not present

## 2021-11-22 DIAGNOSIS — M546 Pain in thoracic spine: Secondary | ICD-10-CM

## 2021-11-22 DIAGNOSIS — R252 Cramp and spasm: Secondary | ICD-10-CM | POA: Diagnosis not present

## 2021-11-22 DIAGNOSIS — I25119 Atherosclerotic heart disease of native coronary artery with unspecified angina pectoris: Secondary | ICD-10-CM

## 2021-11-22 DIAGNOSIS — M25562 Pain in left knee: Secondary | ICD-10-CM | POA: Diagnosis not present

## 2021-11-22 DIAGNOSIS — E782 Mixed hyperlipidemia: Secondary | ICD-10-CM

## 2021-11-22 DIAGNOSIS — Z1211 Encounter for screening for malignant neoplasm of colon: Secondary | ICD-10-CM

## 2021-11-22 DIAGNOSIS — R739 Hyperglycemia, unspecified: Secondary | ICD-10-CM | POA: Diagnosis not present

## 2021-11-22 DIAGNOSIS — G8929 Other chronic pain: Secondary | ICD-10-CM

## 2021-11-22 MED ORDER — MELOXICAM 7.5 MG PO TABS: 7.5000 mg | ORAL_TABLET | Freq: Every day | ORAL | 3 refills | Status: DC

## 2021-11-22 NOTE — Assessment & Plan Note (Signed)
Well controlled, no changes to meds. Encouraged heart healthy diet such as the DASH diet and exercise as tolerated.  °

## 2021-11-22 NOTE — Patient Instructions (Signed)
Magnesium glycinate and L-tryptophan every night Melatonin 3-5 mg on nights when struggling with sleep   Quality Sleep Information, Adult Quality sleep is important for your mental and physical health. It also improves your quality of life. Quality sleep means you: Are asleep for most of the time you are in bed. Fall asleep within 30 minutes. Wake up no more than once a night.  Are awake for no longer than 20 minutes if you do wake up during the night. Most adults need 7-8 hours of quality sleep each night. How can poor sleep affect me? If you do not get enough quality sleep, you may have: Mood swings. Daytime sleepiness. Confusion. Decreased reaction time. Sleep disorders, such as insomnia and sleep apnea. Difficulty with: Solving problems. Coping with stress. Paying attention. These issues may affect your performance and productivity at work, school, and at home. Lack of sleep may also put you at higher risk for accidents, suicide, and risky behaviors. If you do not get quality sleep you may also be at higher risk for several health problems, including: Infections. Type 2 diabetes. Heart disease. High blood pressure. Obesity. Worsening of long-term conditions, like arthritis, kidney disease, depression, Parkinson's disease, and epilepsy. What actions can I take to get more quality sleep?   Stick to a sleep schedule. Go to sleep and wake up at about the same time each day. Do not try to sleep less on weekdays and make up for lost sleep on weekends. This does not work. Try to get about 30 minutes of exercise on most days. Do not exercise 2-3 hours before going to bed. Limit naps during the day to 30 minutes or less. Do not use any products that contain nicotine or tobacco, such as cigarettes or e-cigarettes. If you need help quitting, ask your health care provider. Do not drink caffeinated beverages for at least 8 hours before going to bed. Coffee, tea, and some sodas contain  caffeine. Do not drink alcohol close to bedtime. Do not eat large meals close to bedtime. Do not take naps in the late afternoon. Try to get at least 30 minutes of sunlight every day. Morning sunlight is best. Make time to relax before bed. Reading, listening to music, or taking a hot bath promotes quality sleep. Make your bedroom a place that promotes quality sleep. Keep your bedroom dark, quiet, and at a comfortable room temperature. Make sure your bed is comfortable. Take out sleep distractions like TV, a computer, smartphone, and bright lights. If you are lying awake in bed for longer than 20 minutes, get up and do a relaxing activity until you feel sleepy. Work with your health care provider to treat medical conditions that may affect sleeping, such as: Nasal obstruction. Snoring. Sleep apnea and other sleep disorders. Talk to your health care provider if you think any of your prescription medicines may cause you to have difficulty falling or staying asleep. If you have sleep problems, talk with a sleep consultant. If you think you have a sleep disorder, talk with your health care provider about getting evaluated by a specialist. Where to find more information [place] website: https://sleepfoundation.org National Heart, Lung, and [place] ([place]): http://www.saunders.info/.pdf Centers for Disease Control and Prevention (CDC): LearningDermatology.pl Contact a health care provider if you: Have trouble getting to sleep or staying asleep. Often wake up very early in the morning and cannot get back to sleep. Have daytime sleepiness. Have daytime sleep attacks of suddenly falling asleep and sudden muscle weakness (  narcolepsy). Have a tingling sensation in your legs with a strong urge to move your legs (restless legs syndrome). Stop breathing briefly during sleep (sleep apnea). Think you have a sleep disorder or are taking a medicine  that is affecting your quality of sleep. Summary Most adults need 7-8 hours of quality sleep each night. Getting enough quality sleep is an important part of health and well-being. Make your bedroom a place that promotes quality sleep and avoid things that may cause you to have poor sleep, such as alcohol, caffeine, smoking, and large meals. Talk to your health care provider if you have trouble falling asleep or staying asleep. This information is not intended to replace advice given to you by your health care provider. Make sure you discuss any questions you have with your health care provider. Document Revised: 02/25/2018 Document Reviewed: 02/25/2018 Elsevier Patient Education  [place].

## 2021-11-22 NOTE — Assessment & Plan Note (Addendum)
Struggles with daily pain in both knees. Check xrays and refer to ortho continue current meds.

## 2021-11-22 NOTE — Progress Notes (Signed)
Patient ID: Elijah Marquez, male    DOB: 02-01-1971  Age: 50 y.o. MRN: [id number]    Subjective:   Chief Complaint  Patient presents with   7 months follow up   Subjective   HPI Elijah Marquez presents for office visit today for follow up on HTN and osteoarthritis. He is doing well and has no febrile illnesses or recent ER visits to report. However, he did a stress test recently due to symptoms of dizziness, nausea, and off balance but thankfully results were normal. Denies CP/palp/SOB/HA/congestion/fevers/GI or GU c/o. Taking meds as prescribed.  Sleep issues: His baseline sleep is 4 hours daily. With current life stressors he has to take xanax to be able to sleep. He quit smoking about 7 years ago and soft drinks for 3 years now  Osteoarthritis: Still has c/o stiffness in knees bilaterally and states that he cannot run due to pain. Spasms of back of leg affect him as well. He has been dealing with pain in knees bilaterally for 8-10 years now.  FMHx: He reports that his mother and sisters have rheumatoid   Review of Systems  Constitutional:  Negative for chills, fatigue and fever.  HENT:  Negative for congestion, rhinorrhea, sinus pressure, sinus pain and sore throat.   Eyes:  Negative for pain.  Respiratory:  Negative for cough and shortness of breath.   Cardiovascular:  Negative for chest pain, palpitations and leg swelling.  Gastrointestinal:  Negative for abdominal pain, blood in stool, diarrhea, nausea and vomiting.  Genitourinary:  Negative for flank pain, frequency and penile pain.  Musculoskeletal:  Positive for arthralgias. Negative for back pain.  Neurological:  Negative for headaches.   History Past Medical History:  Diagnosis Date   Anxiety 08/23/2012   Anxiety as acute reaction to exceptional stress 09/20/2012   Chicken pox as a child   Chronic lower back pain    Fatigue    History of tobacco abuse    Failed Chantix and wellbutrin    Hyperlipidemia 08/23/2012    Hypogonadism in male 10/02/2014   Intermittent claudication ([place]) 08/13/2015   B/l legs   Neck pain on left side 08/23/2012   Osteoarthritis    "all over" (07/02/2017)   Osteoarthritis of both knees    Tobacco abuse     He has a past surgical history that includes Wisdom tooth extraction; Knee arthroscopy (Bilateral, 2011-2012); Anterior cervical decomp/discectomy fusion (12/08/2012); Back surgery; Multiple tooth extractions; Coronary angioplasty with stent (07/02/2017); LEFT HEART CATH AND CORONARY ANGIOGRAPHY (N/A, 07/02/2017); and CORONARY STENT INTERVENTION (N/A, 07/02/2017).   His family history includes Alcohol abuse in his father; Coronary artery disease in his mother; Emphysema in his sister; Fibromyalgia in his mother and sister; Heart attack in his mother; Heart disease in his mother and sister; Hypertension in his mother and sister; Kidney disease in his daughter; Lupus in his sister; Other in his son; Peripheral vascular disease (age of onset: 39) in his mother; Seizures in his son.He reports that he quit smoking about 6 years ago. His smoking use included cigarettes. He has a 52.50 pack-year smoking history. He has never used smokeless tobacco. He reports that he does not drink alcohol and does not use drugs.  Current Outpatient Medications on File Prior to Visit  Medication Sig Dispense Refill   ALPRAZolam (XANAX) 1 MG tablet TAKE 1 TABLET (1 MG TOTAL) BY MOUTH 2 (TWO) TIMES DAILY AS NEEDED FOR SLEEP OR ANXIETY. 60 tablet 1   HYDROcodone-acetaminophen (NORCO) 10-325 MG tablet Take  1 tablet by mouth every 8 (eight) hours as needed for moderate pain or severe pain. 60 tablet 0   metoprolol tartrate (LOPRESSOR) 25 MG tablet TAKE 1 TABLET BY MOUTH TWICE A DAY 180 tablet 1   nitroGLYCERIN (NITROSTAT) 0.4 MG SL tablet Place 1 tablet (0.4 mg total) under the tongue every 5 (five) minutes as needed for chest pain. To ER if no resolution 25 tablet 3   rosuvastatin (CRESTOR) 40 MG tablet TAKE 1 TABLET  BY MOUTH EVERY DAY 90 tablet 1   Testosterone 20.25 MG/ACT (1.62%) GEL PLACE 3 PUMP ONTO THE SKIN DAILY. 150 g 3   No current facility-administered medications on file prior to visit.     Objective:  Objective  Physical Exam Constitutional:      General: He is not in acute distress.    Appearance: Normal appearance. He is not ill-appearing or toxic-appearing.  HENT:     Head: Normocephalic and atraumatic.     Right Ear: Tympanic membrane, ear canal and external ear normal.     Left Ear: Tympanic membrane, ear canal and external ear normal.     Nose: No congestion or rhinorrhea.  Eyes:     Extraocular Movements: Extraocular movements intact.     Pupils: Pupils are equal, round, and reactive to light.  Cardiovascular:     Rate and Rhythm: Normal rate and regular rhythm.     Pulses: Normal pulses.     Heart sounds: Normal heart sounds. No murmur heard. Pulmonary:     Effort: Pulmonary effort is normal. No respiratory distress.     Breath sounds: Normal breath sounds. No wheezing, rhonchi or rales.  Abdominal:     General: Bowel sounds are normal.     Palpations: Abdomen is soft. There is no mass.     Tenderness: There is no abdominal tenderness. There is no guarding.     Hernia: No hernia is present.  Musculoskeletal:        General: Normal range of motion.     Cervical back: Normal range of motion and neck supple.  Skin:    General: Skin is warm and dry.  Neurological:     Mental Status: He is alert and oriented to person, place, and time.  Psychiatric:        Behavior: Behavior normal.   BP 124/62    Pulse 64    Temp 97.9 F (36.6 C)    Resp 16    Ht 5\' 9"  (1.753 m)    Wt 201 lb (91.2 kg)    SpO2 93%    BMI 29.68 kg/m  Wt Readings from Last 3 Encounters:  11/22/21 201 lb (91.2 kg)  05/22/21 186 lb 9.6 oz (84.6 kg)  09/26/20 181 lb 9.6 oz (82.4 kg)     Lab Results  Component Value Date   WBC 8.9 05/22/2021   HGB 15.2 05/22/2021   HCT 44.3 05/22/2021   PLT 204.0  05/22/2021   GLUCOSE 87 05/22/2021   CHOL 125 05/22/2021   TRIG 151.0 (H) 05/22/2021   HDL 44.20 05/22/2021   LDLDIRECT 47.0 08/19/2019   LDLCALC 51 05/22/2021   ALT 29 05/22/2021   AST 22 05/22/2021   NA 138 05/22/2021   K 4.0 05/22/2021   CL 100 05/22/2021   CREATININE 0.81 05/22/2021   BUN 12 05/22/2021   CO2 29 05/22/2021   TSH 2.10 05/22/2021   PSA 0.34 05/22/2021   INR 0.97 07/02/2017   HGBA1C 6.0 05/22/2021  MICROALBUR 0.7 08/25/2018    VAS [place] ABI WITH/WO TBI  Result Date: 09/16/2019 LOWER EXTREMITY DOPPLER STUDY Indications: Claudication. High Risk Factors: Hypertension, hyperlipidemia, coronary artery disease.  Performing Technologist: Cardell Peach RDCS, RVT  Examination Guidelines: A complete evaluation includes at minimum, Doppler waveform signals and systolic blood pressure reading at the level of bilateral brachial, anterior tibial, and posterior tibial arteries, when vessel segments are accessible. Bilateral testing is considered an integral part of a complete examination. Photoelectric Plethysmograph (PPG) waveforms and toe systolic pressure readings are included as required and additional duplex testing as needed. Limited examinations for reoccurring indications may be performed as noted.  ABI Findings: +---------+------------------+-----+---------+--------+  Right     Rt Pressure (mmHg) Index Waveform  Comment   +---------+------------------+-----+---------+--------+  Brachial  125                      triphasic           +---------+------------------+-----+---------+--------+  ATA       138                1.10  triphasic           +---------+------------------+-----+---------+--------+  PTA       134                1.07  triphasic           +---------+------------------+-----+---------+--------+  Great Toe 86                 0.69  Normal              +---------+------------------+-----+---------+--------+ +---------+------------------+-----+---------+-------+  Left       Lt Pressure (mmHg) Index Waveform  Comment  +---------+------------------+-----+---------+-------+  Brachial  121                      triphasic          +---------+------------------+-----+---------+-------+  ATA       118                0.94  triphasic          +---------+------------------+-----+---------+-------+  PTA       134                1.07  triphasic          +---------+------------------+-----+---------+-------+  Great Toe 92                 0.74  Normal             +---------+------------------+-----+---------+-------+  Summary: Right: Resting right ankle-brachial index is within normal range. No evidence of significant right lower extremity arterial disease. The right toe-brachial index is abnormal. Left: Resting left ankle-brachial index is within normal range. No evidence of significant left lower extremity arterial disease. The left toe-brachial index is normal.  *See table(s) above for measurements and observations.  Electronically signed by Jenne Campus MD on 09/16/2019 at 10:16:17 AM.   Final      Assessment & Plan:  Plan    Meds ordered this encounter  Medications   meloxicam (MOBIC) 7.5 MG tablet    Sig: Take 1-2 tablets (7.5-15 mg total) by mouth daily.    Dispense:  60 tablet    Refill:  3    Problem List Items Addressed This Visit     Osteoarthritis of both knees    Struggles with daily pain in both knees. Check xrays and refer  to ortho continue current meds.       Relevant Medications   meloxicam (MOBIC) 7.5 MG tablet   Other Relevant Orders   Rheumatoid Factor   Antinuclear Antib (ANA)   Sedimentation rate   DG Knee 3 Views Right   DG Knee 3 Views Left   Neck pain on left side    S/p injury. Notes some episodes of light headedness and a h/o CAD will check carotid dopplers to investigate      Hyperlipidemia - Primary    Encourage heart healthy diet such as MIND or DASH diet, increase exercise, avoid trans fats, simple carbohydrates and processed  foods, consider a krill or fish or flaxseed oil cap daily. Tolerating Rosuvastatin      Relevant Orders   Lipid panel   Back pain   Relevant Medications   meloxicam (MOBIC) 7.5 MG tablet   Hypogonadism in male    Supplement and monitor      Relevant Orders   Testosterone   Essential hypertension    Well controlled, no changes to meds. Encouraged heart healthy diet such as the DASH diet and exercise as tolerated.       Relevant Orders   CBC   Comprehensive metabolic panel   TSH   Hyperglycemia    hgba1c acceptable, minimize simple carbs. Increase exercise as tolerated.       Relevant Orders   Hemoglobin A1c   Other Visit Diagnoses     Muscle cramps       Relevant Orders   Magnesium   Pain in both knees, unspecified chronicity       Relevant Orders   Rheumatoid Factor   Antinuclear Antib (ANA)   Sedimentation rate   DG Knee 3 Views Right   DG Knee 3 Views Left   Colon cancer screening       Relevant Orders   Ambulatory referral to Gastroenterology       Follow-up: Return in about 6 months (around 05/23/2022) for f/u visit.  I, Suezanne Jacquet, acting as a scribe for Penni Homans, MD, have documented all relevent documentation on behalf of Penni Homans, MD, as directed by Penni Homans, MD while in the presence of Penni Homans, MD. DO:11/22/21.  I, [name], MD personally performed the services described in this documentation. All medical record entries made by the scribe were at my direction and in my presence. I have reviewed the chart and agree that the record reflects my personal performance and is accurate and complete

## 2021-11-22 NOTE — Assessment & Plan Note (Addendum)
Encourage heart healthy diet such as MIND or DASH diet, increase exercise, avoid trans fats, simple carbohydrates and processed foods, consider a krill or fish or flaxseed oil cap daily.  Tolerating Rosuvastatin 

## 2021-11-22 NOTE — Assessment & Plan Note (Signed)
hgba1c acceptable, minimize simple carbs. Increase exercise as tolerated.  

## 2021-11-22 NOTE — Assessment & Plan Note (Signed)
S/p injury. Notes some episodes of light headedness and a h/o CAD will check carotid dopplers to investigate

## 2021-11-22 NOTE — Assessment & Plan Note (Signed)
Supplement and monitor 

## 2021-11-23 LAB — COMPREHENSIVE METABOLIC PANEL
ALT: 32 U/L (ref 0–53)
AST: 26 U/L (ref 0–37)
Albumin: 4.7 g/dL (ref 3.5–5.2)
Alkaline Phosphatase: 69 U/L (ref 39–117)
BUN: 12 mg/dL (ref 6–23)
CO2: 34 mEq/L — ABNORMAL HIGH (ref 19–32)
Calcium: 9.7 mg/dL (ref 8.4–10.5)
Chloride: 99 mEq/L (ref 96–112)
Creatinine, Ser: 0.95 mg/dL (ref 0.40–1.50)
GFR: 92 mL/min (ref >60.00)
Glucose, Bld: 76 mg/dL (ref 70–99)
Potassium: 4.1 mEq/L (ref 3.5–5.1)
Sodium: 140 mEq/L (ref 135–145)
Total Bilirubin: 0.5 mg/dL (ref 0.2–1.2)
Total Protein: 7.3 g/dL (ref 6.0–8.3)

## 2021-11-23 LAB — TESTOSTERONE: Testosterone: 237.14 ng/dL — ABNORMAL LOW (ref 300.00–890.00)

## 2021-11-23 LAB — CBC
HCT: 42.2 % (ref 39.0–52.0)
Hemoglobin: 14.5 g/dL (ref 13.0–17.0)
MCHC: 34.3 g/dL (ref 30.0–36.0)
MCV: 86.4 fl (ref 78.0–100.0)
Platelets: 195 10*3/uL (ref 150.0–400.0)
RBC: 4.89 Mil/uL (ref 4.22–5.81)
RDW: 12.5 % (ref 11.5–15.5)
WBC: 6.4 10*3/uL (ref 4.0–10.5)

## 2021-11-23 LAB — LDL CHOLESTEROL, DIRECT: Direct LDL: 63 mg/dL

## 2021-11-23 LAB — LIPID PANEL
Cholesterol: 118 mg/dL (ref 0–200)
HDL: 38.7 mg/dL — ABNORMAL LOW (ref >39.00)
NonHDL: 79.43
Total CHOL/HDL Ratio: 3
Triglycerides: 313 mg/dL — ABNORMAL HIGH (ref 0.0–149.0)
VLDL: 62.6 mg/dL — ABNORMAL HIGH (ref 0.0–40.0)

## 2021-11-23 LAB — SEDIMENTATION RATE: Sed Rate: 3 mm/hr (ref 0–20)

## 2021-11-23 LAB — HEMOGLOBIN A1C: Hgb A1c MFr Bld: 6.1 % (ref 4.6–6.5)

## 2021-11-23 LAB — MAGNESIUM: Magnesium: 1.9 mg/dL (ref 1.5–2.5)

## 2021-11-23 LAB — TSH: TSH: 2.77 u[IU]/mL (ref 0.35–5.50)

## 2021-11-24 LAB — ANA: Anti Nuclear Antibody (ANA): NEGATIVE

## 2021-11-24 LAB — RHEUMATOID FACTOR: Rheumatoid fact SerPl-aCnc: <14 IU/mL (ref <14)

## 2021-11-28 ENCOUNTER — Other Ambulatory Visit (HOSPITAL_BASED_OUTPATIENT_CLINIC_OR_DEPARTMENT_OTHER): Payer: Self-pay

## 2021-11-28 DIAGNOSIS — I2510 Atherosclerotic heart disease of native coronary artery without angina pectoris: Secondary | ICD-10-CM | POA: Diagnosis not present

## 2021-11-28 DIAGNOSIS — M542 Cervicalgia: Secondary | ICD-10-CM | POA: Diagnosis not present

## 2021-11-28 DIAGNOSIS — Z955 Presence of coronary angioplasty implant and graft: Secondary | ICD-10-CM | POA: Diagnosis not present

## 2021-11-28 DIAGNOSIS — Z880 Allergy status to penicillin: Secondary | ICD-10-CM | POA: Diagnosis not present

## 2021-11-28 DIAGNOSIS — Z79899 Other long term (current) drug therapy: Secondary | ICD-10-CM | POA: Diagnosis not present

## 2021-11-28 DIAGNOSIS — S161XXA Strain of muscle, fascia and tendon at neck level, initial encounter: Secondary | ICD-10-CM | POA: Diagnosis not present

## 2021-11-28 DIAGNOSIS — Z888 Allergy status to other drugs, medicaments and biological substances status: Secondary | ICD-10-CM | POA: Diagnosis not present

## 2021-11-28 DIAGNOSIS — Y92410 Unspecified street and highway as the place of occurrence of the external cause: Secondary | ICD-10-CM | POA: Diagnosis not present

## 2021-11-28 DIAGNOSIS — Z791 Long term (current) use of non-steroidal anti-inflammatories (NSAID): Secondary | ICD-10-CM | POA: Diagnosis not present

## 2021-11-28 DIAGNOSIS — M545 Low back pain, unspecified: Secondary | ICD-10-CM | POA: Diagnosis not present

## 2021-11-28 MED ORDER — METHYLPREDNISOLONE 4 MG PO TBPK
ORAL_TABLET | ORAL | 0 refills | Status: DC
  Filled 2021-11-28: qty 21, 6d supply, fill #0

## 2021-11-28 MED ORDER — METHOCARBAMOL 500 MG PO TABS
ORAL_TABLET | ORAL | 0 refills | Status: DC
  Filled 2021-11-28: qty 20, 5d supply, fill #0

## 2021-11-30 ENCOUNTER — Telehealth: Payer: Self-pay | Admitting: Family Medicine

## 2021-11-30 MED ORDER — HYDROCODONE-ACETAMINOPHEN 10-325 MG PO TABS: 1.0000 | ORAL_TABLET | Freq: Three times a day (TID) | ORAL | 0 refills | Status: DC | PRN

## 2021-11-30 NOTE — Telephone Encounter (Signed)
[facility]  Requesting: hydrocodone 10-325mg  Contract: 05/22/2021 UDS: 05/22/2021 Last Visit: 11/22/2021 Next Visit: 05/27/2022 Last Refill: 10/29/2021 #60 and 0RF  Please Advise

## 2021-11-30 NOTE — Telephone Encounter (Signed)
PDMP okay, PCP prescribes this medication regularly, Rx sent

## 2021-12-05 ENCOUNTER — Other Ambulatory Visit: Payer: Self-pay | Admitting: *Deleted

## 2021-12-05 MED ORDER — TESTOSTERONE 20.25 MG/ACT (1.62%) TD GEL
TRANSDERMAL | 3 refills | Status: DC
Start: 2021-12-05 — End: 2022-02-20

## 2021-12-05 NOTE — Telephone Encounter (Signed)
-----   Message from [name], MD sent at 11/27/2021  1:14 PM EST ----- Labs stable no new concerns, no changes except testosterone is a little lower can increase testosterone to 4 pumps a day. If he agrees. Send me the new prescription ot sign.

## 2021-12-05 NOTE — Telephone Encounter (Signed)
Patient agree to increase testosterone to 4 pumps a day. Rx pended.

## 2021-12-07 ENCOUNTER — Other Ambulatory Visit (HOSPITAL_BASED_OUTPATIENT_CLINIC_OR_DEPARTMENT_OTHER): Payer: Self-pay

## 2021-12-07 ENCOUNTER — Other Ambulatory Visit: Payer: Self-pay

## 2021-12-07 ENCOUNTER — Telehealth: Payer: Self-pay | Admitting: Family Medicine

## 2021-12-07 ENCOUNTER — Ambulatory Visit (HOSPITAL_BASED_OUTPATIENT_CLINIC_OR_DEPARTMENT_OTHER)
Admission: RE | Admit: 2021-12-07 | Discharge: 2021-12-07 | Disposition: A | Discharge status: Home / Self Care | Payer: BC Managed Care – PPO | Source: Ambulatory Visit | Attending: Family Medicine | Admitting: Family Medicine

## 2021-12-07 ENCOUNTER — Ambulatory Visit (INDEPENDENT_AMBULATORY_CARE_PROVIDER_SITE_OTHER): Payer: BC Managed Care – PPO | Admitting: Family Medicine

## 2021-12-07 ENCOUNTER — Other Ambulatory Visit: Payer: Self-pay | Admitting: Family Medicine

## 2021-12-07 ENCOUNTER — Encounter: Payer: Self-pay | Admitting: Family Medicine

## 2021-12-07 ENCOUNTER — Encounter (HOSPITAL_BASED_OUTPATIENT_CLINIC_OR_DEPARTMENT_OTHER): Payer: Self-pay

## 2021-12-07 VITALS — BP 128/72 | HR 65 | Temp 98.0°F | Resp 16 | Ht 69.0 in | Wt 201.8 lb

## 2021-12-07 DIAGNOSIS — M170 Bilateral primary osteoarthritis of knee: Secondary | ICD-10-CM

## 2021-12-07 DIAGNOSIS — M542 Cervicalgia: Secondary | ICD-10-CM

## 2021-12-07 DIAGNOSIS — R739 Hyperglycemia, unspecified: Secondary | ICD-10-CM

## 2021-12-07 DIAGNOSIS — M25562 Pain in left knee: Secondary | ICD-10-CM

## 2021-12-07 DIAGNOSIS — M25561 Pain in right knee: Secondary | ICD-10-CM

## 2021-12-07 DIAGNOSIS — I10 Essential (primary) hypertension: Secondary | ICD-10-CM

## 2021-12-07 DIAGNOSIS — M1712 Unilateral primary osteoarthritis, left knee: Secondary | ICD-10-CM | POA: Diagnosis not present

## 2021-12-07 DIAGNOSIS — M7989 Other specified soft tissue disorders: Secondary | ICD-10-CM | POA: Diagnosis not present

## 2021-12-07 MED ORDER — GABAPENTIN 100 MG PO CAPS: 100.0000 mg | ORAL_CAPSULE | Freq: Three times a day (TID) | ORAL | 3 refills | Status: DC

## 2021-12-07 MED ORDER — METHYLPREDNISOLONE 4 MG PO TABS: ORAL_TABLET | ORAL | 0 refills | Status: DC

## 2021-12-07 MED ORDER — TIZANIDINE HCL 2 MG PO TABS: 1.0000 mg | ORAL_TABLET | Freq: Three times a day (TID) | ORAL | 1 refills | Status: DC | PRN

## 2021-12-07 NOTE — Patient Instructions (Signed)
Cervical Radiculopathy Cervical radiculopathy happens when a nerve in the neck (a cervical nerve) is pinched or bruised. This condition can happen because of an injury to the cervical spine (vertebrae) in the neck, or as part of the normal aging process. Pressure on the cervical nerves can cause pain or numbness that travels from the neck all the way down to the arm and fingers. This condition usually gets better with rest. Treatment may be needed if the condition does not improve. What are the causes? This condition may be caused by: A neck injury. A bulging (herniated) disk. Muscle spasms. Muscle tightness in the neck due to overuse. Arthritis. Breakdown or degeneration in the bones and joints of the spine (spondylosis) due to aging. Bone spurs that may develop near the cervical nerves. What are the signs or symptoms? Symptoms of this condition include: Pain. The pain may travel from the neck to the arm and hand. The pain can be severe or irritating. It may get worse when you move your neck. Numbness or tingling in your arm or hand. Weakness in the affected arm and hand, in severe cases. How is this diagnosed? This condition may be diagnosed based on your symptoms, your medical history, and a physical exam. You may also have tests, including: X-rays. CT scan. MRI. Electromyogram (EMG). Nerve conduction tests. How is this treated? In many cases, treatment is not needed for this condition. With rest, the condition usually gets better over time. If treatment is needed, options may include: Wearing a soft neck collar (cervical collar) for short periods of time. Doing physical therapy to strengthen your neck muscles. Taking medicines. These may include NSAIDs, such as ibuprofen, or oral corticosteroids. Having spinal injections, in severe cases. Having surgery. This may be needed if other treatments do not help. Different types of surgery may be done depending on the cause of this  condition. Follow these instructions at home: If you have a cervical collar: Wear it as told by your health care provider. Remove it only as told by your health care provider. Ask your health care provider if you can remove the cervical collar for cleaning and bathing. If you are allowed to remove the collar for cleaning or bathing: Follow instructions from your health care provider about how to remove the collar safely. Clean the collar by wiping it with mild soap and water and drying it completely. Take out any removable pads in the collar every 1-2 days, and wash them by hand with soap and water. Let them air-dry completely before you put them back in the collar. Check your skin under the collar for irritation or sores. If you see any, tell your health care provider. Managing pain   Take over-the-counter and prescription medicines only as told by your health care provider. If directed, put ice on the affected area. To do this: If you have a soft neck collar, remove it as told by your health care provider. Put ice in a plastic bag. Place a towel between your skin and the bag. Leave the ice on for 20 minutes, 2-3 times a day. Remove the ice if your skin turns bright red. This is very important. If you cannot feel pain, heat, or cold, you have a greater risk of damage to the area. If applying ice does not help, you can try using heat. Use the heat source that your health care provider recommends, such as a moist heat pack or a heating pad. Place a towel between your skin and   the heat source. Leave the heat on for 20-30 minutes. Remove the heat if your skin turns bright red. This is especially important if you are unable to feel pain, heat, or cold. You have a greater risk of getting burned. Try a gentle neck and shoulder massage to help relieve symptoms. Activity Rest as needed. Return to your normal activities as told by your health care provider. Ask your health care provider what  activities are safe for you. Do stretching and strengthening exercises as told by your health care provider or your physical therapist. You may have to avoid lifting. Ask your health care provider how much you can safely lift. General instructions Use a flat pillow when you sleep. Do not drive while wearing a cervical collar. If you do not have a cervical collar, ask your health care provider if it is safe to drive while your neck heals. Ask your health care provider if the medicine prescribed to you requires you to avoid driving or using machinery. Do not use any products that contain nicotine or tobacco. These products include cigarettes, chewing tobacco, and vaping devices, such as e-cigarettes. If you need help quitting, ask your health care provider. Keep all follow-up visits. This is important. Contact a health care provider if: Your condition does not improve with treatment. Get help right away if: Your pain gets much worse and is not controlled with medicines. You have weakness or numbness in your hand, arm, face, or leg. You have a high fever. You have a stiff, rigid neck. You lose control of your bowels or your bladder (have incontinence). You have trouble with walking, balance, or speaking. Summary Cervical radiculopathy happens when a nerve in the neck is pinched or bruised. A nerve can get pinched from a bulging disk, arthritis, muscle spasms, or an injury to the neck. Symptoms include pain, tingling, or numbness radiating from the neck to the arm or hand. Weakness can also occur in severe cases. Treatment may include rest, wearing a cervical collar, and physical therapy. Medicines may be prescribed to help with pain. In severe cases, injections or surgery may be needed. This information is not intended to replace advice given to you by your health care provider. Make sure you discuss any questions you have with your health care provider. Document Revised: 05/24/2021 Document  Reviewed: 05/24/2021 Elsevier Patient Education  2022 [employer]  

## 2021-12-09 ENCOUNTER — Other Ambulatory Visit: Payer: Self-pay | Admitting: Family Medicine

## 2021-12-09 DIAGNOSIS — M542 Cervicalgia: Secondary | ICD-10-CM

## 2021-12-09 NOTE — Assessment & Plan Note (Signed)
Well controlled, no changes to meds. Encouraged heart healthy diet such as the DASH diet and exercise as tolerated.  °
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
## 2021-12-09 NOTE — Assessment & Plan Note (Signed)
hgba1c acceptable, minimize simple carbs. Increase exercise as tolerated.  

## 2021-12-09 NOTE — Assessment & Plan Note (Signed)
Repeat xrays show moderate arthritis bilaterally. Consider referral to ortho for further evalaution

## 2021-12-09 NOTE — Telephone Encounter (Signed)
Opened in error
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
## 2021-12-10 ENCOUNTER — Telehealth: Payer: Self-pay | Admitting: Family Medicine

## 2021-12-10 ENCOUNTER — Telehealth: Payer: Self-pay

## 2021-12-10 ENCOUNTER — Other Ambulatory Visit: Payer: Self-pay | Admitting: Family Medicine

## 2021-12-10 DIAGNOSIS — M542 Cervicalgia: Secondary | ICD-10-CM

## 2021-12-10 NOTE — Telephone Encounter (Signed)
[  8:40 AM] Elijah Marquez so I can't get it approved can you see if she wants me to send in clinicals which may require a peer to peer
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
## 2021-12-11 ENCOUNTER — Ambulatory Visit (HOSPITAL_BASED_OUTPATIENT_CLINIC_OR_DEPARTMENT_OTHER): Payer: BC Managed Care – PPO

## 2021-12-13 ENCOUNTER — Other Ambulatory Visit: Payer: Self-pay

## 2021-12-13 ENCOUNTER — Ambulatory Visit (HOSPITAL_BASED_OUTPATIENT_CLINIC_OR_DEPARTMENT_OTHER)
Admission: RE | Admit: 2021-12-13 | Discharge: 2021-12-13 | Disposition: A | Discharge status: Home / Self Care | Payer: BC Managed Care – PPO | Source: Ambulatory Visit | Attending: Family Medicine | Admitting: Family Medicine

## 2021-12-13 DIAGNOSIS — R202 Paresthesia of skin: Secondary | ICD-10-CM | POA: Diagnosis not present

## 2021-12-13 DIAGNOSIS — M542 Cervicalgia: Secondary | ICD-10-CM | POA: Insufficient documentation

## 2021-12-17 ENCOUNTER — Other Ambulatory Visit: Payer: Self-pay | Admitting: Family Medicine

## 2021-12-17 DIAGNOSIS — I25119 Atherosclerotic heart disease of native coronary artery with unspecified angina pectoris: Secondary | ICD-10-CM

## 2021-12-17 DIAGNOSIS — F419 Anxiety disorder, unspecified: Secondary | ICD-10-CM

## 2021-12-17 NOTE — Telephone Encounter (Signed)
Requesting: alprazolam 1mg   Contract: 05/22/2021 UDS: 05/22/2021 Last Visit: 12/07/2021 Next Visit: 05/27/2022 Last Refill: 08/15/2021 #60 and 1RF  Please Advise

## 2021-12-17 NOTE — Telephone Encounter (Signed)
Done

## 2021-12-19 ENCOUNTER — Encounter: Payer: Self-pay | Admitting: Family Medicine

## 2021-12-25 ENCOUNTER — Ambulatory Visit (HOSPITAL_BASED_OUTPATIENT_CLINIC_OR_DEPARTMENT_OTHER): Admission: RE | Admit: 2021-12-25 | Payer: BC Managed Care – PPO | Source: Ambulatory Visit

## 2021-12-31 ENCOUNTER — Other Ambulatory Visit: Payer: Self-pay | Admitting: Internal Medicine

## 2022-01-01 MED ORDER — HYDROCODONE-ACETAMINOPHEN 10-325 MG PO TABS: 1.0000 | ORAL_TABLET | Freq: Three times a day (TID) | ORAL | 0 refills | Status: DC | PRN

## 2022-01-01 NOTE — Telephone Encounter (Signed)
Requesting: hydrocodone 10-325mg   Contract: 05/22/2021 UDS: 05/22/2021 Last Visit: 12/07/2021 Next Visit: 05/27/2022 Last Refill: 11/30/2021 #60 and 0RF  Please Advise

## 2022-01-05 ENCOUNTER — Telehealth (HOSPITAL_BASED_OUTPATIENT_CLINIC_OR_DEPARTMENT_OTHER): Payer: Self-pay

## 2022-01-30 ENCOUNTER — Other Ambulatory Visit: Payer: Self-pay | Admitting: Family Medicine

## 2022-01-30 MED ORDER — HYDROCODONE-ACETAMINOPHEN 10-325 MG PO TABS: 1.0000 | ORAL_TABLET | Freq: Three times a day (TID) | ORAL | 0 refills | Status: DC | PRN

## 2022-01-30 NOTE — Telephone Encounter (Signed)
Requesting: Norco 10-325 MG ?Contract: 05/22/21 ?UDS: 05/22/21 ?Last Visit: 12/07/21 ?Next Visit: 05/27/22 ?Last Refill: 01/01/22, #60, 0 refills ? ?Please Advise  ?

## 2022-02-18 ENCOUNTER — Other Ambulatory Visit: Payer: Self-pay | Admitting: Family Medicine

## 2022-02-20 ENCOUNTER — Other Ambulatory Visit: Payer: Self-pay

## 2022-02-20 DIAGNOSIS — F32A Depression, unspecified: Secondary | ICD-10-CM

## 2022-02-20 DIAGNOSIS — R7989 Other specified abnormal findings of blood chemistry: Secondary | ICD-10-CM

## 2022-02-20 MED ORDER — TESTOSTERONE 20.25 MG/ACT (1.62%) TD GEL: TRANSDERMAL | 3 refills | Status: DC

## 2022-02-20 MED ORDER — ALPRAZOLAM 1 MG PO TABS: 1.0000 mg | ORAL_TABLET | Freq: Two times a day (BID) | ORAL | 1 refills | Status: DC | PRN

## 2022-02-20 NOTE — Telephone Encounter (Signed)
Pt would like these sent  ?

## 2022-03-06 ENCOUNTER — Other Ambulatory Visit: Payer: Self-pay | Admitting: Family Medicine

## 2022-03-06 MED ORDER — HYDROCODONE-ACETAMINOPHEN 10-325 MG PO TABS: 1.0000 | ORAL_TABLET | Freq: Three times a day (TID) | ORAL | 0 refills | Status: DC | PRN

## 2022-03-06 NOTE — Telephone Encounter (Signed)
Requesting: hydrocodone  ?Contract: 05/22/21 ?UDS: 05/22/21 ?Last Visit: 12/07/21 ?Next Visit: 05/27/22 ?Last Refill: 01/30/22 ? ?WILL UPDATE UDS/CONTRACT AT June VISIT ? ?Please Advise ? ?

## 2022-03-19 DIAGNOSIS — S8392XA Sprain of unspecified site of left knee, initial encounter: Secondary | ICD-10-CM | POA: Diagnosis not present

## 2022-03-19 DIAGNOSIS — X501XXA Overexertion from prolonged static or awkward postures, initial encounter: Secondary | ICD-10-CM | POA: Diagnosis not present

## 2022-04-04 ENCOUNTER — Other Ambulatory Visit: Payer: Self-pay | Admitting: Family Medicine

## 2022-04-04 MED ORDER — HYDROCODONE-ACETAMINOPHEN 10-325 MG PO TABS: 1.0000 | ORAL_TABLET | Freq: Three times a day (TID) | ORAL | 0 refills | Status: DC | PRN

## 2022-04-04 NOTE — Telephone Encounter (Signed)
Requesting: hydrocodone ?Contract: 05/22/21 ?UDS: 05/22/21 ?Last Visit: 12/07/21 ?Next Visit: none ?Last Refill: 03/06/22 ? ?Please Advise ? ?

## 2022-05-05 ENCOUNTER — Other Ambulatory Visit: Payer: Self-pay | Admitting: Family Medicine

## 2022-05-06 MED ORDER — HYDROCODONE-ACETAMINOPHEN 10-325 MG PO TABS: 1.0000 | ORAL_TABLET | Freq: Three times a day (TID) | ORAL | 0 refills | Status: DC | PRN

## 2022-05-06 NOTE — Telephone Encounter (Signed)
Requesting: hydrocodone Contract: 05/22/21 UDS: 05/22/21 Last Visit: 12/07/21 Next Visit: none Last Refill: 04/04/22  Please Advise

## 2022-05-27 ENCOUNTER — Ambulatory Visit: Payer: No Typology Code available for payment source | Admitting: Family Medicine

## 2022-06-06 ENCOUNTER — Other Ambulatory Visit: Payer: Self-pay | Admitting: Family Medicine

## 2022-06-07 MED ORDER — HYDROCODONE-ACETAMINOPHEN 10-325 MG PO TABS: 1.0000 | ORAL_TABLET | Freq: Three times a day (TID) | ORAL | 0 refills | Status: DC | PRN

## 2022-06-07 NOTE — Telephone Encounter (Signed)
Requesting: hydrocodone 10-'325mg'$   Contract: 05/22/21 UDS: 05/22/21 Last Visit: 12/07/21 Next Visit: None Last Refill: 05/06/22 #60 and 0RF  Please Advise
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
## 2022-06-24 ENCOUNTER — Other Ambulatory Visit: Payer: Self-pay | Admitting: Family Medicine

## 2022-06-24 DIAGNOSIS — F32A Depression, unspecified: Secondary | ICD-10-CM

## 2022-06-24 MED ORDER — ALPRAZOLAM 1 MG PO TABS: 1.0000 mg | ORAL_TABLET | Freq: Two times a day (BID) | ORAL | 1 refills | Status: DC | PRN

## 2022-06-24 NOTE — Telephone Encounter (Signed)
Requesting: xanax '1mg'$  Contract: 05/22/21 UDS: 05/22/21 Last Visit: 12/07/21 Next Visit: not scheduled Last Refill:02/20/22  Please Advise

## 2022-06-24 NOTE — Telephone Encounter (Signed)
Medication: ALPRAZolam (XANAX) 1 MG tablet  Has the patient contacted their pharmacy? No.  Preferred Pharmacy:   [id number] [place] [place] [id number] Phone:  [phone] Fax:  [phone]

## 2022-06-25 DIAGNOSIS — I25119 Atherosclerotic heart disease of native coronary artery with unspecified angina pectoris: Secondary | ICD-10-CM | POA: Diagnosis not present

## 2022-06-25 DIAGNOSIS — E782 Mixed hyperlipidemia: Secondary | ICD-10-CM | POA: Diagnosis not present

## 2022-06-25 DIAGNOSIS — Z955 Presence of coronary angioplasty implant and graft: Secondary | ICD-10-CM | POA: Diagnosis not present

## 2022-06-25 DIAGNOSIS — I10 Essential (primary) hypertension: Secondary | ICD-10-CM | POA: Diagnosis not present

## 2022-07-04 ENCOUNTER — Other Ambulatory Visit: Payer: Self-pay | Admitting: Family Medicine

## 2022-07-04 MED ORDER — HYDROCODONE-ACETAMINOPHEN 10-325 MG PO TABS: 1.0000 | ORAL_TABLET | Freq: Three times a day (TID) | ORAL | 0 refills | Status: DC | PRN

## 2022-07-04 NOTE — Telephone Encounter (Signed)
Requesting: hydrocodone 10-'325mg'$  Contract:05/22/21 UDS:05/22/21 Last Visit: 12/07/21 Next Visit: None Last Refill: 06/07/22 #60 and 0RF  Please Advise

## 2022-07-10 ENCOUNTER — Other Ambulatory Visit: Payer: Self-pay | Admitting: Family Medicine

## 2022-07-10 DIAGNOSIS — R7989 Other specified abnormal findings of blood chemistry: Secondary | ICD-10-CM

## 2022-07-10 NOTE — Telephone Encounter (Signed)
Requesting: testosterone gel  Contract:05/22/21 UDS: 05/22/21 Last Visit:12/07/21 Next Visit: None Last Refill:02/20/22 #150g AND 3RF  Please Advise

## 2022-07-11 ENCOUNTER — Telehealth: Payer: Self-pay

## 2022-07-11 NOTE — Telephone Encounter (Signed)
PA approved. Effective 07/11/2022 to 07/11/2023.

## 2022-07-11 NOTE — Telephone Encounter (Signed)
Called pt and LVM of approved PA

## 2022-07-11 NOTE — Telephone Encounter (Signed)
PA initiated via Covermymeds; KEY: [id number]. Awaiting determination.

## 2022-07-31 ENCOUNTER — Other Ambulatory Visit: Payer: Self-pay | Admitting: Family Medicine

## 2022-08-01 MED ORDER — HYDROCODONE-ACETAMINOPHEN 10-325 MG PO TABS: 1.0000 | ORAL_TABLET | Freq: Three times a day (TID) | ORAL | 0 refills | Status: DC | PRN

## 2022-08-01 NOTE — Telephone Encounter (Signed)
Requesting: hydrocodone 10-'325mg'$   Contract: 05/22/21 UDS: 05/22/21 Last Visit: 12/07/21 Next Visit: None Last Refill: 07/04/22 #60 and 0RF  Please Advise

## 2022-09-01 ENCOUNTER — Other Ambulatory Visit: Payer: Self-pay | Admitting: Family Medicine

## 2022-09-02 MED ORDER — HYDROCODONE-ACETAMINOPHEN 10-325 MG PO TABS: 1.0000 | ORAL_TABLET | Freq: Three times a day (TID) | ORAL | 0 refills | Status: DC | PRN

## 2022-09-02 NOTE — Telephone Encounter (Signed)
Requesting: hydrocodone Contract:  05/22/21 UDS: 05/22/21 Last Visit: 12/07/21 Next Visit: left voicemail for patient to call back to schedule as soon as possible. Last Refill: 08/01/22  Please Advise

## 2022-09-06 ENCOUNTER — Encounter: Payer: Self-pay | Admitting: Family

## 2022-09-06 ENCOUNTER — Ambulatory Visit (INDEPENDENT_AMBULATORY_CARE_PROVIDER_SITE_OTHER): Payer: BC Managed Care – PPO | Admitting: Family

## 2022-09-06 VITALS — BP 148/90 | HR 81 | Temp 97.5°F | Resp 18 | Ht 69.0 in | Wt 202.6 lb

## 2022-09-06 DIAGNOSIS — I25119 Atherosclerotic heart disease of native coronary artery with unspecified angina pectoris: Secondary | ICD-10-CM

## 2022-09-06 DIAGNOSIS — Z79899 Other long term (current) drug therapy: Secondary | ICD-10-CM | POA: Diagnosis not present

## 2022-09-06 DIAGNOSIS — E291 Testicular hypofunction: Secondary | ICD-10-CM | POA: Diagnosis not present

## 2022-09-06 DIAGNOSIS — I10 Essential (primary) hypertension: Secondary | ICD-10-CM | POA: Diagnosis not present

## 2022-09-06 DIAGNOSIS — G8929 Other chronic pain: Secondary | ICD-10-CM

## 2022-09-06 DIAGNOSIS — Z1211 Encounter for screening for malignant neoplasm of colon: Secondary | ICD-10-CM

## 2022-09-06 DIAGNOSIS — E782 Mixed hyperlipidemia: Secondary | ICD-10-CM

## 2022-09-06 DIAGNOSIS — R739 Hyperglycemia, unspecified: Secondary | ICD-10-CM | POA: Diagnosis not present

## 2022-09-06 DIAGNOSIS — M546 Pain in thoracic spine: Secondary | ICD-10-CM

## 2022-09-06 DIAGNOSIS — F419 Anxiety disorder, unspecified: Secondary | ICD-10-CM

## 2022-09-06 MED ORDER — ASPIRIN 81 MG PO TBEC: 81.0000 mg | DELAYED_RELEASE_TABLET | Freq: Every day | ORAL | 12 refills | Status: AC

## 2022-09-06 MED ORDER — METOPROLOL SUCCINATE ER 25 MG PO TB24: 75.0000 mg | ORAL_TABLET | Freq: Every day | ORAL | 1 refills | Status: DC

## 2022-09-06 NOTE — Assessment & Plan Note (Signed)
States that Dr. Harriet Masson discontinued his plavix. He takes aspirin '81mg'$  once daily.

## 2022-09-06 NOTE — Assessment & Plan Note (Signed)
Lab Results  Component Value Date   CHOL 118 11/22/2021   HDL 38.70 (L) 11/22/2021   LDLCALC 51 05/22/2021   LDLDIRECT 63.0 11/22/2021   TRIG 313.0 (H) 11/22/2021   CHOLHDL 3 11/22/2021   Continues crestor, obtain follow up lipid panel.

## 2022-09-06 NOTE — Assessment & Plan Note (Signed)
Lab Results  Component Value Date   HGBA1C 6.1 11/22/2021  repeat A1C.

## 2022-09-06 NOTE — Assessment & Plan Note (Addendum)
BP Readings from Last 3 Encounters:  09/06/22 (!) 148/90  12/07/21 128/72  11/22/21 124/62   BP above goal. On metoprolol '25mg'$  bid. Will change to Toprol XL '75mg'$  once daily.

## 2022-09-06 NOTE — Progress Notes (Signed)
Subjective:   By signing my name below, I, Cassell Clement, attest that this documentation has been prepared under the direction and in the presence of Birdie Sons, NP 09/06/2022    Patient ID: Elijah Marquez, male    DOB: 12/21/1970, 51 y.o.   MRN: [id number]  Chief Complaint  Patient presents with   Medication Refill    HPI Patient is in today for an office visit  Medications: He is still currently taking 25 mg Toprol-xl and 40 mg Crestor Exercise: He is currently exercising regularly Diet: Patient is eating fairly well Colonoscopy: He has not had a colonoscopy done. Immunization: He is not interested in receiving an influenza vaccine this visit. Blood pressure: Blood pressure is high this visit.  BP Readings from Last 3 Encounters:  09/06/22 (!) 148/90  12/07/21 128/72  11/22/21 124/62   Pulse Readings from Last 3 Encounters:  09/06/22 81  12/07/21 65  11/22/21 64     Health Maintenance Due  Topic Date Due   Zoster Vaccines- Shingrix (1 of 2) Never done   COLONOSCOPY (Pts 45-64yrs Insurance coverage will need to be confirmed)  Never done    Past Medical History:  Diagnosis Date   Anxiety 08/23/2012   Anxiety as acute reaction to exceptional stress 09/20/2012   Chicken pox as a child   Chronic lower back pain    Fatigue    History of tobacco abuse    Failed Chantix and wellbutrin    Hyperlipidemia 08/23/2012   Hypogonadism in male 10/02/2014   Intermittent claudication (HCC) 08/13/2015   B/l legs   Neck pain on left side 08/23/2012   Osteoarthritis    "all over" (07/02/2017)   Osteoarthritis of both knees    Tobacco abuse     Past Surgical History:  Procedure Laterality Date   ANTERIOR CERVICAL DECOMP/DISCECTOMY FUSION  12/08/2012   C4,5,,6   BACK SURGERY     CORONARY ANGIOPLASTY WITH STENT PLACEMENT  07/02/2017   "1 stent"   CORONARY STENT INTERVENTION N/A 07/02/2017   Procedure: CORONARY STENT INTERVENTION;  Surgeon: Lennette Bihari, MD;   Location: [facility];  Service: Cardiovascular;  Laterality: N/A;  LAD/DIAG   KNEE ARTHROSCOPY Bilateral 2011-2012   left-right   LEFT HEART CATH AND CORONARY ANGIOGRAPHY N/A 07/02/2017   Procedure: Left Heart Cath and Coronary Angiography;  Surgeon: Lennette Bihari, MD;  Location: [facility];  Service: Cardiovascular;  Laterality: N/A;   MULTIPLE TOOTH EXTRACTIONS     WISDOM TOOTH EXTRACTION      Family History  Problem Relation Age of Onset   Fibromyalgia Mother    Coronary artery disease Mother    Hypertension Mother    Heart attack Mother    Heart disease Mother        CAD   Peripheral vascular disease Mother 69       hemorrhage from vascular procedure   Fibromyalgia Sister    Hypertension Sister    Heart disease Sister    Other Son        mildly mentally handicap   Seizures Son    Lupus Sister    Emphysema Sister        smoker   Kidney disease Daughter    Alcohol abuse Father     Social History   Socioeconomic History   Marital status: Married    Spouse name: Not on file   Number of children: Not on file   Years of education: Not  on file   Highest education level: Not on file  Occupational History   Not on file  Tobacco Use   Smoking status: Former    Packs/day: 1.50    Years: 35.00    Total pack years: 52.50    Types: Cigarettes    Quit date: 07/31/2015    Years since quitting: 7.1   Smokeless tobacco: Never  Vaping Use   Vaping Use: Some days   Last attempt to quit: 06/16/2017  Substance and Sexual Activity   Alcohol use: No   Drug use: No   Sexual activity: Not Currently    Partners: Female  Other Topics Concern   Not on file  Social History Narrative   Not on file   Social Determinants of Health   Financial Resource Strain: Not on file  Food Insecurity: Not on file  Transportation Needs: Not on file  Physical Activity: Not on file  Stress: Not on file  Social Connections: Not on file  Intimate Partner Violence: Not on file     Outpatient Medications Prior to Visit  Medication Sig Dispense Refill   ALPRAZolam (XANAX) 1 MG tablet Take 1 tablet (1 mg total) by mouth 2 (two) times daily as needed for sleep or anxiety. 60 tablet 1   HYDROcodone-acetaminophen (NORCO) 10-325 MG tablet Take 1 tablet by mouth every 8 (eight) hours as needed for moderate pain or severe pain. 60 tablet 0   nitroGLYCERIN (NITROSTAT) 0.4 MG SL tablet Place 1 tablet (0.4 mg total) under the tongue every 5 (five) minutes as needed for chest pain. To ER if no resolution 25 tablet 3   rosuvastatin (CRESTOR) 40 MG tablet TAKE 1 TABLET BY MOUTH EVERY DAY 90 tablet 1   Testosterone 20.25 MG/ACT (1.62%) GEL PLACE 4 PUMP ONTO THE SKIN DAILY. 150 g 3   metoprolol tartrate (LOPRESSOR) 25 MG tablet TAKE 1 TABLET BY MOUTH TWICE A DAY 180 tablet 1   clopidogrel (PLAVIX) 75 MG tablet TAKE 1 TABLET BY MOUTH EVERY DAY (Patient not taking: Reported on 09/06/2022) 90 tablet 1   gabapentin (NEURONTIN) 100 MG capsule Take 1 capsule (100 mg total) by mouth 3 (three) times daily. (Patient not taking: Reported on 09/06/2022) 90 capsule 3   meloxicam (MOBIC) 7.5 MG tablet Take 1-2 tablets (7.5-15 mg total) by mouth daily. (Patient not taking: Reported on 09/06/2022) 60 tablet 3   methylPREDNISolone (MEDROL) 4 MG tablet 5 tabs po x 3 day then 4 tabs po x 3 day then 3 tabs po x 3 day then 2 tabs po x 3 day then 1 tab po x 3 day and stop (Patient not taking: Reported on 09/06/2022) 45 tablet 0   tiZANidine (ZANAFLEX) 2 MG tablet Take 0.5-2 tablets (1-4 mg total) by mouth every 8 (eight) hours as needed for muscle spasms. (Patient not taking: Reported on 09/06/2022) 60 tablet 1   No facility-administered medications prior to visit.    Allergies  Allergen Reactions   Atorvastatin Other (See Comments)    Caused leg pain   Penicillins Swelling and Rash    Has patient had a PCN reaction causing immediate rash, facial/tongue/throat swelling, SOB or lightheadedness with  hypotension: Yes Has patient had a PCN reaction causing severe rash involving mucus membranes or skin necrosis: No Has patient had a PCN reaction that required hospitalization: Yes Has patient had a PCN reaction occurring within the last 10 years: No If all of the above answers are "NO", then may proceed with Cephalosporin use.  ROS    See HPI Objective:    Physical Exam Constitutional:      General: He is not in acute distress.    Appearance: Normal appearance. He is not ill-appearing.  HENT:     Head: Normocephalic and atraumatic.     Right Ear: External ear normal.     Left Ear: External ear normal.  Eyes:     Extraocular Movements: Extraocular movements intact.     Pupils: Pupils are equal, round, and reactive to light.  Cardiovascular:     Rate and Rhythm: Normal rate and regular rhythm.     Heart sounds: Normal heart sounds. No murmur heard.    No gallop.  Pulmonary:     Effort: Pulmonary effort is normal. No respiratory distress.     Breath sounds: Normal breath sounds. No wheezing or rales.  Skin:    General: Skin is warm and dry.  Neurological:     Mental Status: He is alert and oriented to person, place, and time.  Psychiatric:        Mood and Affect: Mood normal.        Behavior: Behavior normal.        Judgment: Judgment normal.     BP (!) 148/90 (BP Location: Left Arm, Patient Position: Sitting, Cuff Size: Normal)   Pulse 81   Temp (!) 97.5 F (36.4 C) (Oral)   Resp 18   Ht $R'5\' 9"'Cg$  (1.753 m)   Wt 202 lb 9.6 oz (91.9 kg)   SpO2 95%   BMI 29.92 kg/m  Wt Readings from Last 3 Encounters:  09/06/22 202 lb 9.6 oz (91.9 kg)  12/07/21 201 lb 12.8 oz (91.5 kg)  11/22/21 201 lb (91.2 kg)       Assessment & Plan:   Problem List Items Addressed This Visit       Unprioritized   Hypogonadism in male    Maintained on testosterone therapy. Check Testosterone level. Controlled substance contract is updated and UDS has been ordered.       Relevant  Orders   Testosterone (Completed)   Hyperlipidemia    Lab Results  Component Value Date   CHOL 118 11/22/2021   HDL 38.70 (L) 11/22/2021   LDLCALC 51 05/22/2021   LDLDIRECT 63.0 11/22/2021   TRIG 313.0 (H) 11/22/2021   CHOLHDL 3 11/22/2021  Continues crestor, obtain follow up lipid panel.       Relevant Medications   metoprolol succinate (TOPROL-XL) 25 MG 24 hr tablet   aspirin EC 81 MG tablet   Other Relevant Orders   Lipid panel (Completed)   Comp Met (CMET) (Completed)   Hyperglycemia    Lab Results  Component Value Date   HGBA1C 6.1 11/22/2021  repeat A1C.         Relevant Orders   Hemoglobin A1c (Completed)   Lipid panel (Completed)   Essential hypertension    BP Readings from Last 3 Encounters:  09/06/22 (!) 148/90  12/07/21 128/72  11/22/21 124/62  BP above goal. On metoprolol $RemoveBefor'25mg'nlBPKqQkmwky$  bid. Will change to Toprol XL $RemoveB'75mg'TFdRHJKx$  once daily.       Relevant Medications   metoprolol succinate (TOPROL-XL) 25 MG 24 hr tablet   aspirin EC 81 MG tablet   Other Relevant Orders   Lipid panel (Completed)   Coronary artery disease involving native coronary artery of native heart with angina pectoris ([place])    States that Dr. Harriet Masson discontinued his plavix. He takes aspirin $RemoveBefor'81mg'PVWGylwnKmlM$  once daily.  Relevant Medications   metoprolol succinate (TOPROL-XL) 25 MG 24 hr tablet   aspirin EC 81 MG tablet   Chronic back pain    Reports that hydrocodone does continue to provide him some relief from his chronic back pain. Continue same, controlled substance contract is updated.      Relevant Medications   aspirin EC 81 MG tablet   Other Visit Diagnoses     Colon cancer screening    -  Primary   Relevant Orders   Ambulatory referral to Gastroenterology   High risk medication use       Relevant Orders   DRUG MONITORING, PANEL 8 WITH CONFIRMATION, URINE (Completed)       Meds ordered this encounter  Medications   metoprolol succinate (TOPROL-XL) 25 MG 24 hr tablet    Sig: Take 3  tablets (75 mg total) by mouth daily.    Dispense:  90 tablet    Refill:  1    Order Specific Question:   Supervising Provider    Answer:   Penni Homans A [[id number]]   aspirin EC 81 MG tablet    Sig: Take 1 tablet (81 mg total) by mouth daily. Swallow whole.    Dispense:  30 tablet    Refill:  12    Order Specific Question:   Supervising Provider    Answer:   Penni Homans A [[id number]]    I, Nance Pear, NP, personally preformed the services described in this documentation.  All medical record entries made by the scribe were at my direction and in my presence.  I have reviewed the chart and discharge instructions (if applicable) and agree that the record reflects my personal performance and is accurate and complete. 09/06/2022   I,Amber Collins,acting as a scribe for Nance Pear, NP.,have documented all relevant documentation on the behalf of Nance Pear, NP,as directed by  Nance Pear, NP while in the presence of Nance Pear, NP.      Nance Pear, NP

## 2022-09-06 NOTE — Patient Instructions (Signed)
Please change metoprolol to Toprol xl '25mg'$ , take 3 tabs once daily.

## 2022-09-07 LAB — LIPID PANEL
Cholesterol: 112 mg/dL (ref <200)
HDL: 38 mg/dL — ABNORMAL LOW (ref >=40)
LDL Cholesterol (Calc): 47 mg/dL (calc)
Non-HDL Cholesterol (Calc): 74 mg/dL (calc) (ref <130)
Total CHOL/HDL Ratio: 2.9 (calc) (ref <5.0)
Triglycerides: 194 mg/dL — ABNORMAL HIGH (ref <150)

## 2022-09-07 LAB — COMPREHENSIVE METABOLIC PANEL
AG Ratio: 1.8 (calc) (ref 1.0–2.5)
ALT: 31 U/L (ref 9–46)
AST: 25 U/L (ref 10–35)
Albumin: 4.8 g/dL (ref 3.6–5.1)
Alkaline phosphatase (APISO): 81 U/L (ref 35–144)
BUN: 14 mg/dL (ref 7–25)
CO2: 27 mmol/L (ref 20–32)
Calcium: 10 mg/dL (ref 8.6–10.3)
Chloride: 103 mmol/L (ref 98–110)
Creat: 1.01 mg/dL (ref 0.70–1.30)
Globulin: 2.6 g/dL (calc) (ref 1.9–3.7)
Glucose, Bld: 97 mg/dL (ref 65–99)
Potassium: 3.8 mmol/L (ref 3.5–5.3)
Sodium: 140 mmol/L (ref 135–146)
Total Bilirubin: 0.5 mg/dL (ref 0.2–1.2)
Total Protein: 7.4 g/dL (ref 6.1–8.1)

## 2022-09-07 LAB — HEMOGLOBIN A1C
Hgb A1c MFr Bld: 6 % of total Hgb — ABNORMAL HIGH (ref <5.7)
Mean Plasma Glucose: 126 mg/dL
eAG (mmol/L): 7 mmol/L

## 2022-09-07 LAB — TESTOSTERONE: Testosterone: 94 ng/dL — ABNORMAL LOW (ref 250–827)

## 2022-09-08 NOTE — Assessment & Plan Note (Signed)
Reports that hydrocodone does continue to provide him some relief from his chronic back pain. Continue same, controlled substance contract is updated.

## 2022-09-08 NOTE — Assessment & Plan Note (Signed)
Maintained on testosterone therapy. Check Testosterone level. Controlled substance contract is updated and UDS has been ordered.

## 2022-09-08 NOTE — Assessment & Plan Note (Signed)
Stable with prn use of alprazolam. Continue same.

## 2022-09-09 ENCOUNTER — Telehealth: Payer: Self-pay | Admitting: Family

## 2022-09-09 NOTE — Telephone Encounter (Signed)
See mychart.  

## 2022-09-13 ENCOUNTER — Encounter: Payer: Self-pay | Admitting: Gastroenterology

## 2022-09-20 ENCOUNTER — Ambulatory Visit (INDEPENDENT_AMBULATORY_CARE_PROVIDER_SITE_OTHER): Payer: BC Managed Care – PPO | Admitting: Family

## 2022-09-20 DIAGNOSIS — E782 Mixed hyperlipidemia: Secondary | ICD-10-CM | POA: Diagnosis not present

## 2022-09-20 DIAGNOSIS — E291 Testicular hypofunction: Secondary | ICD-10-CM

## 2022-09-20 DIAGNOSIS — I10 Essential (primary) hypertension: Secondary | ICD-10-CM

## 2022-09-20 DIAGNOSIS — R7989 Other specified abnormal findings of blood chemistry: Secondary | ICD-10-CM

## 2022-09-20 MED ORDER — TESTOSTERONE 20.25 MG/ACT (1.62%) TD GEL: 3.0000 | Freq: Every day | TRANSDERMAL | 3 refills | Status: DC

## 2022-09-20 NOTE — Assessment & Plan Note (Signed)
BP Readings from Last 3 Encounters:  09/20/22 118/77  09/06/22 (!) 148/90  12/07/21 128/72   BP is much better on increased dose of metoprolol. Continue same.

## 2022-09-20 NOTE — Progress Notes (Signed)
Subjective:   By signing my name below, I, Carylon Perches, attest that this documentation has been prepared under the direction and in the presence of Karie Chimera, NP 09/20/2022   Patient ID: Elijah Marquez, male    DOB: Apr 17, 1971, 51 y.o.   MRN: [id number]  No chief complaint on file.   HPI Patient is in today for an office visit  Testosterone: His testosterone levels are low. He was advised to do 4 pumps of Testosterone 20.25 MG/ACT but has been doing 2 pumps. He states that he occasionally forgets to pump and therefore occasionally goes a day with no pumps.   Blood Pressure: His blood pressure levels are improving. He is currently taking 3 tablets of 25 Mg of Metoprolol Succinate daily. He is not regularly checking his blood pressure at home BP Readings from Last 3 Encounters:  09/20/22 118/77  09/06/22 (!) 148/90  12/07/21 128/72   Pulse Readings from Last 3 Encounters:  09/20/22 64  09/06/22 81  12/07/21 65   Cholesterol: His triglyceride levels are improving. They were previously 313 mg/dL and have now decreased to 194 mg/dL  Lab Results  Component Value Date   CHOL 112 09/06/2022   HDL 38 (L) 09/06/2022   LDLCALC 47 09/06/2022   LDLDIRECT 63.0 11/22/2021   TRIG 194 (H) 09/06/2022   CHOLHDL 2.9 09/06/2022   Colonoscopy: He reports that he has scheduled his colonoscopy for 10/07/2022  Health Maintenance Due  Topic Date Due   Zoster Vaccines- Shingrix (1 of 2) Never done   COLONOSCOPY (Pts 45-44yr Insurance coverage will need to be confirmed)  Never done    Past Medical History:  Diagnosis Date   Anxiety 08/23/2012   Anxiety as acute reaction to exceptional stress 09/20/2012   Chicken pox as a child   Chronic lower back pain    Fatigue    History of tobacco abuse    Failed Chantix and wellbutrin    Hyperlipidemia 08/23/2012   Hypogonadism in male 10/02/2014   Intermittent claudication ([facility] 08/13/2015   B/l legs   Neck pain on left side 08/23/2012    Osteoarthritis    "all over" (07/02/2017)   Osteoarthritis of both knees    Tobacco abuse     Past Surgical History:  Procedure Laterality Date   ANTERIOR CERVICAL DECOMP/DISCECTOMY FUSION  12/08/2012   C4,5,,6   BACK SURGERY     CORONARY ANGIOPLASTY WITH STENT PLACEMENT  07/02/2017   "1 stent"   CORONARY STENT INTERVENTION N/A 07/02/2017   Procedure: CORONARY STENT INTERVENTION;  Surgeon: KTroy Sine MD;  [facility];  Service: Cardiovascular;  Laterality: N/A;  LAD/DIAG   KNEE ARTHROSCOPY Bilateral 2011-2012   left-right   LEFT HEART CATH AND CORONARY ANGIOGRAPHY N/A 07/02/2017   Procedure: Left Heart Cath and Coronary Angiography;  Surgeon: KTroy Sine MD;  [facility];  Service: Cardiovascular;  Laterality: N/A;   MULTIPLE TOOTH EXTRACTIONS     WISDOM TOOTH EXTRACTION      Family History  Problem Relation Age of Onset   Fibromyalgia Mother    Coronary artery disease Mother    Hypertension Mother    Heart attack Mother    Heart disease Mother        CAD   Peripheral vascular disease Mother 660      hemorrhage from vascular procedure   Fibromyalgia Sister    Hypertension Sister    Heart disease Sister    Other  Son        mildly mentally handicap   Seizures Son    Lupus Sister    Emphysema Sister        smoker   Kidney disease Daughter    Alcohol abuse Father     Social History   Socioeconomic History   Marital status: Married    Spouse name: Not on file   Number of children: Not on file   Years of education: Not on file   Highest education level: Not on file  Occupational History   Not on file  Tobacco Use   Smoking status: Former    Packs/day: 1.50    Years: 35.00    Total pack years: 52.50    Types: Cigarettes    Quit date: 07/31/2015    Years since quitting: 7.1   Smokeless tobacco: Never  Vaping Use   Vaping Use: Some days   Last attempt to quit: 06/16/2017  Substance and Sexual Activity   Alcohol use:  No   Drug use: No   Sexual activity: Not Currently    Partners: Female  Other Topics Concern   Not on file  Social History Narrative   Not on file   Social Determinants of Health   Financial Resource Strain: Not on file  Food Insecurity: Not on file  Transportation Needs: Not on file  Physical Activity: Not on file  Stress: Not on file  Social Connections: Not on file  Intimate Partner Violence: Not on file    Outpatient Medications Prior to Visit  Medication Sig Dispense Refill   ALPRAZolam (XANAX) 1 MG tablet Take 1 tablet (1 mg total) by mouth 2 (two) times daily as needed for sleep or anxiety. 60 tablet 1   aspirin EC 81 MG tablet Take 1 tablet (81 mg total) by mouth daily. Swallow whole. 30 tablet 12   HYDROcodone-acetaminophen (NORCO) 10-325 MG tablet Take 1 tablet by mouth every 8 (eight) hours as needed for moderate pain or severe pain. 60 tablet 0   metoprolol succinate (TOPROL-XL) 25 MG 24 hr tablet Take 3 tablets (75 mg total) by mouth daily. 90 tablet 1   nitroGLYCERIN (NITROSTAT) 0.4 MG SL tablet Place 1 tablet (0.4 mg total) under the tongue every 5 (five) minutes as needed for chest pain. To ER if no resolution 25 tablet 3   rosuvastatin (CRESTOR) 40 MG tablet TAKE 1 TABLET BY MOUTH EVERY DAY 90 tablet 1   Testosterone 20.25 MG/ACT (1.62%) GEL PLACE 4 PUMP ONTO THE SKIN DAILY. 150 g 3   No facility-administered medications prior to visit.    Allergies  Allergen Reactions   Atorvastatin Other (See Comments)    Caused leg pain   Penicillins Swelling and Rash    Has patient had a PCN reaction causing immediate rash, facial/tongue/throat swelling, SOB or lightheadedness with hypotension: Yes Has patient had a PCN reaction causing severe rash involving mucus membranes or skin necrosis: No Has patient had a PCN reaction that required hospitalization: Yes Has patient had a PCN reaction occurring within the last 10 years: No If all of the above answers are "NO", then  may proceed with Cephalosporin use.     ROS     Objective:    Physical Exam Constitutional:      General: He is not in acute distress.    Appearance: Normal appearance. He is not ill-appearing.  HENT:     Head: Normocephalic and atraumatic.     Right Ear: External ear  normal.     Left Ear: External ear normal.  Eyes:     Extraocular Movements: Extraocular movements intact.     Pupils: Pupils are equal, round, and reactive to light.  Cardiovascular:     Rate and Rhythm: Normal rate and regular rhythm.     Heart sounds: Normal heart sounds. No murmur heard.    No gallop.  Pulmonary:     Effort: Pulmonary effort is normal. No respiratory distress.     Breath sounds: Normal breath sounds. No wheezing or rales.  Skin:    General: Skin is warm and dry.  Neurological:     Mental Status: He is alert and oriented to person, place, and time.  Psychiatric:        Mood and Affect: Mood normal.        Behavior: Behavior normal.        Judgment: Judgment normal.     There were no vitals taken for this visit. Wt Readings from Last 3 Encounters:  09/06/22 202 lb 9.6 oz (91.9 kg)  12/07/21 201 lb 12.8 oz (91.5 kg)  11/22/21 201 lb (91.2 kg)       Assessment & Plan:   Problem List Items Addressed This Visit   None  No orders of the defined types were placed in this encounter.   I, Carylon Perches, personally preformed the services described in this documentation.  All medical record entries made by the scribe were at my direction and in my presence.  I have reviewed the chart and discharge instructions (if applicable) and agree that the record reflects my personal performance and is accurate and complete. 09/20/2022   I,Amber Collins,acting as a scribe for Nance Pear, NP.,have documented all relevant documentation on the behalf of Nance Pear, NP,as directed by  Nance Pear, NP while in the presence of Nance Pear, NP.    [employer]

## 2022-09-22 NOTE — Assessment & Plan Note (Signed)
Testosterone was low. Med list says he is using 4 pumps/day but he states he has only been doing 2 pumps per day and has not been that consistent. Will plan to increase to 3 pumps/day and repeat testosterone level in 1 month.

## 2022-09-22 NOTE — Assessment & Plan Note (Signed)
Discussed dietary changes to lower triglycerides.

## 2022-09-26 ENCOUNTER — Ambulatory Visit (AMBULATORY_SURGERY_CENTER): Payer: Self-pay | Admitting: *Deleted

## 2022-09-26 ENCOUNTER — Other Ambulatory Visit: Payer: Self-pay | Admitting: Family Medicine

## 2022-09-26 ENCOUNTER — Telehealth: Payer: Self-pay | Admitting: Family Medicine

## 2022-09-26 VITALS — Ht 69.0 in | Wt 206.8 lb

## 2022-09-26 DIAGNOSIS — Z1211 Encounter for screening for malignant neoplasm of colon: Secondary | ICD-10-CM

## 2022-09-26 LAB — DRUG MONITORING, PANEL 8 WITH CONFIRMATION, URINE
6 Acetylmorphine: NEGATIVE ng/mL (ref <10)
Alcohol Metabolites: NEGATIVE ng/mL (ref <500)
Alphahydroxyalprazolam: 45 ng/mL — ABNORMAL HIGH (ref <25)
Alphahydroxymidazolam: NEGATIVE ng/mL (ref <50)
Alphahydroxytriazolam: NEGATIVE ng/mL (ref <50)
Aminoclonazepam: NEGATIVE ng/mL (ref <25)
Amphetamines: NEGATIVE ng/mL (ref <500)
Benzodiazepines: POSITIVE ng/mL — AB (ref <100)
Buprenorphine, Urine: NEGATIVE ng/mL (ref <5)
Cocaine Metabolite: NEGATIVE ng/mL (ref <150)
Codeine: NEGATIVE ng/mL (ref <50)
Creatinine: 22.7 mg/dL (ref >=20.0)
Hydrocodone: 263 ng/mL — ABNORMAL HIGH (ref <50)
Hydromorphone: NEGATIVE ng/mL (ref <50)
Hydroxyethylflurazepam: NEGATIVE ng/mL (ref <50)
Lorazepam: NEGATIVE ng/mL (ref <50)
MDMA: NEGATIVE ng/mL (ref <500)
Marijuana Metabolite: NEGATIVE ng/mL (ref <20)
Morphine: NEGATIVE ng/mL (ref <50)
Nordiazepam: NEGATIVE ng/mL (ref <50)
Norhydrocodone: 384 ng/mL — ABNORMAL HIGH (ref <50)
Opiates: POSITIVE ng/mL — AB (ref <100)
Oxazepam: NEGATIVE ng/mL (ref <50)
Oxidant: NEGATIVE ug/mL (ref <200)
Oxycodone: NEGATIVE ng/mL (ref <100)
Temazepam: NEGATIVE ng/mL (ref <50)
pH: 6.6 (ref 4.5–9.0)

## 2022-09-26 LAB — DM TEMPLATE

## 2022-09-26 MED ORDER — HYDROCODONE-ACETAMINOPHEN 10-325 MG PO TABS: 1.0000 | ORAL_TABLET | Freq: Three times a day (TID) | ORAL | 0 refills | Status: DC | PRN

## 2022-09-26 MED ORDER — NA SULFATE-K SULFATE-MG SULF 17.5-3.13-1.6 GM/177ML PO SOLN: 1.0000 | Freq: Once | ORAL | 0 refills | Status: AC

## 2022-09-26 NOTE — Progress Notes (Signed)

## 2022-09-26 NOTE — Telephone Encounter (Signed)
Medication:   ALPRAZolam (XANAX) 1 MG tablet [[id number]]   Testosterone 20.25 MG/ACT (1.62%) GEL [[id number]]   Has the patient contacted their pharmacy? No. (If no, request that the patient contact the pharmacy for the refill.) (If yes, when and what did the pharmacy advise?)  Preferred Pharmacy (with phone number or street name):   [id number] [address]Phone: [phone] Fax: [phone]  Agent: Please be advised that RX refills may take up to 3 business days. We ask that you follow-up with your pharmacy.

## 2022-09-27 ENCOUNTER — Encounter: Payer: Self-pay | Admitting: Gastroenterology

## 2022-09-28 ENCOUNTER — Other Ambulatory Visit: Payer: Self-pay | Admitting: Family

## 2022-09-28 ENCOUNTER — Other Ambulatory Visit: Payer: Self-pay | Admitting: Family Medicine

## 2022-10-03 ENCOUNTER — Telehealth: Payer: Self-pay | Admitting: Family Medicine

## 2022-10-03 ENCOUNTER — Other Ambulatory Visit: Payer: Self-pay | Admitting: Family Medicine

## 2022-10-03 DIAGNOSIS — F32A Depression, unspecified: Secondary | ICD-10-CM

## 2022-10-05 ENCOUNTER — Other Ambulatory Visit: Payer: Self-pay | Admitting: Family Medicine

## 2022-10-07 ENCOUNTER — Telehealth: Payer: Self-pay | Admitting: Family Medicine

## 2022-10-07 ENCOUNTER — Other Ambulatory Visit: Payer: Self-pay | Admitting: Family Medicine

## 2022-10-07 ENCOUNTER — Other Ambulatory Visit: Payer: Self-pay

## 2022-10-07 ENCOUNTER — Ambulatory Visit (AMBULATORY_SURGERY_CENTER): Payer: BC Managed Care – PPO | Admitting: Gastroenterology

## 2022-10-07 ENCOUNTER — Other Ambulatory Visit (HOSPITAL_BASED_OUTPATIENT_CLINIC_OR_DEPARTMENT_OTHER): Payer: Self-pay

## 2022-10-07 ENCOUNTER — Encounter: Payer: Self-pay | Admitting: Gastroenterology

## 2022-10-07 VITALS — BP 113/65 | HR 54 | Temp 98.6°F | Resp 14 | Ht 69.0 in | Wt 206.8 lb

## 2022-10-07 DIAGNOSIS — Z1211 Encounter for screening for malignant neoplasm of colon: Secondary | ICD-10-CM | POA: Diagnosis not present

## 2022-10-07 DIAGNOSIS — K635 Polyp of colon: Secondary | ICD-10-CM

## 2022-10-07 DIAGNOSIS — D120 Benign neoplasm of cecum: Secondary | ICD-10-CM

## 2022-10-07 DIAGNOSIS — D125 Benign neoplasm of sigmoid colon: Secondary | ICD-10-CM

## 2022-10-07 DIAGNOSIS — K640 First degree hemorrhoids: Secondary | ICD-10-CM

## 2022-10-07 MED ORDER — SODIUM CHLORIDE 0.9 % IV SOLN: 500.0000 mL | Freq: Once | INTRAVENOUS | Status: DC

## 2022-10-07 MED ORDER — HYDROCODONE-ACETAMINOPHEN 7.5-325 MG PO TABS
1.0000 | ORAL_TABLET | Freq: Three times a day (TID) | ORAL | 0 refills | Status: DC | PRN
  Filled 2022-10-07: qty 60, 20d supply, fill #0

## 2022-10-07 NOTE — Op Note (Signed)
[facility] Patient Name: Elijah Marquez Procedure Date: 10/07/2022 1:26 PM MRN: [id number] Endoscopist: Gerrit Heck , MD, [id number] Age: 51 Referring MD:  Date of Birth: [birth year] Gender: Male Account #: [id number] Procedure:                Colonoscopy Indications:              Screening for colorectal malignant neoplasm, This                            is the patient's first colonoscopy Medicines:                Monitored Anesthesia Care Procedure:                Pre-Anesthesia Assessment:                           - Prior to the procedure, a History and Physical                            was performed, and patient medications and                            allergies were reviewed. The patient's tolerance of                            previous anesthesia was also reviewed. The risks                            and benefits of the procedure and the sedation                            options and risks were discussed with the patient.                            All questions were answered, and informed consent                            was obtained. Prior Anticoagulants: The patient has                            taken no anticoagulant or antiplatelet agents. ASA                            Grade Assessment: II - A patient with mild systemic                            disease. After reviewing the risks and benefits,                            the patient was deemed in satisfactory condition to                            undergo the procedure.  After obtaining informed consent, the colonoscope                            was passed under direct vision. Throughout the                            procedure, the patient's blood pressure, pulse, and                            oxygen saturations were monitored continuously. The                            CF [id number] #[id number] was introduced through the anus                            and advanced to the  the cecum, identified by the                            appendiceal orifice, IC valve and                            transillumination. The colonoscopy was performed                            without difficulty. The patient tolerated the                            procedure well. The quality of the bowel                            preparation was good. The ileocecal valve,                            appendiceal orifice, and rectum were photographed. Scope In: 1:30:34 PM Scope Out: 1:52:24 PM Scope Withdrawal Time: 0 hours 20 minutes 15 seconds  Total Procedure Duration: 0 hours 21 minutes 50 seconds  Findings:                 The perianal and digital rectal examinations were                            normal.                           A 5 mm polyp was found adjacent to the appendiceal                            orifice. This did not invade into the AO. The polyp                            was sessile. The polyp was removed with a cold                            snare. Resection and retrieval were complete.  Estimated blood loss was minimal.                           Four sessile polyps were found in the distal                            sigmoid colon. The polyps were 1 to 3 mm in size.                            These polyps were removed with a cold snare.                            Resection and retrieval were complete. Estimated                            blood loss was minimal.                           Non-bleeding internal hemorrhoids were found during                            retroflexion. The hemorrhoids were small. Complications:            No immediate complications. Estimated Blood Loss:     Estimated blood loss was minimal. Impression:               - One 5 mm polyp at the appendiceal orifice,                            removed with a cold snare. Resected and retrieved.                           - Four 1 to 3 mm polyps in the distal sigmoid                             colon, removed with a cold snare. Resected and                            retrieved.                           - Non-bleeding internal hemorrhoids. Recommendation:           - Patient has a contact number available for                            emergencies. The signs and symptoms of potential                            delayed complications were discussed with the                            patient. Return to normal activities tomorrow.  Written discharge instructions were provided to the                            patient.                           - Resume previous diet.                           - Continue present medications.                           - Await pathology results.                           - Repeat colonoscopy for surveillance based on                            pathology results.                           - Return to GI clinic PRN. Gerrit Heck, MD 10/07/2022 1:57:22 PM

## 2022-10-07 NOTE — Telephone Encounter (Signed)
Prescription was cancel at [facility]

## 2022-10-07 NOTE — Patient Instructions (Signed)
Handouts on hemorrhoids and polyps given to patient. Await pathology results. Resume previous diet and continue present medications. Repeat colonoscopy for surveillance will be determined based off of pathology results. Return to GI office as needed.  YOU HAD AN ENDOSCOPIC PROCEDURE TODAY AT [facility]:   Refer to the procedure report that was given to you for any specific questions about what was found during the examination.  If the procedure report does not answer your questions, please call your gastroenterologist to clarify.  If you requested that your care partner not be given the details of your procedure findings, then the procedure report has been included in a sealed envelope for you to review at your convenience later.  YOU SHOULD EXPECT: Some feelings of bloating in the abdomen. Passage of more gas than usual.  Walking can help get rid of the air that was put into your GI tract during the procedure and reduce the bloating. If you had a lower endoscopy (such as a colonoscopy or flexible sigmoidoscopy) you may notice spotting of blood in your stool or on the toilet paper. If you underwent a bowel prep for your procedure, you may not have a normal bowel movement for a few days.  Please Note:  You might notice some irritation and congestion in your nose or some drainage.  This is from the oxygen used during your procedure.  There is no need for concern and it should clear up in a day or so.  SYMPTOMS TO REPORT IMMEDIATELY:  Following lower endoscopy (colonoscopy or flexible sigmoidoscopy):  Excessive amounts of blood in the stool  Significant tenderness or worsening of abdominal pains  Swelling of the abdomen that is new, acute  Fever of 100F or higher  For urgent or emergent issues, a gastroenterologist can be reached at any hour by calling [phone]. Do not use MyChart messaging for urgent concerns.    DIET:  We do recommend a small meal at first, but then  you may proceed to your regular diet.  Drink plenty of fluids but you should avoid alcoholic beverages for 24 hours.  ACTIVITY:  You should plan to take it easy for the rest of today and you should NOT DRIVE or use heavy machinery until tomorrow (because of the sedation medicines used during the test).    FOLLOW UP: Our staff will call the number listed on your records the next business day following your procedure.  We will call around 7:15- 8:00 am to check on you and address any questions or concerns that you may have regarding the information given to you following your procedure. If we do not reach you, we will leave a message.     If any biopsies were taken you will be contacted by phone or by letter within the next 1-3 weeks.  Please call us at [phone] if you have not heard about the biopsies in 3 weeks.    SIGNATURES/CONFIDENTIALITY: You and/or your care partner have signed paperwork which will be entered into your electronic medical record.  These signatures attest to the fact that that the information above on your After Visit Summary has been reviewed and is understood.  Full responsibility of the confidentiality of this discharge information lies with you and/or your care-partner.

## 2022-10-07 NOTE — Telephone Encounter (Signed)
Called pt Lvm to call our office back had question  Regarding Medication.

## 2022-10-07 NOTE — Progress Notes (Signed)
Pt resting comfortably. VSS. Airway intact. SBAR complete to RN. All questions answered.   

## 2022-10-07 NOTE — Progress Notes (Signed)
Called to room to assist during endoscopic procedure.  Patient ID and intended procedure confirmed with present staff. Received instructions for my participation in the procedure from the performing physician.  

## 2022-10-07 NOTE — Telephone Encounter (Signed)
Patient states his pharmacy is on back order for the Hydrocodone so he would like for it to be sent to [facility] pharmacy instead. Please advise.

## 2022-10-07 NOTE — Progress Notes (Signed)
GASTROENTEROLOGY PROCEDURE H&P NOTE   Primary Care Physician: [name], MD    Reason for Procedure:  Colon Cancer screening  Plan:    Colonoscopy  Patient is appropriate for endoscopic procedure(s) in the ambulatory ([place]) setting.  The nature of the procedure, as well as the risks, benefits, and alternatives were carefully and thoroughly reviewed with the patient. Ample time for discussion and questions allowed. The patient understood, was satisfied, and agreed to proceed.     HPI: Elijah Marquez is a 51 y.o. male who presents for colonoscopy for routine Colon Cancer screening.  No active GI symptoms.  No known family history of colon cancer or related malignancy.  Patient is otherwise without complaints or active issues today.  Past Medical History:  Diagnosis Date   Anxiety 08/23/2012   Anxiety as acute reaction to exceptional stress 09/20/2012   Chicken pox as a child   Chronic lower back pain    Fatigue    History of tobacco abuse    Failed Chantix and wellbutrin    Hyperlipidemia 08/23/2012   Hypertension    Hypogonadism in male 10/02/2014   Intermittent claudication ([place]) 08/13/2015   B/l legs   Neck pain on left side 08/23/2012   Osteoarthritis    "all over" (07/02/2017)   Osteoarthritis of both knees    Tobacco abuse     Past Surgical History:  Procedure Laterality Date   ANTERIOR CERVICAL DECOMP/DISCECTOMY FUSION  12/08/2012   C4,5,,6   BACK SURGERY     CORONARY ANGIOPLASTY WITH STENT PLACEMENT  07/02/2017   "1 stent"   CORONARY STENT INTERVENTION N/A 07/02/2017   Procedure: CORONARY STENT INTERVENTION;  Surgeon: Troy Sine, MD;  Location: [facility];  Service: Cardiovascular;  Laterality: N/A;  LAD/DIAG   KNEE ARTHROSCOPY Bilateral 2011-2012   left-right   LEFT HEART CATH AND CORONARY ANGIOGRAPHY N/A 07/02/2017   Procedure: Left Heart Cath and Coronary Angiography;  Surgeon: Troy Sine, MD;  Location: [place] [facility];   Service: Cardiovascular;  Laterality: N/A;   MULTIPLE TOOTH EXTRACTIONS     WISDOM TOOTH EXTRACTION      Prior to Admission medications   Medication Sig Start Date End Date Taking? Authorizing Provider  ALPRAZolam (XANAX) 1 MG tablet TAKE 1 TABLET (1 MG TOTAL) BY MOUTH 2 (TWO) TIMES DAILY AS NEEDED FOR SLEEP OR ANXIETY. 10/04/22  Yes [name], MD  aspirin EC 81 MG tablet Take 1 tablet (81 mg total) by mouth daily. Swallow whole. 09/06/22  Yes Debbrah Alar, NP  HYDROcodone-acetaminophen (NORCO) 10-325 MG tablet Take 1 tablet by mouth every 8 (eight) hours as needed for moderate pain or severe pain. 09/26/22  Yes [name], MD  metoprolol succinate (TOPROL-XL) 25 MG 24 hr tablet TAKE 3 TABLETS BY MOUTH DAILY 09/29/22  Yes Debbrah Alar, NP  rosuvastatin (CRESTOR) 40 MG tablet TAKE 1 TABLET BY MOUTH EVERY DAY 09/30/22  Yes [name], MD  Testosterone 20.25 MG/ACT (1.62%) GEL Apply 3 Pump topically daily. 09/20/22  Yes Debbrah Alar, NP  nitroGLYCERIN (NITROSTAT) 0.4 MG SL tablet Place 1 tablet (0.4 mg total) under the tongue every 5 (five) minutes as needed for chest pain. To ER if no resolution Patient not taking: Reported on 09/26/2022 08/10/19   [name], MD    Current Outpatient Medications  Medication Sig Dispense Refill   ALPRAZolam (XANAX) 1 MG tablet TAKE 1 TABLET (1 MG TOTAL) BY MOUTH 2 (TWO) TIMES DAILY  AS NEEDED FOR SLEEP OR ANXIETY. 60 tablet 1   aspirin EC 81 MG tablet Take 1 tablet (81 mg total) by mouth daily. Swallow whole. 30 tablet 12   HYDROcodone-acetaminophen (NORCO) 10-325 MG tablet Take 1 tablet by mouth every 8 (eight) hours as needed for moderate pain or severe pain. 60 tablet 0   metoprolol succinate (TOPROL-XL) 25 MG 24 hr tablet TAKE 3 TABLETS BY MOUTH DAILY 270 tablet 1   rosuvastatin (CRESTOR) 40 MG tablet TAKE 1 TABLET BY MOUTH EVERY DAY 90 tablet 1   Testosterone 20.25 MG/ACT (1.62%) GEL Apply 3 Pump topically daily. 150 g  3   nitroGLYCERIN (NITROSTAT) 0.4 MG SL tablet Place 1 tablet (0.4 mg total) under the tongue every 5 (five) minutes as needed for chest pain. To ER if no resolution (Patient not taking: Reported on 09/26/2022) 25 tablet 3   Current Facility-Administered Medications  Medication Dose Route Frequency Provider Last Rate Last Admin   0.9 %  sodium chloride infusion  500 mL Intravenous Once Dorrell Mitcheltree V, DO        Allergies as of 10/07/2022 - Review Complete 10/07/2022  Allergen Reaction Noted   Atorvastatin Other (See Comments) 02/14/2017   Penicillins Swelling and Rash 08/19/2012    Family History  Problem Relation Age of Onset   Fibromyalgia Mother    Coronary artery disease Mother    Hypertension Mother    Heart attack Mother    Heart disease Mother        CAD   Peripheral vascular disease Mother 64       hemorrhage from vascular procedure   Alcohol abuse Father    Fibromyalgia Sister    Hypertension Sister    Heart disease Sister    Lupus Sister    Emphysema Sister        smoker   Kidney disease Daughter    Other Son        mildly mentally handicap   Seizures Son    Colon cancer Neg Hx    Colon polyps Neg Hx    Crohn's disease Neg Hx    Esophageal cancer Neg Hx    Rectal cancer Neg Hx    Stomach cancer Neg Hx    Ulcerative colitis Neg Hx     Social History   Socioeconomic History   Marital status: Married    Spouse name: Not on file   Number of children: Not on file   Years of education: Not on file   Highest education level: Not on file  Occupational History   Not on file  Tobacco Use   Smoking status: Former    Packs/day: 1.50    Years: 35.00    Total pack years: 52.50    Types: Cigarettes    Quit date: 07/31/2015    Years since quitting: 7.1    Passive exposure: Never   Smokeless tobacco: Never  Vaping Use   Vaping Use: Former   Quit date: 06/16/2017  Substance and Sexual Activity   Alcohol use: No   Drug use: No   Sexual activity: Not  Currently    Partners: Female  Other Topics Concern   Not on file  Social History Narrative   Not on file   Social Determinants of Health   Financial Resource Strain: Not on file  Food Insecurity: Not on file  Transportation Needs: Not on file  Physical Activity: Not on file  Stress: Not on file  Social Connections: Not on file  Intimate Partner Violence: Not on file    Physical Exam: Vital signs in last 24 hours: '@BP'$  123/71   Pulse (!) 55   Temp 98.6 F (37 C)   Ht '5\' 9"'$  (1.753 m)   Wt 206 lb 12.8 oz (93.8 kg)   SpO2 95%   BMI 30.54 kg/m  GEN: NAD EYE: Sclerae anicteric ENT: MMM CV: Non-tachycardic Pulm: CTA b/l GI: Soft, NT/ND NEURO:  Alert & Oriented x 3   Gerrit Heck, DO [facility]   10/07/2022 1:18 PM

## 2022-10-07 NOTE — Telephone Encounter (Signed)
Patient called back stating he got a denial from [facility] regarding his Hydrocodone, because it had already been filled on 10/26. Patient states he did not request a refill on 10/26 and is unsure why it was refilled then. He stated the refill request was sent  on 11/02 and he did not pick up the medication on 10/26. He would like for his Hydrocodone to be sent to [facility]. Please advise.

## 2022-10-07 NOTE — Progress Notes (Signed)
Pt's states no medical or surgical changes since previsit or office visit. 

## 2022-10-07 NOTE — Telephone Encounter (Signed)
Patient said that the pharmacy downstairs no longer has the HYDROcodone-acetaminophen (NORCO) 10-325 MG tablet but they do have 5 mg and 7.5 mg so they advised him to call us back to see if Dr. Charlett Blake is okay to write the prescription under one of those doses to compensate for them not having the 10-325 mg. Please call patient to advise.

## 2022-10-08 ENCOUNTER — Telehealth: Payer: Self-pay

## 2022-10-08 NOTE — Telephone Encounter (Signed)
Left message on follow up call. 

## 2022-10-16 ENCOUNTER — Encounter: Payer: Self-pay | Admitting: Gastroenterology

## 2022-10-21 ENCOUNTER — Other Ambulatory Visit (INDEPENDENT_AMBULATORY_CARE_PROVIDER_SITE_OTHER): Payer: BC Managed Care – PPO

## 2022-10-21 DIAGNOSIS — E291 Testicular hypofunction: Secondary | ICD-10-CM | POA: Diagnosis not present

## 2022-10-22 ENCOUNTER — Telehealth: Payer: Self-pay | Admitting: Family

## 2022-10-22 LAB — TESTOSTERONE TOTAL,FREE,BIO, MALES
Albumin: 4.5 g/dL (ref 3.6–5.1)
Sex Hormone Binding: 15 nmol/L (ref 10–50)
Testosterone: 187 ng/dL — ABNORMAL LOW (ref 250–827)

## 2022-10-22 NOTE — Telephone Encounter (Signed)
Please advise pt that his testosterone level is improving but still low. I have down that he has been using 3 pumps of testosterone daily. Please confirm this and if that is accurate, we will increase to 4 pumps daily (and update medication instructions on med list) and repeat testosterone level in 1 month.

## 2022-10-22 NOTE — Telephone Encounter (Signed)
Called but no answer, lvm for patient to call back about results °

## 2022-10-23 NOTE — Telephone Encounter (Signed)
Patient informed of results and provider's advise.

## 2022-11-01 ENCOUNTER — Other Ambulatory Visit: Payer: Self-pay | Admitting: Family Medicine

## 2022-11-01 ENCOUNTER — Telehealth: Payer: Self-pay | Admitting: Family Medicine

## 2022-11-01 MED ORDER — HYDROCODONE-ACETAMINOPHEN 7.5-325 MG PO TABS: 1.0000 | ORAL_TABLET | Freq: Three times a day (TID) | ORAL | 0 refills | Status: DC | PRN

## 2022-11-01 NOTE — Telephone Encounter (Signed)
Pt stated he was only given the 7.5 last time because the 10s were out of stock.   Medication: HYDROcodone-acetaminophen (NORCO) 10-325 MG tablet  Has the patient contacted their pharmacy? Yes.     Preferred Pharmacy:    [facility], [address], [place] [address] Phone: [phone]  Fax: [phone]

## 2022-11-27 ENCOUNTER — Other Ambulatory Visit: Payer: Self-pay | Admitting: Family Medicine

## 2022-11-27 MED ORDER — HYDROCODONE-ACETAMINOPHEN 7.5-325 MG PO TABS: 1.0000 | ORAL_TABLET | Freq: Three times a day (TID) | ORAL | 0 refills | Status: DC | PRN

## 2022-11-27 NOTE — Telephone Encounter (Signed)
Requesting: Norco  Contract: 09/06/2022 UDS: 09/06/2022 Last Visit: 09/20/2022 Next Visit: 12/26/2022 Last Refill: 11/01/2022  Please Advise

## 2022-12-26 ENCOUNTER — Ambulatory Visit: Payer: BC Managed Care – PPO | Admitting: Family Medicine

## 2022-12-26 ENCOUNTER — Ambulatory Visit (INDEPENDENT_AMBULATORY_CARE_PROVIDER_SITE_OTHER): Payer: BC Managed Care – PPO | Admitting: Family Medicine

## 2022-12-26 ENCOUNTER — Encounter: Payer: Self-pay | Admitting: Family Medicine

## 2022-12-26 VITALS — BP 139/81 | HR 60 | Temp 97.8°F | Resp 16 | Ht 69.0 in | Wt 200.0 lb

## 2022-12-26 DIAGNOSIS — F419 Anxiety disorder, unspecified: Secondary | ICD-10-CM | POA: Diagnosis not present

## 2022-12-26 DIAGNOSIS — Z79899 Other long term (current) drug therapy: Secondary | ICD-10-CM

## 2022-12-26 DIAGNOSIS — M546 Pain in thoracic spine: Secondary | ICD-10-CM

## 2022-12-26 DIAGNOSIS — G8929 Other chronic pain: Secondary | ICD-10-CM

## 2022-12-26 DIAGNOSIS — M170 Bilateral primary osteoarthritis of knee: Secondary | ICD-10-CM | POA: Diagnosis not present

## 2022-12-26 DIAGNOSIS — F32A Depression, unspecified: Secondary | ICD-10-CM

## 2022-12-26 MED ORDER — ALPRAZOLAM 1 MG PO TABS: 1.0000 mg | ORAL_TABLET | Freq: Two times a day (BID) | ORAL | 0 refills | Status: DC | PRN

## 2022-12-26 MED ORDER — HYDROCODONE-ACETAMINOPHEN 10-325 MG PO TABS: 1.0000 | ORAL_TABLET | Freq: Two times a day (BID) | ORAL | 0 refills | Status: DC | PRN

## 2022-12-26 NOTE — Progress Notes (Signed)
Established Patient Office Visit  Subjective   Patient ID: Elijah Marquez, male    DOB: 1971/01/29  Age: 52 y.o. MRN: [id number]  Chief Complaint  Patient presents with   Follow-up    medications    HPI  Patient here to updated controlled substance agreement.   His last office visit here was 09/20/22 with Debbrah Alar for routine care. Last PCP visit was 12/07/21. Reports he is doing well today. No acute concerns or complaints. His next scheduled appointment with PCP is 01/21/23. He needs a refill on his Norco and Xanax.      ROS All review of systems negative except what is listed in the HPI    Objective:     BP 139/81   Pulse 60   Temp 97.8 F (36.6 C)   Resp 16   Ht '5\' 9"'$  (1.753 m)   Wt 200 lb (90.7 kg)   SpO2 98%   BMI 29.53 kg/m    Physical Exam Vitals reviewed.  Constitutional:      Appearance: Normal appearance.  Cardiovascular:     Rate and Rhythm: Normal rate and regular rhythm.     Pulses: Normal pulses.     Heart sounds: Normal heart sounds.  Pulmonary:     Effort: Pulmonary effort is normal.     Breath sounds: Normal breath sounds.  Skin:    General: Skin is warm and dry.  Neurological:     Mental Status: He is alert and oriented to person, place, and time.  Psychiatric:        Mood and Affect: Mood normal.        Behavior: Behavior normal.        Thought Content: Thought content normal.        Judgment: Judgment normal.      No results found for any visits on 12/26/22.    The ASCVD Risk score (Arnett DK, et al., 2019) failed to calculate for the following reasons:   The valid total cholesterol range is 130 to 320 mg/dL    Assessment & Plan:   1. Anxiety and depression Indication for controlled substance: anxiety Medication and dose: alprazolam 1 mg BID PRN # pills per month: 60 Last UDS date: 09/06/22 Controlled Substance Treatment Agreement signed (Y/N): Y, today Controlled Substance Treatment Agreement last reviewed  with patient:   NCCSRS/PDMP reviewed this encounter: Yes   - ALPRAZolam (XANAX) 1 MG tablet; Take 1 tablet (1 mg total) by mouth 2 (two) times daily as needed for sleep or anxiety.  Dispense: 60 tablet; Refill: 0  2. High risk medication use Refills today. Due for UDS and next follow-up. - ALPRAZolam (XANAX) 1 MG tablet; Take 1 tablet (1 mg total) by mouth 2 (two) times daily as needed for sleep or anxiety.  Dispense: 60 tablet; Refill: 0 - HYDROcodone-acetaminophen (NORCO) 10-325 MG tablet; Take 1 tablet by mouth 2 (two) times daily as needed.  Dispense: 60 tablet; Refill: 0  3. Osteoarthritis of both knees, unspecified osteoarthritis type 4. Chronic thoracic back pain, unspecified back pain laterality Indication for controlled substance: chronic pain Medication and dose: Norco 10-325 mg BID PRN (temporarily has been having to use 7.5 mg Norco due to supply issues, but was doing 1.5 tabs and running short each month; he wants to try for the 10 mg tabs again to see if back in stock) # pills per month: 60 Last UDS date: 09/06/22 Controlled Substance Treatment Agreement signed (Y/N): Y, today Controlled Substance Treatment Agreement  last reviewed with patient:   NCCSRS/PDMP reviewed this encounter: Yes  - HYDROcodone-acetaminophen (NORCO) 10-325 MG tablet; Take 1 tablet by mouth 2 (two) times daily as needed.  Dispense: 60 tablet; Refill: 0    Return for (as scheduled).    Terrilyn Saver, NP

## 2023-01-21 ENCOUNTER — Ambulatory Visit (INDEPENDENT_AMBULATORY_CARE_PROVIDER_SITE_OTHER): Payer: Self-pay | Admitting: Family Medicine

## 2023-01-21 VITALS — BP 130/78 | HR 65 | Temp 97.9°F | Resp 16 | Ht 69.0 in | Wt 203.0 lb

## 2023-01-21 DIAGNOSIS — E291 Testicular hypofunction: Secondary | ICD-10-CM

## 2023-01-21 DIAGNOSIS — M542 Cervicalgia: Secondary | ICD-10-CM

## 2023-01-21 DIAGNOSIS — I10 Essential (primary) hypertension: Secondary | ICD-10-CM

## 2023-01-21 DIAGNOSIS — R739 Hyperglycemia, unspecified: Secondary | ICD-10-CM

## 2023-01-21 DIAGNOSIS — M069 Rheumatoid arthritis, unspecified: Secondary | ICD-10-CM

## 2023-01-21 DIAGNOSIS — E782 Mixed hyperlipidemia: Secondary | ICD-10-CM

## 2023-01-21 DIAGNOSIS — I25119 Atherosclerotic heart disease of native coronary artery with unspecified angina pectoris: Secondary | ICD-10-CM
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
## 2023-01-22 LAB — CBC WITH DIFFERENTIAL/PLATELET
Basophils Absolute: 0.1 10*3/uL (ref 0.0–0.1)
Basophils Relative: 1.1 % (ref 0.0–3.0)
Eosinophils Absolute: 0.3 10*3/uL (ref 0.0–0.7)
Eosinophils Relative: 3.3 % (ref 0.0–5.0)
HCT: 46.6 % (ref 39.0–52.0)
Hemoglobin: 15.9 g/dL (ref 13.0–17.0)
Lymphocytes Relative: 34.7 % (ref 12.0–46.0)
Lymphs Abs: 3.1 10*3/uL (ref 0.7–4.0)
MCHC: 34.2 g/dL (ref 30.0–36.0)
MCV: 86.6 fl (ref 78.0–100.0)
Monocytes Absolute: 0.8 10*3/uL (ref 0.1–1.0)
Monocytes Relative: 8.9 % (ref 3.0–12.0)
Neutro Abs: 4.6 10*3/uL (ref 1.4–7.7)
Neutrophils Relative %: 52 % (ref 43.0–77.0)
Platelets: 214 10*3/uL (ref 150.0–400.0)
RBC: 5.38 Mil/uL (ref 4.22–5.81)
RDW: 12.9 % (ref 11.5–15.5)
WBC: 8.8 10*3/uL (ref 4.0–10.5)

## 2023-01-22 LAB — LIPID PANEL
Cholesterol: 127 mg/dL (ref 0–200)
HDL: 38.7 mg/dL — ABNORMAL LOW (ref >39.00)
NonHDL: 88.64
Total CHOL/HDL Ratio: 3
Triglycerides: 297 mg/dL — ABNORMAL HIGH (ref 0.0–149.0)
VLDL: 59.4 mg/dL — ABNORMAL HIGH (ref 0.0–40.0)

## 2023-01-22 LAB — COMPREHENSIVE METABOLIC PANEL
ALT: 35 U/L (ref 0–53)
AST: 32 U/L (ref 0–37)
Albumin: 4.8 g/dL (ref 3.5–5.2)
Alkaline Phosphatase: 75 U/L (ref 39–117)
BUN: 14 mg/dL (ref 6–23)
CO2: 27 mEq/L (ref 19–32)
Calcium: 10.3 mg/dL (ref 8.4–10.5)
Chloride: 100 mEq/L (ref 96–112)
Creatinine, Ser: 0.97 mg/dL (ref 0.40–1.50)
GFR: 88.99 mL/min (ref >60.00)
Glucose, Bld: 85 mg/dL (ref 70–99)
Potassium: 4 mEq/L (ref 3.5–5.1)
Sodium: 140 mEq/L (ref 135–145)
Total Bilirubin: 0.4 mg/dL (ref 0.2–1.2)
Total Protein: 7.5 g/dL (ref 6.0–8.3)

## 2023-01-22 LAB — TSH: TSH: 3.96 u[IU]/mL (ref 0.35–5.50)

## 2023-01-22 LAB — RHEUMATOID FACTOR: Rheumatoid fact SerPl-aCnc: <14 IU/mL (ref <14)

## 2023-01-22 LAB — HEMOGLOBIN A1C: Hgb A1c MFr Bld: 6.1 % (ref 4.6–6.5)

## 2023-01-22 LAB — TESTOSTERONE: Testosterone: 328.95 ng/dL (ref 300.00–890.00)

## 2023-01-23 ENCOUNTER — Telehealth: Payer: Self-pay | Admitting: Family Medicine

## 2023-01-23 DIAGNOSIS — M170 Bilateral primary osteoarthritis of knee: Secondary | ICD-10-CM

## 2023-01-23 DIAGNOSIS — Z79899 Other long term (current) drug therapy: Secondary | ICD-10-CM

## 2023-01-23 DIAGNOSIS — G8929 Other chronic pain: Secondary | ICD-10-CM

## 2023-01-23 LAB — LDL CHOLESTEROL, DIRECT: Direct LDL: 66 mg/dL

## 2023-01-24 MED ORDER — HYDROCODONE-ACETAMINOPHEN 10-325 MG PO TABS: 1.0000 | ORAL_TABLET | Freq: Two times a day (BID) | ORAL | 0 refills | Status: DC | PRN

## 2023-01-24 NOTE — Telephone Encounter (Signed)
Requesting: hydrocodone 10-'325mg'$   Contract: 09/06/22 UDS: 09/06/22 Last Visit: 01/21/23 Next Visit: 05/06/23 Last Refill 12/26/22 #60 and 0RF   Please Advise

## 2023-01-24 NOTE — Telephone Encounter (Signed)
PDMP okay, prescription sent 

## 2023-01-25 ENCOUNTER — Other Ambulatory Visit: Payer: Self-pay | Admitting: Family Medicine

## 2023-01-25 DIAGNOSIS — E291 Testicular hypofunction: Secondary | ICD-10-CM

## 2023-01-26 NOTE — Assessment & Plan Note (Signed)
Well controlled, no changes to meds. Encouraged heart healthy diet such as the DASH diet and exercise as tolerated.  °

## 2023-01-26 NOTE — Assessment & Plan Note (Signed)
Secondary to an old injury, stable and asymptomatic

## 2023-01-26 NOTE — Assessment & Plan Note (Signed)
Risk factors controlled, continue to monitor

## 2023-01-26 NOTE — Assessment & Plan Note (Signed)
hgba1c acceptable, minimize simple carbs. Increase exercise as tolerated.  

## 2023-01-26 NOTE — Assessment & Plan Note (Signed)
Encourage heart healthy diet such as MIND or DASH diet, increase exercise, avoid trans fats, simple carbohydrates and processed foods, consider a krill or fish or flaxseed oil cap daily. Tolerating Rosuvastatin

## 2023-01-26 NOTE — Assessment & Plan Note (Signed)
Referred to urology for further evaluation and treatment

## 2023-01-27 NOTE — Telephone Encounter (Signed)
Requesting: testosterone 20.'25mg'$ /act gel Contract: n/a UDS: n/a  Last Visit: 01/21/23 Next Visit: 05/06/23 Last Refill: 09/20/22 #150g and 3RF   Please Advise

## 2023-01-29 ENCOUNTER — Encounter: Payer: Self-pay | Admitting: Urology

## 2023-02-24 ENCOUNTER — Other Ambulatory Visit: Payer: Self-pay | Admitting: Internal Medicine

## 2023-02-24 DIAGNOSIS — M170 Bilateral primary osteoarthritis of knee: Secondary | ICD-10-CM

## 2023-02-24 DIAGNOSIS — Z79899 Other long term (current) drug therapy: Secondary | ICD-10-CM

## 2023-02-24 DIAGNOSIS — G8929 Other chronic pain: Secondary | ICD-10-CM

## 2023-02-24 MED ORDER — HYDROCODONE-ACETAMINOPHEN 10-325 MG PO TABS: 1.0000 | ORAL_TABLET | Freq: Two times a day (BID) | ORAL | 0 refills | Status: DC | PRN

## 2023-02-24 NOTE — Telephone Encounter (Signed)
Requesting: hydrocodone 10-325mg   Contract: 09/06/22 UDS: 09/06/22 Last Visit: 01/21/23 Next Visit: 05/06/23 Last Refill: 01/24/23 #60 and 0RF   Please Advise

## 2023-03-06 ENCOUNTER — Telehealth: Payer: Self-pay | Admitting: Family Medicine

## 2023-03-06 NOTE — Telephone Encounter (Signed)
Prescription Request  03/06/2023  Is this a "Controlled Substance" medicine? Yes  LOV: 01/21/2023  What is the name of the medication or equipment?   Testosterone 20.25 MG/ACT (1.62%) GEL [id number]   Have you contacted your pharmacy to request a refill? No   Which pharmacy would you like this sent to?   [place], [place] [facility] [place] [id number] [place] [address] Phone: [phone] Fax: [phone]  Patient notified that their request is being sent to the clinical staff for review and that they should receive a response within 2 business days.   Please advise at Mobile [phone] (mobile)

## 2023-03-07 ENCOUNTER — Other Ambulatory Visit: Payer: Self-pay | Admitting: Family Medicine

## 2023-03-07 DIAGNOSIS — E291 Testicular hypofunction: Secondary | ICD-10-CM

## 2023-03-07 MED ORDER — TESTOSTERONE 20.25 MG/ACT (1.62%) TD GEL: TRANSDERMAL | 1 refills | Status: DC

## 2023-03-10 ENCOUNTER — Telehealth: Payer: Self-pay

## 2023-03-10 NOTE — Telephone Encounter (Signed)
PA approved.   Request Reference Number: [id number]. TESTOSTERONE GEL 1.62% is approved through 03/09/2024. Your patient may now fill this prescription and it will be covered.

## 2023-03-10 NOTE — Telephone Encounter (Signed)
PA initiated via Covermymeds; KEY: [id number]. Awaiting determination.

## 2023-03-13 NOTE — Telephone Encounter (Signed)
Called pt was advised that PA was approved  And the pharmacy will need to call the insurance.

## 2023-03-13 NOTE — Telephone Encounter (Signed)
Patient called stating his pharmacy told him that the PA is still not pulling up on their end. Please advise.

## 2023-03-18 ENCOUNTER — Other Ambulatory Visit: Payer: Self-pay | Admitting: Family Medicine

## 2023-03-25 ENCOUNTER — Other Ambulatory Visit: Payer: Self-pay | Admitting: Family Medicine

## 2023-03-25 DIAGNOSIS — M170 Bilateral primary osteoarthritis of knee: Secondary | ICD-10-CM

## 2023-03-25 DIAGNOSIS — Z79899 Other long term (current) drug therapy: Secondary | ICD-10-CM

## 2023-03-25 DIAGNOSIS — G8929 Other chronic pain: Secondary | ICD-10-CM

## 2023-03-25 MED ORDER — HYDROCODONE-ACETAMINOPHEN 10-325 MG PO TABS: 1.0000 | ORAL_TABLET | Freq: Two times a day (BID) | ORAL | 0 refills | Status: DC | PRN

## 2023-03-25 NOTE — Telephone Encounter (Signed)
Patient states he's changing pharmacies, he would like all of his refills to be sent to:  [facility] [address] [place] - [address], [place] [address] Phone: [phone]  Fax: [phone]

## 2023-04-01 ENCOUNTER — Ambulatory Visit: Payer: BC Managed Care – PPO | Admitting: Family Medicine

## 2023-04-11 ENCOUNTER — Telehealth: Payer: Self-pay | Admitting: Family Medicine

## 2023-04-11 DIAGNOSIS — Z79899 Other long term (current) drug therapy: Secondary | ICD-10-CM

## 2023-04-11 DIAGNOSIS — F419 Anxiety disorder, unspecified: Secondary | ICD-10-CM

## 2023-04-11 NOTE — Telephone Encounter (Signed)
Requesting: alprazolam 1mg   Contract: 09/06/22 UDS: 09/06/22 Last Visit: 01/21/23 Next Visit: 05/06/23 Last Refill: 12/26/22 #60 and 0RF   Please Advise

## 2023-04-11 NOTE — Telephone Encounter (Signed)
Patient called and would like [facility] to [facility] in [place].

## 2023-04-12 MED ORDER — ALPRAZOLAM 1 MG PO TABS: 1.0000 mg | ORAL_TABLET | Freq: Two times a day (BID) | ORAL | 0 refills | Status: DC | PRN

## 2023-04-14 ENCOUNTER — Other Ambulatory Visit: Payer: Self-pay | Admitting: Family Medicine

## 2023-04-14 DIAGNOSIS — Z79899 Other long term (current) drug therapy: Secondary | ICD-10-CM

## 2023-04-14 DIAGNOSIS — F419 Anxiety disorder, unspecified: Secondary | ICD-10-CM

## 2023-04-14 MED ORDER — ALPRAZOLAM 1 MG PO TABS
1.00 mg | ORAL_TABLET | Freq: Two times a day (BID) | ORAL | 0 refills | Status: DC | PRN
Start: 2023-04-14 — End: 2023-06-10

## 2023-04-14 NOTE — Addendum Note (Signed)
Addended byConrad Alta D on: 04/14/2023 01:08 PM   Modules accepted: Orders

## 2023-04-14 NOTE — Telephone Encounter (Signed)
Pt called stating that his xanex was sent to the incorrect pharmacy. [facility] would [facility] and the following [facility] to send all future meds to:  [facility] [address] [place], [place] [address] P: [phone]

## 2023-04-22 ENCOUNTER — Other Ambulatory Visit: Payer: Self-pay | Admitting: Family Medicine

## 2023-04-22 DIAGNOSIS — Z79899 Other long term (current) drug therapy: Secondary | ICD-10-CM

## 2023-04-22 DIAGNOSIS — G8929 Other chronic pain: Secondary | ICD-10-CM

## 2023-04-22 DIAGNOSIS — M170 Bilateral primary osteoarthritis of knee: Secondary | ICD-10-CM

## 2023-04-22 NOTE — Telephone Encounter (Signed)
Requesting: hydrocodone 10-325mg   Contract: 09/06/22 UDS:  09/06/22 Last Visit: 01/21/23 Next Visit: 05/06/23 Last Refill:  03/25/23 #60 and 0RF   Please Advise

## 2023-04-23 MED ORDER — HYDROCODONE-ACETAMINOPHEN 10-325 MG PO TABS
1.0000 | ORAL_TABLET | Freq: Two times a day (BID) | ORAL | 0 refills | Status: DC | PRN
Start: 2023-04-23 — End: 2023-05-06

## 2023-04-23 NOTE — Telephone Encounter (Signed)
Patient states his medication has been sent to the wrong pharmacy again, he would like to know if there's a way to take the [facility] out of his records. He would like for only the [facility] pharmacy to show so no more issues occur. He needs his Hydrocodone sent to:   [facility] [address] [place] - [address], [place] [address] Phone: [phone]  Fax: [phone]

## 2023-05-05 NOTE — Assessment & Plan Note (Signed)
Well controlled, no changes to meds. Encouraged heart healthy diet such as the DASH diet and exercise as tolerated.  °

## 2023-05-05 NOTE — Assessment & Plan Note (Signed)
hgba1c acceptable, minimize simple carbs. Increase exercise as tolerated.  

## 2023-05-05 NOTE — Assessment & Plan Note (Signed)
Encourage heart healthy diet such as MIND or DASH diet, increase exercise, avoid trans fats, simple carbohydrates and processed foods, consider a krill or fish or flaxseed oil cap daily. Tolerating Rosuvastatin 

## 2023-05-05 NOTE — Assessment & Plan Note (Signed)
Encouraged moist heat and gentle stretching as tolerated. May try NSAIDs and prescription meds as directed and report if symptoms worsen or seek immediate care 

## 2023-05-06 ENCOUNTER — Ambulatory Visit: Payer: 59 | Admitting: Family Medicine

## 2023-05-06 ENCOUNTER — Other Ambulatory Visit: Payer: Self-pay

## 2023-05-06 VITALS — BP 132/80 | HR 58 | Temp 98.0°F | Resp 16 | Ht 66.0 in | Wt 198.0 lb

## 2023-05-06 DIAGNOSIS — M542 Cervicalgia: Secondary | ICD-10-CM

## 2023-05-06 DIAGNOSIS — Z79899 Other long term (current) drug therapy: Secondary | ICD-10-CM

## 2023-05-06 DIAGNOSIS — I10 Essential (primary) hypertension: Secondary | ICD-10-CM | POA: Diagnosis not present

## 2023-05-06 DIAGNOSIS — R739 Hyperglycemia, unspecified: Secondary | ICD-10-CM | POA: Diagnosis not present

## 2023-05-06 DIAGNOSIS — E782 Mixed hyperlipidemia: Secondary | ICD-10-CM | POA: Diagnosis not present

## 2023-05-06 DIAGNOSIS — M25561 Pain in right knee: Secondary | ICD-10-CM | POA: Diagnosis not present

## 2023-05-06 DIAGNOSIS — M79642 Pain in left hand: Secondary | ICD-10-CM | POA: Diagnosis not present

## 2023-05-06 DIAGNOSIS — M79641 Pain in right hand: Secondary | ICD-10-CM | POA: Diagnosis not present

## 2023-05-06 DIAGNOSIS — M170 Bilateral primary osteoarthritis of knee: Secondary | ICD-10-CM

## 2023-05-06 DIAGNOSIS — E291 Testicular hypofunction: Secondary | ICD-10-CM

## 2023-05-06 DIAGNOSIS — G8929 Other chronic pain: Secondary | ICD-10-CM

## 2023-05-06 DIAGNOSIS — M546 Pain in thoracic spine: Secondary | ICD-10-CM | POA: Diagnosis not present

## 2023-05-06 DIAGNOSIS — M25562 Pain in left knee: Secondary | ICD-10-CM

## 2023-05-06 MED ORDER — HYDROCODONE-ACETAMINOPHEN 10-325 MG PO TABS
1.0000 | ORAL_TABLET | Freq: Three times a day (TID) | ORAL | 0 refills | Status: DC | PRN
Start: 2023-05-06 — End: 2023-05-12

## 2023-05-06 MED ORDER — TESTOSTERONE 20.25 MG/ACT (1.62%) TD GEL
TRANSDERMAL | 1 refills | Status: DC
Start: 2023-05-06 — End: 2023-05-08

## 2023-05-06 NOTE — Progress Notes (Signed)
Subjective:   By signing my name below, I, Elijah Marquez, attest that this documentation has been prepared under the direction and in the presence of Bradd Canary, MD., 05/06/2023.   Patient ID: Elijah Marquez, male    DOB: May 05, 1971, 52 y.o.   MRN: [id number]  No chief complaint on file.  HPI Patient is in today for an office visit. Elijah Marquez denies recent hospitalization, febrile illness, CP/palpitations/SOB/HA/fever/chills/GI or GU symptoms.  Arthritic Pain Patient complains of worsening arthritis in his hands and neck which is affecting his daily habits. Elijah Marquez awakens each morning with swollen hands and reports that his thumbs and knuckles are extremely sore. Elijah Marquez is unable to form a fist and experiences numbness which is isolated in his hands. Elijah Marquez states that Elijah Marquez does have family history of rheumatoid arthritis. 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 Elijah Marquez currently takes Hydrocodone-Acetaminophen 10-325 mg   Essential Hypertension Patient continues taking Metoprolol-Succinate 25 mg three times daily which has been tolerable. Today his blood pressure and pulse are normal. BP Readings from Last 3 Encounters:  05/06/23 132/80  01/21/23 130/78  12/26/22 139/81   Pulse Readings from Last 3 Encounters:  05/06/23 (!) 58  01/21/23 65  12/26/22 60   Hyperlipidemia Patient continues taking Rosuvastatin 40 mg which has been tolerable.  Hypogonadism Patient continues using Testosterone 20.25 mg/act gel, which has been tolerable, and is requesting a refill on this today.  Immunizations Patient declines all available vaccinations, which include COVID-19, Influenza, Shingles, and Tetanus.  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   B/l legs   Neck pain on left side 08/23/2012   Osteoarthritis    "all over" (07/02/2017)   Osteoarthritis of both knees    Tobacco abuse     Past Surgical History:  Procedure Laterality Date   ANTERIOR CERVICAL DECOMP/DISCECTOMY FUSION  12/08/2012   C4,5,,6   BACK SURGERY     CORONARY ANGIOPLASTY WITH STENT PLACEMENT  07/02/2017   "1 stent"   CORONARY STENT INTERVENTION N/A 07/02/2017   Procedure: CORONARY STENT INTERVENTION;  Surgeon: Lennette Bihari, MD;  Location: [facility] INVASIVE CV LAB;  Service: Cardiovascular;  Laterality: N/A;  LAD/DIAG   KNEE ARTHROSCOPY Bilateral 2011-2012   left-right   LEFT HEART CATH AND CORONARY ANGIOGRAPHY N/A 07/02/2017   Procedure: Left Heart Cath and Coronary Angiography;  Surgeon: Lennette Bihari, MD;  Location: [facility] INVASIVE CV LAB;  Service: Cardiovascular;  Laterality: N/A;   MULTIPLE TOOTH EXTRACTIONS     WISDOM TOOTH EXTRACTION      Family History  Problem Relation Age of Onset   Fibromyalgia Mother    Coronary artery disease Mother    Hypertension Mother    Heart attack Mother    Heart disease Mother        CAD   Peripheral vascular disease Mother 29       hemorrhage from vascular procedure   Alcohol abuse Father    Fibromyalgia Sister    Hypertension Sister    Heart disease Sister    Lupus Sister  Emphysema Sister        smoker   Kidney disease Daughter    Other Son        mildly mentally handicap   Seizures Son    Colon cancer Neg Hx    Colon polyps Neg Hx    Crohn's disease Neg Hx    Esophageal cancer Neg Hx    Rectal cancer Neg Hx    Stomach cancer Neg Hx    Ulcerative colitis Neg Hx     Social History   Socioeconomic History   Marital status: Married    Spouse name: Not on file   Number of children: Not on file   Years of education: Not on file   Highest education level: Not  on file  Occupational History   Not on file  Tobacco Use   Smoking status: Former    Packs/day: 1.50    Years: 35.00    Additional pack years: 0.00    Total pack years: 52.50    Types: Cigarettes    Quit date: 07/31/2015    Years since quitting: 7.7    Passive exposure: Never   Smokeless tobacco: Never  Vaping Use   Vaping Use: Former   Quit date: 06/16/2017  Substance and Sexual Activity   Alcohol use: No   Drug use: No   Sexual activity: Not Currently    Partners: Female  Other Topics Concern   Not on file  Social History Narrative   Not on file   Social Determinants of Health   Financial Resource Strain: Not on file  Food Insecurity: Not on file  Transportation Needs: Not on file  Physical Activity: Not on file  Stress: Not on file  Social Connections: Not on file  Intimate Partner Violence: Not on file    Outpatient Medications Prior to Visit  Medication Sig Dispense Refill   ALPRAZolam (XANAX) 1 MG tablet Take 1 tablet (1 mg total) by mouth 2 (two) times daily as needed for sleep or anxiety. 60 tablet 0   aspirin EC 81 MG tablet Take 1 tablet (81 mg total) by mouth daily. Swallow whole. 30 tablet 12   HYDROcodone-acetaminophen (NORCO) 10-325 MG tablet Take 1 tablet by mouth 2 (two) times daily as needed. 60 tablet 0   metoprolol succinate (TOPROL-XL) 25 MG 24 hr tablet TAKE 3 TABLETS BY MOUTH DAILY 270 tablet 1   nitroGLYCERIN (NITROSTAT) 0.4 MG SL tablet Place 1 tablet (0.4 mg total) under the tongue every 5 (five) minutes as needed for chest pain. To ER if no resolution 25 tablet 3   rosuvastatin (CRESTOR) 40 MG tablet Take 1 tablet (40 mg total) by mouth daily. 90 tablet 1   Testosterone 20.25 MG/ACT (1.62%) GEL PLACE 4 PUMP ONTO THE SKIN DAILY. 150 g 1   No facility-administered medications prior to visit.    Allergies  Allergen Reactions   Atorvastatin Other (See Comments)    Caused leg pain   Penicillins Swelling and Rash    Has patient had a PCN  reaction causing immediate rash, facial/tongue/throat swelling, SOB or lightheadedness with hypotension: Yes Has patient had a PCN reaction causing severe rash involving mucus membranes or skin necrosis: No Has patient had a PCN reaction that required hospitalization: Yes Has patient had a PCN reaction occurring within the last 10 years: No If all of the above answers are "NO", then may proceed with Cephalosporin use.     Review of Systems  Constitutional:  Negative for chills and  fever.  Respiratory:  Negative for shortness of breath.   Cardiovascular:  Negative for chest pain and palpitations.  Gastrointestinal:  Negative for abdominal pain, blood in stool, constipation, diarrhea, nausea and vomiting.  Genitourinary:  Negative for dysuria, frequency, hematuria and urgency.  Skin:           Neurological:  Negative for headaches.       Objective:    Physical Exam Constitutional:      General: Elijah Marquez is not in acute distress.    Appearance: Normal appearance. Elijah Marquez is not ill-appearing.  HENT:     Head: Normocephalic and atraumatic.     Right Ear: External ear normal.     Left Ear: External ear normal.     Nose: Nose normal.     Mouth/Throat:     Mouth: Mucous membranes are moist.     Pharynx: Oropharynx is clear.  Eyes:     General:        Right eye: No discharge.        Left eye: No discharge.     Extraocular Movements: Extraocular movements intact.     Conjunctiva/sclera: Conjunctivae normal.     Pupils: Pupils are equal, round, and reactive to light.  Cardiovascular:     Rate and Rhythm: Normal rate and regular rhythm.     Pulses: Normal pulses.     Heart sounds: Normal heart sounds. No murmur heard.    No gallop.  Pulmonary:     Effort: Pulmonary effort is normal. No respiratory distress.     Breath sounds: Normal breath sounds. No wheezing or rales.  Abdominal:     General: Bowel sounds are normal.     Palpations: Abdomen is soft.     Tenderness: There is no  abdominal tenderness. There is no guarding.  Musculoskeletal:        General: Normal range of motion.     Cervical back: Normal range of motion.     Right lower leg: No edema.     Left lower leg: No edema.  Skin:    General: Skin is warm and dry.  Neurological:     Mental Status: Elijah Marquez is alert and oriented to person, place, and time.  Psychiatric:        Mood and Affect: Mood normal.        Behavior: Behavior normal.        Judgment: Judgment normal.     There were no vitals taken for this visit. Wt Readings from Last 3 Encounters:  01/21/23 203 lb (92.1 kg)  12/26/22 200 lb (90.7 kg)  10/07/22 206 lb 12.8 oz (93.8 kg)    Diabetic Foot Exam - Simple   No data filed    Lab Results  Component Value Date   WBC 8.8 01/21/2023   HGB 15.9 01/21/2023   HCT 46.6 01/21/2023   PLT 214.0 01/21/2023   GLUCOSE 85 01/21/2023   CHOL 127 01/21/2023   TRIG 297.0 (H) 01/21/2023   HDL 38.70 (L) 01/21/2023   LDLDIRECT 66.0 01/21/2023   LDLCALC 47 09/06/2022   ALT 35 01/21/2023   AST 32 01/21/2023   NA 140 01/21/2023   K 4.0 01/21/2023   CL 100 01/21/2023   CREATININE 0.97 01/21/2023   BUN 14 01/21/2023   CO2 27 01/21/2023   TSH 3.96 01/21/2023   PSA 0.34 05/22/2021   INR 0.97 07/02/2017   HGBA1C 6.1 01/21/2023   MICROALBUR 0.7 08/25/2018    Lab Results  Component Value  Date   TSH 3.96 01/21/2023   Lab Results  Component Value Date   WBC 8.8 01/21/2023   HGB 15.9 01/21/2023   HCT 46.6 01/21/2023   MCV 86.6 01/21/2023   PLT 214.0 01/21/2023   Lab Results  Component Value Date   NA 140 01/21/2023   K 4.0 01/21/2023   CO2 27 01/21/2023   GLUCOSE 85 01/21/2023   BUN 14 01/21/2023   CREATININE 0.97 01/21/2023   BILITOT 0.4 01/21/2023   ALKPHOS 75 01/21/2023   AST 32 01/21/2023   ALT 35 01/21/2023   PROT 7.5 01/21/2023   ALBUMIN 4.8 01/21/2023   CALCIUM 10.3 01/21/2023   ANIONGAP 7 07/03/2017   GFR 88.99 01/21/2023   Lab Results  Component Value Date   CHOL  127 01/21/2023   Lab Results  Component Value Date   HDL 38.70 (L) 01/21/2023   Lab Results  Component Value Date   LDLCALC 47 09/06/2022   Lab Results  Component Value Date   TRIG 297.0 (H) 01/21/2023   Lab Results  Component Value Date   CHOLHDL 3 01/21/2023   Lab Results  Component Value Date   HGBA1C 6.1 01/21/2023      Assessment & Plan:  Arthritic Pain: Hydrocodone-Acetaminophen 10-325 mg increased to 1 tablet 3 times daily. Referral placed to orthopedics.   Essential Hypertension: This is well-controlled with Metoprolol Succinate 25 mg three times daily. There were no medication adjustments today.  Hyperglycemia: This is controlled and monitored. Encouraged 6-8 hours of sleep, heart healthy diet, 60-80 oz of non-alcohol/non-caffeinated fluids, and 4000-8000 steps daily.  Hyperlipidemia: This is well-controlled with Rosuvastatin 40 mg daily. There were no medication adjustments today.  Hypogonadism: Testosterone 20.25 mg/act gel refilled.  Immunizations: Encouraged patient to consider annual COVID-19 and Influenza vaccinations as well as Shingles vaccination. Elijah Marquez declines all offered vaccinations.  Labs: Routine blood work ordered.  Problem List Items Addressed This Visit       Cardiovascular and Mediastinum   Essential hypertension - Primary     Other   Hyperlipidemia   Chronic back pain   Hyperglycemia   No orders of the defined types were placed in this encounter.  I, Elijah Marquez, personally preformed the services described in this documentation.  All medical record entries made by the scribe were at my direction and in my presence.  I have reviewed the chart and discharge instructions (if applicable) and agree that the record reflects my personal performance and is accurate and complete. 05/06/2023  I,Elijah Marquez,acting as a scribe for Elijah Edge, MD.,have documented all relevant documentation on the behalf of Elijah Edge, MD,as directed by   Elijah Edge, MD while in the presence of Elijah Edge, MD.  Elijah Marquez

## 2023-05-06 NOTE — Patient Instructions (Signed)

## 2023-05-07 DIAGNOSIS — M79641 Pain in right hand: Secondary | ICD-10-CM | POA: Insufficient documentation

## 2023-05-07 DIAGNOSIS — G8929 Other chronic pain: Secondary | ICD-10-CM | POA: Insufficient documentation

## 2023-05-07 LAB — CBC WITH DIFFERENTIAL/PLATELET
Basophils Absolute: 0.1 10*3/uL (ref 0.0–0.1)
Basophils Relative: 0.7 % (ref 0.0–3.0)
Eosinophils Absolute: 0.3 10*3/uL (ref 0.0–0.7)
Eosinophils Relative: 3.4 % (ref 0.0–5.0)
HCT: 43.3 % (ref 39.0–52.0)
Hemoglobin: 14.7 g/dL (ref 13.0–17.0)
Lymphocytes Relative: 34.5 % (ref 12.0–46.0)
Lymphs Abs: 3.1 10*3/uL (ref 0.7–4.0)
MCHC: 34 g/dL (ref 30.0–36.0)
MCV: 86.1 fl (ref 78.0–100.0)
Monocytes Absolute: 0.7 10*3/uL (ref 0.1–1.0)
Monocytes Relative: 8.4 % (ref 3.0–12.0)
Neutro Abs: 4.7 10*3/uL (ref 1.4–7.7)
Neutrophils Relative %: 53 % (ref 43.0–77.0)
Platelets: 211 10*3/uL (ref 150.0–400.0)
RBC: 5.03 Mil/uL (ref 4.22–5.81)
RDW: 13.2 % (ref 11.5–15.5)
WBC: 8.9 10*3/uL (ref 4.0–10.5)

## 2023-05-07 LAB — COMPREHENSIVE METABOLIC PANEL
ALT: 31 U/L (ref 0–53)
AST: 25 U/L (ref 0–37)
Albumin: 4.7 g/dL (ref 3.5–5.2)
Alkaline Phosphatase: 70 U/L (ref 39–117)
BUN: 17 mg/dL (ref 6–23)
CO2: 22 mEq/L (ref 19–32)
Calcium: 9.7 mg/dL (ref 8.4–10.5)
Chloride: 102 mEq/L (ref 96–112)
Creatinine, Ser: 0.89 mg/dL (ref 0.40–1.50)
GFR: 97.5 mL/min (ref >60.00)
Glucose, Bld: 111 mg/dL — ABNORMAL HIGH (ref 70–99)
Potassium: 4.1 mEq/L (ref 3.5–5.1)
Sodium: 137 mEq/L (ref 135–145)
Total Bilirubin: 0.5 mg/dL (ref 0.2–1.2)
Total Protein: 7.4 g/dL (ref 6.0–8.3)

## 2023-05-07 LAB — LDL CHOLESTEROL, DIRECT: Direct LDL: 55 mg/dL

## 2023-05-07 LAB — HIGH SENSITIVITY CRP: CRP, High Sensitivity: 1.99 mg/L (ref 0.000–5.000)

## 2023-05-07 LAB — ANA: Anti Nuclear Antibody (ANA): NEGATIVE

## 2023-05-07 LAB — LIPID PANEL
Cholesterol: 107 mg/dL (ref 0–200)
HDL: 38 mg/dL — ABNORMAL LOW (ref >39.00)
NonHDL: 69.14
Total CHOL/HDL Ratio: 3
Triglycerides: 273 mg/dL — ABNORMAL HIGH (ref 0.0–149.0)
VLDL: 54.6 mg/dL — ABNORMAL HIGH (ref 0.0–40.0)

## 2023-05-07 LAB — URIC ACID: Uric Acid, Serum: 5.2 mg/dL (ref 4.0–7.8)

## 2023-05-07 LAB — TSH: TSH: 4.7 u[IU]/mL (ref 0.35–5.50)

## 2023-05-07 LAB — RHEUMATOID FACTOR: Rheumatoid fact SerPl-aCnc: <10 IU/mL (ref <14)

## 2023-05-07 LAB — HEMOGLOBIN A1C: Hgb A1c MFr Bld: 6.2 % (ref 4.6–6.5)

## 2023-05-07 LAB — TESTOSTERONE: Testosterone: 249.84 ng/dL — ABNORMAL LOW (ref 300.00–890.00)

## 2023-05-07 LAB — SEDIMENTATION RATE: Sed Rate: 18 mm/hr (ref 0–20)

## 2023-05-07 NOTE — Assessment & Plan Note (Signed)
His hands hurt and go numb regularly with use suspicious for CTS referred to ortho for consideration

## 2023-05-07 NOTE — Assessment & Plan Note (Signed)
Stay as active as able. Encouraged moist heat and gentle stretching as tolerated. May try NSAIDs and prescription meds as directed and report if symptoms worsen or seek immediate care

## 2023-05-07 NOTE — Assessment & Plan Note (Signed)
Supplement and monitor 
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
## 2023-05-08 ENCOUNTER — Other Ambulatory Visit: Payer: Self-pay | Admitting: Family Medicine

## 2023-05-08 ENCOUNTER — Other Ambulatory Visit: Payer: Self-pay

## 2023-05-08 DIAGNOSIS — E291 Testicular hypofunction: Secondary | ICD-10-CM

## 2023-05-08 MED ORDER — TESTOSTERONE 20.25 MG/ACT (1.62%) TD GEL
TRANSDERMAL | 1 refills | Status: DC
Start: 2023-05-08 — End: 2023-05-12

## 2023-05-09 ENCOUNTER — Telehealth: Payer: Self-pay | Admitting: Family Medicine

## 2023-05-09 NOTE — Telephone Encounter (Signed)
[facility] on [address] in [place]. Please send his HYDROcodone-acetaminophen (NORCO) 10-325 MG tablet  and  Testosterone 20.25 MG/ACT (1.62%) GEL [[id number]] to [facility]. It was sent to the incorrect pharmacy when he was here.

## 2023-05-12 ENCOUNTER — Other Ambulatory Visit: Payer: Self-pay

## 2023-05-12 ENCOUNTER — Other Ambulatory Visit: Payer: Self-pay | Admitting: Family Medicine

## 2023-05-12 DIAGNOSIS — E291 Testicular hypofunction: Secondary | ICD-10-CM

## 2023-05-12 DIAGNOSIS — Z79899 Other long term (current) drug therapy: Secondary | ICD-10-CM

## 2023-05-12 DIAGNOSIS — G8929 Other chronic pain: Secondary | ICD-10-CM

## 2023-05-12 DIAGNOSIS — M170 Bilateral primary osteoarthritis of knee: Secondary | ICD-10-CM

## 2023-05-12 MED ORDER — TESTOSTERONE 20.25 MG/ACT (1.62%) TD GEL
TRANSDERMAL | 1 refills | Status: DC
Start: 2023-05-12 — End: 2023-09-26

## 2023-05-12 MED ORDER — HYDROCODONE-ACETAMINOPHEN 10-325 MG PO TABS
1.0000 | ORAL_TABLET | Freq: Three times a day (TID) | ORAL | 0 refills | Status: DC | PRN
Start: 2023-05-12 — End: 2023-05-26

## 2023-05-12 NOTE — Telephone Encounter (Signed)
[facility] and [facility]

## 2023-05-23 ENCOUNTER — Telehealth: Payer: Self-pay | Admitting: Family Medicine

## 2023-05-23 NOTE — Telephone Encounter (Signed)
Patient called and stated needs provider to call [phone] Dr. Madilyn Fireman to verify some medications. The employer sent patient to do a drug test and just needs to verify.

## 2023-05-26 ENCOUNTER — Other Ambulatory Visit: Payer: Self-pay | Admitting: Family Medicine

## 2023-05-26 ENCOUNTER — Telehealth: Payer: Self-pay | Admitting: Family Medicine

## 2023-05-26 DIAGNOSIS — M170 Bilateral primary osteoarthritis of knee: Secondary | ICD-10-CM

## 2023-05-26 DIAGNOSIS — Z79899 Other long term (current) drug therapy: Secondary | ICD-10-CM

## 2023-05-26 DIAGNOSIS — G8929 Other chronic pain: Secondary | ICD-10-CM

## 2023-05-26 MED ORDER — HYDROCODONE-ACETAMINOPHEN 10-325 MG PO TABS
1.0000 | ORAL_TABLET | Freq: Three times a day (TID) | ORAL | 0 refills | Status: DC | PRN
Start: 2023-05-26 — End: 2023-06-29

## 2023-05-26 NOTE — Telephone Encounter (Signed)
Patient called and states [facility] ([address] in [place]) needs a PA for the HYDROcodone-acetaminophen (NORCO) 10-325 MG tablet   Please call

## 2023-05-26 NOTE — Telephone Encounter (Signed)
Called Dr. Madilyn Fireman office back, their office was able to speak with pts PCP to verify pts current medications.

## 2023-05-28 NOTE — Telephone Encounter (Signed)
We just received a Pa and will take a look at it today.

## 2023-05-28 NOTE — Telephone Encounter (Signed)
Pt called back to follow up on PA status.

## 2023-05-29 ENCOUNTER — Telehealth: Payer: Self-pay | Admitting: *Deleted

## 2023-05-29 NOTE — Telephone Encounter (Signed)
Prior auth started via cover my meds.  Awaiting determination.  KeyGita Kudo

## 2023-05-30 NOTE — Telephone Encounter (Signed)
Your request for Hydroco/Apap Tab 10-325mg  has been approved. How long does this approval last? HYDROCO/APAP TAB 10-325MG , use as directed (90 per month), is approved for 1 month through 7/ 27/2024 or until coverage for the medication is no longer available under your benefit plan or the medication becomes subject to a pharmacy benefit coverage requirement, such as supply limits or notification, whichever occurs first as allowed by law. Please note: Doses/quantities above plan limits and/or maximum Food and Drug Administration (FDA) approved dosing may be subject to further review. Reviewed by: Blain Pais.Ph

## 2023-05-31 ENCOUNTER — Other Ambulatory Visit: Payer: Self-pay | Admitting: Family

## 2023-06-10 ENCOUNTER — Other Ambulatory Visit: Payer: Self-pay | Admitting: Family Medicine

## 2023-06-10 DIAGNOSIS — Z79899 Other long term (current) drug therapy: Secondary | ICD-10-CM

## 2023-06-10 DIAGNOSIS — F419 Anxiety disorder, unspecified: Secondary | ICD-10-CM

## 2023-06-10 MED ORDER — ALPRAZOLAM 1 MG PO TABS
1.0000 mg | ORAL_TABLET | Freq: Two times a day (BID) | ORAL | 0 refills | Status: DC | PRN
Start: 2023-06-10 — End: 2023-07-28

## 2023-06-10 NOTE — Telephone Encounter (Signed)
Requesting: alprazolam 1mg   Contract: 12/26/22 UDS:09/06/22 Last Visit: 05/06/23 Next Visit: 11/13/23 Last Refill: 04/14/23 #60 and 0RF  Please Advise

## 2023-06-29 ENCOUNTER — Other Ambulatory Visit: Payer: Self-pay | Admitting: Family Medicine

## 2023-06-29 DIAGNOSIS — Z79899 Other long term (current) drug therapy: Secondary | ICD-10-CM

## 2023-06-29 DIAGNOSIS — M170 Bilateral primary osteoarthritis of knee: Secondary | ICD-10-CM

## 2023-06-29 DIAGNOSIS — G8929 Other chronic pain: Secondary | ICD-10-CM

## 2023-06-30 MED ORDER — HYDROCODONE-ACETAMINOPHEN 10-325 MG PO TABS
1.0000 | ORAL_TABLET | Freq: Three times a day (TID) | ORAL | 0 refills | Status: DC | PRN
Start: 2023-06-30 — End: 2023-07-31

## 2023-06-30 NOTE — Telephone Encounter (Signed)
Requesting: hydrocodone 10-325mg   Contract: 12/26/22 UDS: 09/06/22 Last Visit: 05/06/23 Next Visit: 11/13/23 Last Refill: 05/26/23 #90 and 0RF  Please Advise

## 2023-07-03 ENCOUNTER — Telehealth: Payer: Self-pay | Admitting: Family Medicine

## 2023-07-03 ENCOUNTER — Ambulatory Visit: Payer: 59 | Admitting: Orthopedic Surgery

## 2023-07-03 NOTE — Telephone Encounter (Signed)
Pt called to advise that we need to submit a prior authorization on his hydrocodone. Pt had to pay $70 out of his pocket because no authorization was done but he said if we do the authorization and submit it, he will get reimbursed the difference of the actual copay versus what he paid. He said it needs to be dated for 07/01/23. Please advise patient when done.

## 2023-07-04 ENCOUNTER — Telehealth: Payer: Self-pay

## 2023-07-04 ENCOUNTER — Other Ambulatory Visit (HOSPITAL_COMMUNITY): Payer: Self-pay

## 2023-07-04 NOTE — Telephone Encounter (Signed)
PA request has been Approved. New Encounter created for follow up. For additional info see Pharmacy Prior Auth telephone encounter from 07/04/23.

## 2023-07-04 NOTE — Telephone Encounter (Signed)
Pharmacy Patient Advocate Encounter  Received notification from [facility] that Prior Authorization for HYDROcodone-Acetaminophen 10-325MG  tablets has been APPROVED from 07/04/23 to 12/31/23. Ran test claim, Copay is $5.20  PA #/Case ID/Reference #: [id number]

## 2023-07-07 NOTE — Telephone Encounter (Signed)
Sent pt message letting him know PA was done.

## 2023-07-28 ENCOUNTER — Other Ambulatory Visit: Payer: Self-pay | Admitting: Family Medicine

## 2023-07-28 DIAGNOSIS — F419 Anxiety disorder, unspecified: Secondary | ICD-10-CM

## 2023-07-28 DIAGNOSIS — Z79899 Other long term (current) drug therapy: Secondary | ICD-10-CM

## 2023-07-28 MED ORDER — ALPRAZOLAM 1 MG PO TABS
1.0000 mg | ORAL_TABLET | Freq: Two times a day (BID) | ORAL | 0 refills | Status: DC | PRN
Start: 2023-07-28 — End: 2023-10-08

## 2023-07-28 NOTE — Telephone Encounter (Signed)
Requesting:alprazolam 1mg   Contract: 12/26/22 UDS: 09/06/22 Last Visit: 05/06/23 Next Visit: 11/13/23 Last Refill: 06/10/23 #60 and 0RF   Please Advise

## 2023-07-31 ENCOUNTER — Other Ambulatory Visit: Payer: Self-pay | Admitting: Family Medicine

## 2023-07-31 DIAGNOSIS — Z79899 Other long term (current) drug therapy: Secondary | ICD-10-CM

## 2023-07-31 DIAGNOSIS — M170 Bilateral primary osteoarthritis of knee: Secondary | ICD-10-CM

## 2023-07-31 DIAGNOSIS — G8929 Other chronic pain: Secondary | ICD-10-CM

## 2023-07-31 NOTE — Telephone Encounter (Signed)
Requesting: hydrocodone 10-325mg   Contract: 12/26/22 UDS: 09/06/22 Last Visit: 05/06/23 Next Visit: 11/13/23 Last Refill: 06/30/23 #90 and 0RF   Please Advise

## 2023-08-01 MED ORDER — HYDROCODONE-ACETAMINOPHEN 10-325 MG PO TABS
1.0000 | ORAL_TABLET | Freq: Three times a day (TID) | ORAL | 0 refills | Status: DC | PRN
Start: 2023-08-01 — End: 2023-09-11

## 2023-09-11 ENCOUNTER — Other Ambulatory Visit: Payer: Self-pay | Admitting: Family Medicine

## 2023-09-11 DIAGNOSIS — Z79899 Other long term (current) drug therapy: Secondary | ICD-10-CM

## 2023-09-11 DIAGNOSIS — M170 Bilateral primary osteoarthritis of knee: Secondary | ICD-10-CM

## 2023-09-11 DIAGNOSIS — G8929 Other chronic pain: Secondary | ICD-10-CM

## 2023-09-12 NOTE — Telephone Encounter (Signed)
Requesting: hydrocodone 10-325mg  Contract: 12/26/22 UDS: 09/06/22 Last Visit: 05/06/23 Next Visit: 11/13/23 Last Refill: 08/01/23 #90 and 0RF  Please Advise

## 2023-09-13 MED ORDER — HYDROCODONE-ACETAMINOPHEN 10-325 MG PO TABS
1.0000 | ORAL_TABLET | Freq: Three times a day (TID) | ORAL | 0 refills | Status: DC | PRN
Start: 2023-09-13 — End: 2023-10-22

## 2023-09-25 ENCOUNTER — Other Ambulatory Visit: Payer: Self-pay | Admitting: Family Medicine

## 2023-09-25 DIAGNOSIS — E291 Testicular hypofunction: Secondary | ICD-10-CM

## 2023-09-26 NOTE — Telephone Encounter (Signed)
Last written 05/12/2023 #150 g with 1 refill Last appt 05/06/2023 Lab Results  Component Value Date   TESTOSTERONE 249.84 (L) 05/06/2023

## 2023-10-08 ENCOUNTER — Other Ambulatory Visit: Payer: Self-pay | Admitting: Family Medicine

## 2023-10-08 DIAGNOSIS — Z79899 Other long term (current) drug therapy: Secondary | ICD-10-CM

## 2023-10-08 DIAGNOSIS — F32A Depression, unspecified: Secondary | ICD-10-CM

## 2023-10-08 NOTE — Telephone Encounter (Signed)
Requesting: Xanax 1mg  Contract: 12/26/2022 UDS: 09/06/2022 Last Visit: 05/06/2023 Next Visit: 11/13/2023 Last Refill: 07/28/2023  Please Advise

## 2023-10-20 ENCOUNTER — Telehealth: Payer: Self-pay | Admitting: Family Medicine

## 2023-10-20 NOTE — Telephone Encounter (Signed)
Prescription Request  10/20/2023  Is this a "Controlled Substance" medicine? No  LOV: 05/06/2023  What is the name of the medication or equipment? HYDROcodone-acetaminophen (NORCO) 10-325 MG tablet  Have you contacted your pharmacy to request a refill? No   Which pharmacy would you like this sent to?  [facility] [address], [place] - [address] [place] [address] Phone: [phone] Fax: [phone]   Patient notified that their request is being sent to the clinical staff for review and that they should receive a response within 2 business days.   Please advise at [facility] [phone]

## 2023-10-22 ENCOUNTER — Other Ambulatory Visit: Payer: Self-pay | Admitting: Family

## 2023-10-22 DIAGNOSIS — M170 Bilateral primary osteoarthritis of knee: Secondary | ICD-10-CM

## 2023-10-22 DIAGNOSIS — G8929 Other chronic pain: Secondary | ICD-10-CM

## 2023-10-22 DIAGNOSIS — Z79899 Other long term (current) drug therapy: Secondary | ICD-10-CM

## 2023-10-22 MED ORDER — HYDROCODONE-ACETAMINOPHEN 10-325 MG PO TABS: 1.0000 | ORAL_TABLET | Freq: Three times a day (TID) | ORAL | 0 refills | Status: DC | PRN

## 2023-10-22 NOTE — Telephone Encounter (Signed)
Requesting:HYDROcodone-acetaminophen (NORCO) Contract:12/26/22 (w/Taylor Reola Calkins) UDS:09/06/2022 Last Visit:05/06/2023 Next Visit:None Last Refill:09/13/2023  Please Advise

## 2023-11-09 ENCOUNTER — Other Ambulatory Visit: Payer: Self-pay | Admitting: Family

## 2023-11-09 DIAGNOSIS — E291 Testicular hypofunction: Secondary | ICD-10-CM

## 2023-11-12 NOTE — Assessment & Plan Note (Signed)
hgba1c acceptable, minimize simple carbs. Increase exercise as tolerated.  

## 2023-11-12 NOTE — Assessment & Plan Note (Signed)
Well controlled, no changes to meds. Encouraged heart healthy diet such as the DASH diet and exercise as tolerated.  °

## 2023-11-12 NOTE — Assessment & Plan Note (Signed)
Asymptomatic. Continue medical management.

## 2023-11-13 ENCOUNTER — Ambulatory Visit (INDEPENDENT_AMBULATORY_CARE_PROVIDER_SITE_OTHER): Payer: 59 | Admitting: Family Medicine

## 2023-11-13 VITALS — BP 130/80 | HR 59 | Temp 98.0°F | Resp 16 | Ht 69.0 in | Wt 199.6 lb

## 2023-11-13 DIAGNOSIS — I25119 Atherosclerotic heart disease of native coronary artery with unspecified angina pectoris: Secondary | ICD-10-CM | POA: Diagnosis not present

## 2023-11-13 DIAGNOSIS — E349 Endocrine disorder, unspecified: Secondary | ICD-10-CM

## 2023-11-13 DIAGNOSIS — M79641 Pain in right hand: Secondary | ICD-10-CM

## 2023-11-13 DIAGNOSIS — R739 Hyperglycemia, unspecified: Secondary | ICD-10-CM | POA: Diagnosis not present

## 2023-11-13 DIAGNOSIS — Z79899 Other long term (current) drug therapy: Secondary | ICD-10-CM

## 2023-11-13 DIAGNOSIS — F419 Anxiety disorder, unspecified: Secondary | ICD-10-CM

## 2023-11-13 DIAGNOSIS — M546 Pain in thoracic spine: Secondary | ICD-10-CM | POA: Diagnosis not present

## 2023-11-13 DIAGNOSIS — F32A Depression, unspecified: Secondary | ICD-10-CM

## 2023-11-13 DIAGNOSIS — M79642 Pain in left hand: Secondary | ICD-10-CM

## 2023-11-13 DIAGNOSIS — Z0000 Encounter for general adult medical examination without abnormal findings: Secondary | ICD-10-CM

## 2023-11-13 DIAGNOSIS — E291 Testicular hypofunction: Secondary | ICD-10-CM

## 2023-11-13 DIAGNOSIS — E782 Mixed hyperlipidemia: Secondary | ICD-10-CM

## 2023-11-13 DIAGNOSIS — M25562 Pain in left knee: Secondary | ICD-10-CM

## 2023-11-13 DIAGNOSIS — I10 Essential (primary) hypertension: Secondary | ICD-10-CM | POA: Diagnosis not present

## 2023-11-13 DIAGNOSIS — G8929 Other chronic pain: Secondary | ICD-10-CM

## 2023-11-13 DIAGNOSIS — M25541 Pain in joints of right hand: Secondary | ICD-10-CM

## 2023-11-13 MED ORDER — TESTOSTERONE 1.62 % TD GEL: TRANSDERMAL | 0 refills | Status: DC

## 2023-11-13 MED ORDER — ALPRAZOLAM 1 MG PO TABS: 1.0000 mg | ORAL_TABLET | Freq: Two times a day (BID) | ORAL | 0 refills | Status: DC | PRN

## 2023-11-13 NOTE — Patient Instructions (Signed)

## 2023-11-13 NOTE — Assessment & Plan Note (Addendum)
Patient encouraged to maintain heart healthy diet, regular exercise, adequate sleep. Consider daily probiotics. Take medications as prescribed. Given and reviewed copy of ACP documents from [employer] and encouraged to complete and return. Labs ordered and reviewed   Colonoscopy 2023, repeat in 2028

## 2023-11-14 ENCOUNTER — Other Ambulatory Visit (INDEPENDENT_AMBULATORY_CARE_PROVIDER_SITE_OTHER): Payer: 59

## 2023-11-14 DIAGNOSIS — Z0000 Encounter for general adult medical examination without abnormal findings: Secondary | ICD-10-CM

## 2023-11-14 DIAGNOSIS — F419 Anxiety disorder, unspecified: Secondary | ICD-10-CM

## 2023-11-14 DIAGNOSIS — F32A Depression, unspecified: Secondary | ICD-10-CM

## 2023-11-14 DIAGNOSIS — E782 Mixed hyperlipidemia: Secondary | ICD-10-CM

## 2023-11-14 DIAGNOSIS — I10 Essential (primary) hypertension: Secondary | ICD-10-CM

## 2023-11-14 DIAGNOSIS — E349 Endocrine disorder, unspecified: Secondary | ICD-10-CM

## 2023-11-14 DIAGNOSIS — M25541 Pain in joints of right hand: Secondary | ICD-10-CM

## 2023-11-15 LAB — LIPID PANEL
Cholesterol: 209 mg/dL — ABNORMAL HIGH (ref <200)
HDL: 34 mg/dL — ABNORMAL LOW (ref >=40)
Non-HDL Cholesterol (Calc): 175 mg/dL — ABNORMAL HIGH (ref <130)
Total CHOL/HDL Ratio: 6.1 (calc) — ABNORMAL HIGH (ref <5.0)
Triglycerides: 621 mg/dL — ABNORMAL HIGH (ref <150)

## 2023-11-15 LAB — COMPREHENSIVE METABOLIC PANEL
AG Ratio: 1.9 (calc) (ref 1.0–2.5)
ALT: 24 U/L (ref 9–46)
AST: 24 U/L (ref 10–35)
Albumin: 4.6 g/dL (ref 3.6–5.1)
Alkaline phosphatase (APISO): 72 U/L (ref 35–144)
BUN: 15 mg/dL (ref 7–25)
CO2: 32 mmol/L (ref 20–32)
Calcium: 10.1 mg/dL (ref 8.6–10.3)
Chloride: 101 mmol/L (ref 98–110)
Creat: 0.93 mg/dL (ref 0.70–1.30)
Globulin: 2.4 g/dL (ref 1.9–3.7)
Glucose, Bld: 114 mg/dL — ABNORMAL HIGH (ref 65–99)
Potassium: 4.9 mmol/L (ref 3.5–5.3)
Sodium: 140 mmol/L (ref 135–146)
Total Bilirubin: 0.4 mg/dL (ref 0.2–1.2)
Total Protein: 7 g/dL (ref 6.1–8.1)

## 2023-11-15 LAB — CBC WITH DIFFERENTIAL/PLATELET
Absolute Lymphocytes: 3707 {cells}/uL (ref 850–3900)
Absolute Monocytes: 585 {cells}/uL (ref 200–950)
Basophils Absolute: 60 {cells}/uL (ref 0–200)
Basophils Relative: 0.7 %
Eosinophils Absolute: 258 {cells}/uL (ref 15–500)
Eosinophils Relative: 3 %
HCT: 42.5 % (ref 38.5–50.0)
Hemoglobin: 14.6 g/dL (ref 13.2–17.1)
MCH: 30.3 pg (ref 27.0–33.0)
MCHC: 34.4 g/dL (ref 32.0–36.0)
MCV: 88.2 fL (ref 80.0–100.0)
MPV: 10.7 fL (ref 7.5–12.5)
Monocytes Relative: 6.8 %
Neutro Abs: 3990 {cells}/uL (ref 1500–7800)
Neutrophils Relative %: 46.4 %
Platelets: 249 10*3/uL (ref 140–400)
RBC: 4.82 10*6/uL (ref 4.20–5.80)
RDW: 12.4 % (ref 11.0–15.0)
Total Lymphocyte: 43.1 %
WBC: 8.6 10*3/uL (ref 3.8–10.8)

## 2023-11-15 LAB — TESTOSTERONE: Testosterone: 120 ng/dL — ABNORMAL LOW (ref 250–827)

## 2023-11-15 LAB — PSA: PSA: 0.43 ng/mL (ref <=4.00)

## 2023-11-15 LAB — DM TEMPLATE

## 2023-11-15 LAB — SEDIMENTATION RATE: Sed Rate: 9 mm/h (ref 0–20)

## 2023-11-15 LAB — TSH: TSH: 3.13 m[IU]/L (ref 0.40–4.50)

## 2023-11-15 LAB — URIC ACID: Uric Acid, Serum: 6.6 mg/dL (ref 4.0–8.0)

## 2023-11-16 ENCOUNTER — Encounter: Payer: Self-pay | Admitting: Family Medicine

## 2023-11-16 LAB — DRUG MONITORING PANEL 376104, URINE
Alphahydroxyalprazolam: 76 ng/mL — ABNORMAL HIGH (ref <25)
Alphahydroxymidazolam: NEGATIVE ng/mL (ref <50)
Alphahydroxytriazolam: NEGATIVE ng/mL (ref <50)
Aminoclonazepam: NEGATIVE ng/mL (ref <25)
Amphetamines: NEGATIVE ng/mL (ref <500)
Barbiturates: NEGATIVE ng/mL (ref <300)
Benzodiazepines: POSITIVE ng/mL — AB (ref <100)
Cocaine Metabolite: NEGATIVE ng/mL (ref <150)
Codeine: NEGATIVE ng/mL (ref <50)
Desmethyltramadol: NEGATIVE ng/mL (ref <100)
Hydrocodone: 784 ng/mL — ABNORMAL HIGH (ref <50)
Hydromorphone: 141 ng/mL — ABNORMAL HIGH (ref <50)
Hydroxyethylflurazepam: NEGATIVE ng/mL (ref <50)
Lorazepam: NEGATIVE ng/mL (ref <50)
Morphine: NEGATIVE ng/mL (ref <50)
Nordiazepam: NEGATIVE ng/mL (ref <50)
Norhydrocodone: 1212 ng/mL — ABNORMAL HIGH (ref <50)
Opiates: POSITIVE ng/mL — AB (ref <100)
Oxazepam: NEGATIVE ng/mL (ref <50)
Oxycodone: NEGATIVE ng/mL (ref <100)
Temazepam: NEGATIVE ng/mL (ref <50)
Tramadol: NEGATIVE ng/mL (ref <100)
Tramadol: NEGATIVE ng/mL (ref <100)
medMATCH Summary: NEGATIVE ng/mL (ref <100)

## 2023-11-16 LAB — RHEUMATOID FACTOR: Rheumatoid fact SerPl-aCnc: <10 [IU]/mL (ref <14)

## 2023-11-16 LAB — DM TEMPLATE

## 2023-11-16 LAB — ANA: Anti Nuclear Antibody (ANA): NEGATIVE

## 2023-11-16 NOTE — Progress Notes (Signed)
Subjective:    Patient ID: Elijah Marquez, male    DOB: Nov 03, 1971, 52 y.o.   MRN: [id number]  Chief Complaint  Patient presents with   Annual Exam    HPI Discussed the use of AI scribe software for clinical note transcription with the patient, who gave verbal consent to proceed.  History of Present Illness   The patient, with a history of neck surgery and on testosterone therapy, presents with multiple complaints. The chief complaint is bilateral knee pain, which has been worsening over time. The patient reports difficulty in walking due to the pain, which is described as excruciating and feels like ice picks going through the knees. The patient also reports a lack of flexibility in the knees, making it difficult to perform activities such as squatting or getting up from a kneeling position.  In addition to knee pain, the patient also reports bilateral hand pain, which is particularly severe at night. The patient describes the pain as so severe that it wakes him up at night and makes it difficult to make a fist. The patient also reports a sensation of numbness in the hands while sitting, which he believes may be related to circulation.  The patient also reports a lack of energy, which he attributes to his pain rather than depression. He reports difficulty in performing tasks that require physical exertion, such as mowing the yard or washing a car.  The patient has stopped taking rosuvastatin, a cholesterol-lowering medication, due to leg pain. He reports that the medication was causing his legs to throb, similar to a previous experience with Lipitor. The patient is considering other options for managing his cholesterol, including fish oil and Super Beats, an over-the-counter supplement.        Past Medical History:  Diagnosis Date   Anxiety 08/23/2012   Anxiety as acute reaction to exceptional stress 09/20/2012   Chicken pox as a child   Chronic lower back pain    Fatigue    History  of tobacco abuse    Failed Chantix and wellbutrin    Hyperlipidemia 08/23/2012   Hypertension    Hypogonadism in male 10/02/2014   Intermittent claudication (HCC) 08/13/2015   B/l legs   Neck pain on left side 08/23/2012   Osteoarthritis    "all over" (07/02/2017)   Osteoarthritis of both knees    Tobacco abuse     Past Surgical History:  Procedure Laterality Date   ANTERIOR CERVICAL DECOMP/DISCECTOMY FUSION  12/08/2012   C4,5,,6   BACK SURGERY     CORONARY ANGIOPLASTY WITH STENT PLACEMENT  07/02/2017   "1 stent"   CORONARY STENT INTERVENTION N/A 07/02/2017   Procedure: CORONARY STENT INTERVENTION;  Surgeon: Lennette Bihari, MD;  Location: [facility] INVASIVE CV LAB;  Service: Cardiovascular;  Laterality: N/A;  LAD/DIAG   KNEE ARTHROSCOPY Bilateral 2011-2012   left-right   LEFT HEART CATH AND CORONARY ANGIOGRAPHY N/A 07/02/2017   Procedure: Left Heart Cath and Coronary Angiography;  Surgeon: Lennette Bihari, MD;  Location: [facility] INVASIVE CV LAB;  Service: Cardiovascular;  Laterality: N/A;   MULTIPLE TOOTH EXTRACTIONS     WISDOM TOOTH EXTRACTION      Family History  Problem Relation Age of Onset   Fibromyalgia Mother    Coronary artery disease Mother    Hypertension Mother    Heart attack Mother    Heart disease Mother        CAD   Peripheral vascular disease Mother 14  hemorrhage from vascular procedure   Alcohol abuse Father    Fibromyalgia Sister    Hypertension Sister    Heart disease Sister    Lupus Sister    Emphysema Sister        smoker   Kidney disease Daughter    Other Son        mildly mentally handicap   Seizures Son    Colon cancer Neg Hx    Colon polyps Neg Hx    Crohn's disease Neg Hx    Esophageal cancer Neg Hx    Rectal cancer Neg Hx    Stomach cancer Neg Hx    Ulcerative colitis Neg Hx     Social History   Socioeconomic History   Marital status: Married    Spouse name: Not on file   Number of children: Not on file   Years of education: Not on  file   Highest education level: Not on file  Occupational History   Not on file  Tobacco Use   Smoking status: Former    Current packs/day: 0.00    Average packs/day: 1.5 packs/day for 35.0 years (52.5 ttl pk-yrs)    Types: Cigarettes    Start date: 07/30/1980    Quit date: 07/31/2015    Years since quitting: 8.3    Passive exposure: Never   Smokeless tobacco: Never  Vaping Use   Vaping status: Former   Quit date: 06/16/2017  Substance and Sexual Activity   Alcohol use: No   Drug use: No   Sexual activity: Not Currently    Partners: Female  Other Topics Concern   Not on file  Social History Narrative   Not on file   Social Drivers of Health   Financial Resource Strain: Not on file  Food Insecurity: Not on file  Transportation Needs: Not on file  Physical Activity: Not on file  Stress: Not on file  Social Connections: Unknown (04/13/2022)   Received from [facility]   Social Network    Social Network: Not on file  Intimate Partner Violence: Unknown (03/06/2022)   Received from [facility], [facility]   HITS    Physically Hurt: Not on file    Insult or Talk Down To: Not on file    Threaten Physical Harm: Not on file    Scream or Curse: Not on file    Outpatient Medications Prior to Visit  Medication Sig Dispense Refill   aspirin EC 81 MG tablet Take 1 tablet (81 mg total) by mouth daily. Swallow whole. 30 tablet 12   HYDROcodone-acetaminophen (NORCO) 10-325 MG tablet Take 1 tablet by mouth 3 (three) times daily as needed. 90 tablet 0   metoprolol succinate (TOPROL-XL) 25 MG 24 hr tablet TAKE 3 TABLETS BY MOUTH DAILY 270 tablet 1   nitroGLYCERIN (NITROSTAT) 0.4 MG SL tablet Place 1 tablet (0.4 mg total) under the tongue every 5 (five) minutes as needed for chest pain. To ER if no resolution 25 tablet 3   ALPRAZolam (XANAX) 1 MG tablet TAKE 1 TABLET BY MOUTH TWICE DAILY AS NEEDED FOR SLEEP OR ANXIETY 60 tablet 0   Testosterone 1.62 % GEL PLACE 4 PUMPS  ONTO THE SKIN DAILY 150 g 0   rosuvastatin (CRESTOR) 40 MG tablet Take 1 tablet (40 mg total) by mouth daily. (Patient not taking: Reported on 11/13/2023) 90 tablet 1   No facility-administered medications prior to visit.    Allergies  Allergen Reactions   Atorvastatin Other (See  Comments)    Caused leg pain   Rosuvastatin     myalgia   Penicillins Swelling and Rash    Has patient had a PCN reaction causing immediate rash, facial/tongue/throat swelling, SOB or lightheadedness with hypotension: Yes Has patient had a PCN reaction causing severe rash involving mucus membranes or skin necrosis: No Has patient had a PCN reaction that required hospitalization: Yes Has patient had a PCN reaction occurring within the last 10 years: No If all of the above answers are "NO", then may proceed with Cephalosporin use.     Review of Systems  Constitutional:  Positive for malaise/fatigue. Negative for chills and fever.  HENT:  Negative for congestion and hearing loss.   Eyes:  Negative for blurred vision and discharge.  Respiratory:  Negative for cough, sputum production and shortness of breath.   Cardiovascular:  Negative for chest pain, palpitations and leg swelling.  Gastrointestinal:  Negative for abdominal pain, blood in stool, constipation, diarrhea, heartburn, nausea and vomiting.  Genitourinary:  Negative for dysuria, frequency, hematuria and urgency.  Musculoskeletal:  Positive for back pain, joint pain, myalgias and neck pain. Negative for falls.  Skin:  Negative for rash.  Neurological:  Negative for dizziness, sensory change, loss of consciousness, weakness and headaches.  Endo/Heme/Allergies:  Negative for environmental allergies. Does not bruise/bleed easily.  Psychiatric/Behavioral:  Negative for depression and suicidal ideas. The patient is not nervous/anxious and does not have insomnia.        Objective:    Physical Exam Vitals reviewed.  Constitutional:      General: He is  not in acute distress.    Appearance: Normal appearance. He is not ill-appearing or diaphoretic.  HENT:     Head: Normocephalic and atraumatic.     Right Ear: Tympanic membrane, ear canal and external ear normal. There is no impacted cerumen.     Left Ear: Tympanic membrane, ear canal and external ear normal. There is no impacted cerumen.     Nose: Nose normal. No rhinorrhea.     Mouth/Throat:     Pharynx: Oropharynx is clear.  Eyes:     General: No scleral icterus.    Extraocular Movements: Extraocular movements intact.     Conjunctiva/sclera: Conjunctivae normal.     Pupils: Pupils are equal, round, and reactive to light.  Neck:     Thyroid: No thyroid mass or thyroid tenderness.  Cardiovascular:     Rate and Rhythm: Normal rate and regular rhythm.     Pulses: Normal pulses.     Heart sounds: Normal heart sounds. No murmur heard. Pulmonary:     Effort: Pulmonary effort is normal.     Breath sounds: Normal breath sounds. No wheezing.  Abdominal:     General: Bowel sounds are normal.     Palpations: Abdomen is soft. There is no mass.     Tenderness: There is no guarding.  Musculoskeletal:        General: No swelling. Normal range of motion.     Cervical back: Normal range of motion and neck supple. No rigidity.     Right lower leg: No edema.     Left lower leg: No edema.  Lymphadenopathy:     Cervical: No cervical adenopathy.  Skin:    General: Skin is warm and dry.     Findings: No rash.  Neurological:     General: No focal deficit present.     Mental Status: He is alert and oriented to person, place, and time.  Cranial Nerves: No cranial nerve deficit.     Deep Tendon Reflexes: Reflexes normal.  Psychiatric:        Mood and Affect: Mood normal.        Behavior: Behavior normal.     BP 130/80 (BP Location: Left Arm, Patient Position: Sitting, Cuff Size: Large)   Pulse (!) 59   Temp 98 F (36.7 C) (Oral)   Resp 16   Ht 5\' 9"  (1.753 m)   Wt 199 lb 9.6 oz (90.5  kg)   SpO2 96%   BMI 29.48 kg/m  Wt Readings from Last 3 Encounters:  11/13/23 199 lb 9.6 oz (90.5 kg)  05/06/23 198 lb (89.8 kg)  01/21/23 203 lb (92.1 kg)    Diabetic Foot Exam - Simple   No data filed    Lab Results  Component Value Date   WBC 8.6 11/14/2023   HGB 14.6 11/14/2023   HCT 42.5 11/14/2023   PLT 249 11/14/2023   GLUCOSE 114 (H) 11/14/2023   CHOL 209 (H) 11/14/2023   TRIG 621 (H) 11/14/2023   HDL 34 (L) 11/14/2023   LDLDIRECT 55.0 05/06/2023   LDLCALC  11/14/2023     Comment:     . LDL cholesterol not calculated. Triglyceride levels greater than 400 mg/dL invalidate calculated LDL results. . Reference range: <100 . Desirable range <100 mg/dL for primary prevention;   <70 mg/dL for patients with CHD or diabetic patients  with > or = 2 CHD risk factors. Marland Kitchen LDL-C is now calculated using the Martin-Hopkins  calculation, which is a validated novel method providing  better accuracy than the Friedewald equation in the  estimation of LDL-C.  Horald Pollen et al. Lenox Ahr. 1610;960(45): 2061-2068  (http://education.[facility].com/faq/FAQ164)    ALT 24 11/14/2023   AST 24 11/14/2023   NA 140 11/14/2023   K 4.9 11/14/2023   CL 101 11/14/2023   CREATININE 0.93 11/14/2023   BUN 15 11/14/2023   CO2 32 11/14/2023   TSH 3.13 11/14/2023   PSA 0.43 11/14/2023   INR 0.97 07/02/2017   HGBA1C 6.2 05/06/2023   MICROALBUR 0.7 08/25/2018    Lab Results  Component Value Date   TSH 3.13 11/14/2023   Lab Results  Component Value Date   WBC 8.6 11/14/2023   HGB 14.6 11/14/2023   HCT 42.5 11/14/2023   MCV 88.2 11/14/2023   PLT 249 11/14/2023   Lab Results  Component Value Date   NA 140 11/14/2023   K 4.9 11/14/2023   CO2 32 11/14/2023   GLUCOSE 114 (H) 11/14/2023   BUN 15 11/14/2023   CREATININE 0.93 11/14/2023   BILITOT 0.4 11/14/2023   ALKPHOS 70 05/06/2023   AST 24 11/14/2023   ALT 24 11/14/2023   PROT 7.0 11/14/2023   ALBUMIN 4.7 05/06/2023    CALCIUM 10.1 11/14/2023   ANIONGAP 7 07/03/2017   GFR 97.50 05/06/2023   Lab Results  Component Value Date   CHOL 209 (H) 11/14/2023   Lab Results  Component Value Date   HDL 34 (L) 11/14/2023   Lab Results  Component Value Date   [facility]  11/14/2023     Comment:     . LDL cholesterol not calculated. Triglyceride levels greater than 400 mg/dL invalidate calculated LDL results. . Reference range: <100 . Desirable range <100 mg/dL for primary prevention;   <70 mg/dL for patients with CHD or diabetic patients  with > or = 2 CHD risk factors. Marland Kitchen LDL-C is now calculated using the Martin-Hopkins  calculation, which is a validated novel method providing  better accuracy than the Friedewald equation in the  estimation of LDL-C.  Horald Pollen et al. Lenox Ahr. 4098;119(14): 2061-2068  (http://education.[facility].com/faq/FAQ164)    Lab Results  Component Value Date   TRIG 621 (H) 11/14/2023   Lab Results  Component Value Date   CHOLHDL 6.1 (H) 11/14/2023   Lab Results  Component Value Date   HGBA1C 6.2 05/06/2023       Assessment & Plan:  Essential hypertension Assessment & Plan: Well controlled, no changes to meds. Encouraged heart healthy diet such as the DASH diet and exercise as tolerated.    Orders: -     CBC with Differential/Platelet; Future -     Comprehensive metabolic panel; Future -     TSH; Future  Coronary artery disease involving native coronary artery of native heart with angina pectoris [facility]) Assessment & Plan: Asymptomatic. Continue medical management   Hyperglycemia Assessment & Plan: hgba1c acceptable, minimize simple carbs. Increase exercise as tolerated.    Chronic thoracic back pain, unspecified back pain laterality -     Drug Monitoring Panel [phone] , Urine; Future  Anxiety and depression -     Drug Monitoring Panel [id number] , Urine; Future -     TSH; Future -     ALPRAZolam; Take 1 tablet (1 mg total) by mouth 2 (two) times daily as  needed for anxiety.  Dispense: 60 tablet; Refill: 0  Mixed hyperlipidemia -     Lipid panel; Future  Hypotestosteronemia -     Testosterone; Future  Arthralgia of both hands -     Rheumatoid factor; Future -     ANA; Future -     Sedimentation rate; Future -     Uric acid; Future  Hypogonadism in male -     Testosterone; PLACE 4 PUMPS ONTO THE SKIN DAILY  Dispense: 150 g; Refill: 0  Preventative health care Assessment & Plan: Patient encouraged to maintain heart healthy diet, regular exercise, adequate sleep. Consider daily probiotics. Take medications as prescribed. Given and reviewed copy of ACP documents from [employer] and encouraged to complete and return. Labs ordered and reviewed   Colonoscopy 2023, repeat in 2028  Orders: -     PSA; Future  Bilateral hand pain -     Ambulatory referral to Orthopedic Surgery  Pain in both knees, unspecified chronicity -     Ambulatory referral to Orthopedic Surgery  High risk medication use -     ALPRAZolam; Take 1 tablet (1 mg total) by mouth 2 (two) times daily as needed for anxiety.  Dispense: 60 tablet; Refill: 0  Other orders -     DM TEMPLATE    Assessment and Plan    Hyperlipidemia Patient stopped Rosuvastatin due to leg pain. High risk for cardiovascular events due to elevated cholesterol levels and history of injuries. Discussed the option of PCSK9 inhibitors at a hyperlipidemia clinic. Patient has been taking fish oil and Super Beats. -Check lipid panel today. -Consider referral to hyperlipidemia clinic if lipid panel remains elevated.  Hypogonadism Patient reports inconsistent efficacy of testosterone gel at 4 pumps daily. Reports lack of energy. -Check testosterone levels today. -Continue testosterone gel and follow up with urologist on January 6th.  Arthritis Patient reports worsening pain in hands and knees, with significant impact on daily activities. Family history of rheumatoid arthritis. No  relief from over-the-counter treatments. -Check rheumatoid factor, sed rate, ANA, and uric acid today. -Referral to orthopedics  for evaluation of bilateral knee pain and wrist pain. -Consider referral to rheumatology if blood work suggests rheumatoid arthritis.  General Health Maintenance -Continue Alprazolam as needed for sleep. -Repeat colonoscopy in 2028 as per previous recommendation. -Check PSA today for prostate health monitoring. -Follow up in 6 months or sooner if blood work results warrant further action.         Danise Edge, MD

## 2023-11-21 ENCOUNTER — Other Ambulatory Visit: Payer: Self-pay | Admitting: Family Medicine

## 2023-11-21 DIAGNOSIS — M170 Bilateral primary osteoarthritis of knee: Secondary | ICD-10-CM

## 2023-11-21 DIAGNOSIS — Z79899 Other long term (current) drug therapy: Secondary | ICD-10-CM

## 2023-11-21 DIAGNOSIS — G8929 Other chronic pain: Secondary | ICD-10-CM

## 2023-11-21 MED ORDER — HYDROCODONE-ACETAMINOPHEN 10-325 MG PO TABS: 1.0000 | ORAL_TABLET | Freq: Three times a day (TID) | ORAL | 0 refills | Status: DC | PRN

## 2023-11-21 NOTE — Telephone Encounter (Signed)
Copied from CRM [id number]. Topic: Clinical - Medication Refill >> Nov 21, 2023  8:38 AM Gaetano Hawthorne wrote: Most Recent Primary Care Visit:  Provider: [facility]  Department[facility]  Visit Type: LAB  Date: 11/14/2023  Medication: HYDROcodone-acetaminophen (NORCO) 10-325 MG tablet  Has the patient contacted their pharmacy? No, patient is used to call in and requesting through office - pharmacy is a bit disorganized. (Agent: If no, request that the patient contact the pharmacy for the refill. If patient does not wish to contact the pharmacy document the reason why and proceed with request.) (Agent: If yes, when and what did the pharmacy advise?)  Is this the correct pharmacy for this prescription? Yes If no, delete pharmacy and type the correct one.  This is the patient's preferred pharmacy:  [facility] [address] [place] - [address] [place] [address] Phone: [phone] Fax: [phone]    Has the prescription been filled recently? No  Is the patient out of the medication? No  Has the patient been seen for an appointment in the last year OR does the patient have an upcoming appointment? Yes  Can we respond through MyChart? Yes  Agent: Please be advised that Rx refills may take up to 3 business days. We ask that you follow-up with your pharmacy.

## 2023-12-07 NOTE — Progress Notes (Signed)
 Chief Complaint: No chief complaint on file.   History of Present Illness:  Elijah Marquez is a 53 y.o. male who is seen in consultation from Domenica Harlene LABOR, MD for evaluation of hypogonadism as well as low energy level.  The patient has been on testosterone  repletion with transdermal preparations for about 10 years.  He has had a difficult time having physiologic levels despite being on 4 pumps of AndroGel  a day.   Last testosterone  level was 120.  He has had a low/normal PSA and most recent hematocrit was 42.5.  The patient states that he does use 4 pumps daily.  He does have mild erectile dysfunction as well.  Past Medical History:  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   B/l legs   Neck pain on left side 08/23/2012   Osteoarthritis    all over (07/02/2017)   Osteoarthritis of both knees    Tobacco abuse     Past Surgical History:  Past Surgical History:  Procedure Laterality Date   ANTERIOR CERVICAL DECOMP/DISCECTOMY FUSION  12/08/2012   C4,5,,6   BACK SURGERY     CORONARY ANGIOPLASTY WITH STENT PLACEMENT  07/02/2017   1 stent   CORONARY STENT INTERVENTION N/A 07/02/2017   Procedure: CORONARY STENT INTERVENTION;  Surgeon: Burnard Debby LABOR, MD;  Location: [facility] INVASIVE CV LAB;  Service: Cardiovascular;  Laterality: N/A;  LAD/DIAG   KNEE ARTHROSCOPY Bilateral 2011-2012   left-right   LEFT HEART CATH AND CORONARY ANGIOGRAPHY N/A 07/02/2017   Procedure: Left Heart Cath and Coronary Angiography;  Surgeon: Burnard Debby LABOR, MD;  Location: [facility] INVASIVE CV LAB;  Service: Cardiovascular;  Laterality: N/A;   MULTIPLE TOOTH EXTRACTIONS     WISDOM  TOOTH EXTRACTION      Allergies:  Allergies  Allergen Reactions   Atorvastatin  Other (See Comments)    Caused leg pain   Rosuvastatin      myalgia   Penicillins Swelling and Rash    Has patient had a PCN reaction causing immediate rash, facial/tongue/throat swelling, SOB or lightheadedness with hypotension: Yes Has patient had a PCN reaction causing severe rash involving mucus membranes or skin necrosis: No Has patient had a PCN reaction that required hospitalization: Yes Has patient had a PCN reaction occurring within the last 10 years: No If all of the above answers are NO, then may proceed with Cephalosporin use.     Family History:  Family History  Problem Relation Age of Onset   Fibromyalgia Mother    Coronary artery disease Mother    Hypertension Mother    Heart attack Mother    Heart disease Mother        CAD   Peripheral vascular disease Mother 15       hemorrhage from vascular procedure   Alcohol abuse Father    Fibromyalgia Sister    Hypertension Sister    Heart disease Sister    Lupus Sister    Emphysema Sister        smoker   Kidney disease Daughter    Other Son        mildly mentally handicap  Seizures Son    Colon cancer Neg Hx    Colon polyps Neg Hx    Crohn's disease Neg Hx    Esophageal cancer Neg Hx    Rectal cancer Neg Hx    Stomach cancer Neg Hx    Ulcerative colitis Neg Hx     Social History:  Social History   Tobacco Use   Smoking status: Former    Current packs/day: 0.00    Average packs/day: 1.5 packs/day for 35.0 years (52.5 ttl pk-yrs)    Types: Cigarettes    Start date: 07/30/1980    Quit date: 07/31/2015    Years since quitting: 8.3    Passive exposure: Never   Smokeless tobacco: Never  Vaping Use   Vaping status: Former   Quit date: 06/16/2017  Substance Use Topics   Alcohol use: No   Drug use: No    Review of symptoms:  Constitutional:  Negative for unexplained weight loss, night sweats, fever, chills ENT:  Negative  for nose bleeds, sinus pain, painful swallowing CV:  Negative for chest pain, shortness of breath, exercise intolerance, palpitations, loss of consciousness Resp:  Negative for cough, wheezing, shortness of breath GI:  Negative for nausea, vomiting, diarrhea, bloody stools GU:  Positives noted in HPI; otherwise negative for gross hematuria, dysuria, urinary incontinence Neuro:  Negative for seizures, poor balance, limb weakness, slurred speech Psych:  Negative for lack of energy, depression, anxiety Endocrine:  Negative for polydipsia, polyuria, symptoms of hypoglycemia (dizziness, hunger, sweating) Hematologic:  Negative for anemia, purpura, petechia, prolonged or excessive bleeding, use of anticoagulants  Allergic:  Negative for difficulty breathing or choking as a result of exposure to anything; no shellfish allergy; no allergic response (rash/itch) to materials, foods  Physical exam: There were no vitals taken for this visit. GENERAL APPEARANCE:  Well appearing, well developed, well nourished, NAD HEENT: Atraumatic, Normocephalic. NECK: Normal appearance LUNGS: Normal inspiratory and expiratory excursion HEART: Regular Rate ABDOMEN: There are no inguinal hernias.  Mild obesity GU: Phallus normal, no lesions. Scrotal skin normal. Testicles/epididymal structures normal. Meatus normal. Normal anal sphincter tone, prostate 30 mL, symmetric, non nodular, non tender. EXTREMITIES: Moves all extremities well.  Without clubbing, cyanosis, or edema. NEUROLOGIC:  Alert and oriented x 3, normal gait, CN II-XII grossly intact.  MENTAL STATUS:  Appropriate. SKIN:  Warm, dry and intact.    Results: No results found for this or any previous visit (from the past 24 hours).  I have reviewed referring/prior physicians notes  I have reviewed most recent urine/blood tox studies.  Positive for hydrocodone  and hydromorphone as well as benzodiazepines.  I have reviewed PSA results   Assessment: Low  testosterone  level.  Long-term repletion with transdermal preparations and effective due to intermittent compliance as well as (possible) poor absorption  ED, organic   Plan: -I discussed switching over to injectable testosterone  cypionate.  I think this is better for both compliance as well as steady blood levels  -I will have him come back in a week or 2 for nurse visit to have his wife learn injections of testosterone  100 mg IM weekly  -I did discuss exercise and weight loss/proper diet  -I will see him back in about 2 months following laboratories

## 2023-12-08 ENCOUNTER — Ambulatory Visit (INDEPENDENT_AMBULATORY_CARE_PROVIDER_SITE_OTHER): Payer: 59 | Admitting: Urology

## 2023-12-08 VITALS — BP 129/82 | HR 56 | Ht 69.0 in | Wt 200.0 lb

## 2023-12-08 DIAGNOSIS — R7989 Other specified abnormal findings of blood chemistry: Secondary | ICD-10-CM

## 2023-12-08 DIAGNOSIS — N5201 Erectile dysfunction due to arterial insufficiency: Secondary | ICD-10-CM

## 2023-12-08 MED ORDER — SILDENAFIL CITRATE 100 MG PO TABS: ORAL_TABLET | ORAL | 99 refills | Status: AC

## 2023-12-08 MED ORDER — TESTOSTERONE CYPIONATE 200 MG/ML IM SOLN: INTRAMUSCULAR | 1 refills | Status: DC

## 2023-12-16 ENCOUNTER — Other Ambulatory Visit: Payer: Self-pay | Admitting: Family

## 2023-12-21 ENCOUNTER — Other Ambulatory Visit: Payer: Self-pay | Admitting: Family Medicine

## 2023-12-21 DIAGNOSIS — M170 Bilateral primary osteoarthritis of knee: Secondary | ICD-10-CM

## 2023-12-21 DIAGNOSIS — G8929 Other chronic pain: Secondary | ICD-10-CM

## 2023-12-21 DIAGNOSIS — Z79899 Other long term (current) drug therapy: Secondary | ICD-10-CM

## 2023-12-21 DIAGNOSIS — F419 Anxiety disorder, unspecified: Secondary | ICD-10-CM

## 2023-12-22 ENCOUNTER — Ambulatory Visit (INDEPENDENT_AMBULATORY_CARE_PROVIDER_SITE_OTHER): Payer: 59 | Admitting: Urology

## 2023-12-22 DIAGNOSIS — R7989 Other specified abnormal findings of blood chemistry: Secondary | ICD-10-CM | POA: Diagnosis not present

## 2023-12-22 MED ORDER — HYDROCODONE-ACETAMINOPHEN 10-325 MG PO TABS: 1.0000 | ORAL_TABLET | Freq: Three times a day (TID) | ORAL | 0 refills | Status: DC | PRN

## 2023-12-22 MED ORDER — TESTOSTERONE CYPIONATE 200 MG/ML IM SOLN
100.0000 mg | Freq: Once | INTRAMUSCULAR | Status: AC
  Administered 2023-12-22: 100 mg via INTRAMUSCULAR

## 2023-12-22 MED ORDER — ALPRAZOLAM 1 MG PO TABS: 1.0000 mg | ORAL_TABLET | Freq: Two times a day (BID) | ORAL | 0 refills | Status: DC | PRN

## 2023-12-22 NOTE — Telephone Encounter (Signed)
Requesting: Xanax 1mg  Contract: 12/26/2022 UDS: 11/13/2023 Last Visit: 11/13/2023 Next Visit: 03/18/2024 Last Refill: 11/13/2023 #60 no refills   Please Advise

## 2023-12-22 NOTE — Patient Instructions (Addendum)
Elijah Marquez is a 53 y.o.  male with testosterone deficiency who presents today for instruction on delivering an IM injection of testosterone cypionate.    I instructed the patient to identify the concentration of his testosterone. Testosterone for injection is usually in the form of testosterone cypionate. These liquids come in multiple concentrations, so before giving an injection, it's very important to make sure that his intended dosage takes into account the concentration of the testosterone serum. Usually, testosterone comes in a concentration of either 100 mg/ml or 200 mg/ml.  We typically use the 200 mg/mL in this office.    Using a sterile, 18 G needle and 3 cc syringe, the testosterone cypionate was drawn up for a 0.5  cc injection.  To draw up the dose, I demonstrated how to first draw air into the syringe equal to the volume of the dosage. Then, wipe the top of the medication bottle with an alcohol wipe, insert the needle through the lid and into the medication, and push the air from your syringe into the bottle. Turn the bottle upside down and draw out the exact dosage of testosterone.  I demonstrated how to aspirate the syringe by hinge the syringe with its needle uncapped and pointing up in front of him.  Looking for air bubbles in the syringe. Flick the side of the syringe to get these bubbles to rise to the top.    When the dosage is bubble-free, I slowly depressed the plunger to force the air at the top of the syringe out stopping when a tiny drop of medication comes out of the tip of the syringe.  I advised him to be certain no air remained in the syringe as injecting air is very dangerous.  Being careful not to squirt or spray a significant portion of the dosage onto the floor.  Preparing the injection site, outer middle third of the vastus lateralis muscle of the thigh, I took a sterile alcohol pad and wipe the immediate area around where I intended him to inject.   I then  demonstrated how to change the needle from the 18 G to the 21 G needle.  I then gave him the syringe for injecting.  He correctly identified the injection location.  He held the syringe like a dart at a 90-degree angle above the sterile injection site. Quickly plunged it into the flesh. Before depressing the plunger, he drew back on it slightly and no blood was seen.  I advised him that if blood flashed in the syringe, he would need to remove the needle and then select a different location as he was in the vein.  Inject the medication at a steady, controlled pace.  He fully depressed the plunger, slowly pull the needle out. Pressing around the injection site with a sterile cotton swab as he did so - this preventing the emerging needle from pulling on the skin and causing extra pain.  We assessed the needle entry point for bleeding, and applied a sterile Band-Aid and/or cotton swab if needed. Disposed of the used needle and syringe in a proper sharps container.  I advised him to acquire a sharps container for his personal use at home.  I advised him that If, after injection, he experienced redness, swelling, or discomfort beyond that of normal soreness at the site of injection, call our office for an appointment and instructions.  He is to always store his medication at the recommended temperature, and always check the expiration date on  the bottle. If it's expired, don't use it.  Of course, keep all of med's out of reach of children.  Do not change his dose without consulting your provider.  His starting dose will be 0.5 cc every week.  He will return 2 month after his testosterone level.

## 2023-12-22 NOTE — Telephone Encounter (Signed)
Requesting: hydrocodone 10-325mg  Contract: 11/13/23 UDS: 11/13/23 Last Visit: 11/13/23 Next Visit: 03/18/24 Last Refill: 11/21/23 #90 and 0RF   Please Advise

## 2024-01-21 ENCOUNTER — Other Ambulatory Visit: Payer: Self-pay | Admitting: Family Medicine

## 2024-01-21 DIAGNOSIS — M170 Bilateral primary osteoarthritis of knee: Secondary | ICD-10-CM

## 2024-01-21 DIAGNOSIS — Z79899 Other long term (current) drug therapy: Secondary | ICD-10-CM

## 2024-01-21 DIAGNOSIS — G8929 Other chronic pain: Secondary | ICD-10-CM

## 2024-01-21 MED ORDER — HYDROCODONE-ACETAMINOPHEN 10-325 MG PO TABS: 1.0000 | ORAL_TABLET | Freq: Three times a day (TID) | ORAL | 0 refills | Status: DC | PRN

## 2024-01-21 NOTE — Telephone Encounter (Signed)
Requesting: NORCO Contract:11/13/23 UDS:11/13/23 Last Visit:11/13/23 Next Visit:n/a Last Refill:12/22/23  Please Advise

## 2024-02-08 NOTE — Progress Notes (Signed)
 Chief Complaint: No chief complaint on file.   History of Present Illness:  Elijah Marquez is a 53 y.o. male who is seen in consultation from Bradd Canary, MD for evaluation of hypogonadism as well as low energy level.  The patient has been on testosterone repletion with transdermal preparations for about 10 years.  He has had a difficult time having physiologic levels despite being on 4 pumps of AndroGel a day.  His last testosterone level before starting on injections was 120.  3.10.2025: Here today for recheck.  He has been on testosterone cypionate administered intramuscular at home weekly-he injects on Monday evenings.  He has not injected yet today.  He states that his energy level is still low.  He has not had recent laboratories.  Past Medical History:  Past Medical History:  Diagnosis Date   Anxiety 08/23/2012   Anxiety as acute reaction to exceptional stress 09/20/2012   Chicken pox as a child   Chronic lower back pain    Fatigue    History of tobacco abuse    Failed Chantix and wellbutrin    Hyperlipidemia 08/23/2012   Hypertension    Hypogonadism in male 10/02/2014   Intermittent claudication (HCC) 08/13/2015   B/l legs   Neck pain on left side 08/23/2012   Osteoarthritis    "all over" (07/02/2017)   Osteoarthritis of both knees    Tobacco abuse     Past Surgical History:  Past Surgical History:  Procedure Laterality Date   ANTERIOR CERVICAL DECOMP/DISCECTOMY FUSION  12/08/2012   C4,5,,6   BACK SURGERY     CORONARY ANGIOPLASTY WITH STENT PLACEMENT  07/02/2017   "1 stent"   CORONARY STENT INTERVENTION N/A 07/02/2017   Procedure: CORONARY STENT INTERVENTION;  Surgeon: Lennette Bihari, MD;  Location: [facility] IN[facility]ASIVE [facility];  Service: Cardiovascular;  Laterality: N/A;  LAD/DIAG   KNEE ARTHROSCOPY Bilateral 2011-2012   left-right   LEFT HEART CATH AND CORONARY ANGIOGRAPHY N/A 07/02/2017   Procedure: Left Heart Cath and Coronary Angiography;  Surgeon: Lennette Bihari, MD;  Location: [facility] INVASIVE CV LAB;  Service: Cardiovascular;  Laterality: N/A;   MULTIPLE TOOTH EXTRACTIONS     WISDOM TOOTH EXTRACTION      Allergies:  Allergies  Allergen Reactions   Atorvastatin Other (See Comments)    Caused leg pain   Rosuvastatin     myalgia   Penicillins Swelling and Rash    Has patient had a PCN reaction causing immediate rash, facial/tongue/throat swelling, SOB or lightheadedness with hypotension: Yes Has patient had a PCN reaction causing severe rash involving mucus membranes or skin necrosis: No Has patient had a PCN reaction that required hospitalization: Yes Has patient had a PCN reaction occurring within the last 10 years: No If all of the above answers are "NO", then may proceed with Cephalosporin use.     Family History:  Family History  Problem Relation Age of Onset   Fibromyalgia Mother    Coronary artery disease Mother    Hypertension Mother    Heart attack Mother    Heart disease Mother        CAD   Peripheral vascular disease Mother 45       hemorrhage from vascular procedure   Alcohol abuse Father    Fibromyalgia Sister    Hypertension Sister    Heart disease Sister    Lupus Sister    Emphysema Sister        smoker   Kidney  disease Daughter    Other Son        mildly mentally handicap   Seizures Son    Colon cancer Neg Hx    Colon polyps Neg Hx    Crohn's disease Neg Hx    Esophageal cancer Neg Hx    Rectal cancer Neg Hx    Stomach cancer Neg Hx    Ulcerative colitis Neg Hx     Social History:  Social History   Tobacco Use   Smoking status: Former    Current packs/day: 0.00    Average packs/day: 1.5 packs/day for 35.0 years (52.5 ttl pk-yrs)    Types: Cigarettes    Start date: 07/30/1980    Quit date: 07/31/2015    Years since quitting: 8.5    Passive exposure: Never   Smokeless tobacco: Never  Vaping Use   Vaping status: Former   Quit date: 06/16/2017  Substance Use Topics   Alcohol use: No   Drug  use: No    Review of symptoms:  Constitutional:  Negative for unexplained weight loss, night sweats, fever, chills ENT:  Negative for nose bleeds, sinus pain, painful swallowing CV:  Negative for chest pain, shortness of breath, exercise intolerance, palpitations, loss of consciousness Resp:  Negative for cough, wheezing, shortness of breath GI:  Negative for nausea, vomiting, diarrhea, bloody stools GU:  Positives noted in HPI; otherwise negative for gross hematuria, dysuria, urinary incontinence Neuro:  Negative for seizures, poor balance, limb weakness, slurred speech Psych:  Negative for lack of energy, depression, anxiety Endocrine:  Negative for polydipsia, polyuria, symptoms of hypoglycemia (dizziness, hunger, sweating) Hematologic:  Negative for anemia, purpura, petechia, prolonged or excessive bleeding, use of anticoagulants  Allergic:  Negative for difficulty breathing or choking as a result of exposure to anything; no shellfish allergy; no allergic response (rash/itch) to materials, foods  Physical exam: There were no vitals taken for this visit. GENERAL APPEARANCE:  Well appearing, well developed, well nourished, NAD HEENT: Atraumatic, Normocephalic. NECK: Normal appearance LUNGS: Normal inspiratory and expiratory excursion HEART: Regular Rate MENTAL STATUS:  Appropriate. SKIN:  Warm, dry and intact.    Results: No results found for this or any previous visit (from the past 24 hours).  I have reviewed referring/prior physicians notes  I have reviewed most recent urine/blood tox studies.  Positive for hydrocodone and hydromorphone   I have reviewed PSA results--0.43.   Assessment: Low testosterone level.  Now on IM testosterone cypionate 100 mg IM weekly.  ED, organic   Plan: -Trough level of testosterone drawn today-Free and total  -I would like to have him come in either tomorrow/Wednesday of this week or next week for peak levels, notify of  results  -Office visit in 6 months following hemoglobin, hematocrit, testosterone, PSA levels

## 2024-02-09 ENCOUNTER — Ambulatory Visit (INDEPENDENT_AMBULATORY_CARE_PROVIDER_SITE_OTHER): Payer: 59 | Admitting: Urology

## 2024-02-09 ENCOUNTER — Encounter: Payer: Self-pay | Admitting: Urology

## 2024-02-09 VITALS — BP 172/100 | HR 57

## 2024-02-09 DIAGNOSIS — R7989 Other specified abnormal findings of blood chemistry: Secondary | ICD-10-CM | POA: Diagnosis not present

## 2024-02-09 DIAGNOSIS — N5201 Erectile dysfunction due to arterial insufficiency: Secondary | ICD-10-CM | POA: Diagnosis not present

## 2024-02-11 ENCOUNTER — Other Ambulatory Visit: Payer: Self-pay

## 2024-02-11 ENCOUNTER — Other Ambulatory Visit

## 2024-02-11 DIAGNOSIS — R7989 Other specified abnormal findings of blood chemistry: Secondary | ICD-10-CM

## 2024-02-17 ENCOUNTER — Other Ambulatory Visit: Payer: Self-pay | Admitting: Family Medicine

## 2024-02-17 DIAGNOSIS — F419 Anxiety disorder, unspecified: Secondary | ICD-10-CM

## 2024-02-17 DIAGNOSIS — Z79899 Other long term (current) drug therapy: Secondary | ICD-10-CM

## 2024-02-17 DIAGNOSIS — G8929 Other chronic pain: Secondary | ICD-10-CM

## 2024-02-17 DIAGNOSIS — M170 Bilateral primary osteoarthritis of knee: Secondary | ICD-10-CM

## 2024-02-17 LAB — TESTOSTERONE,FREE AND TOTAL
Testosterone, Free: 22.3 pg/mL (ref 7.2–24.0)
Testosterone: 881 ng/dL (ref 264–916)

## 2024-02-17 LAB — HEMATOCRIT: Hematocrit: 49.2 % (ref 37.5–51.0)

## 2024-02-17 LAB — PSA: Prostate Specific Ag, Serum: 0.6 ng/mL (ref 0.0–4.0)

## 2024-02-17 LAB — HEMOGLOBIN: Hemoglobin: 16.8 g/dL (ref 13.0–17.7)

## 2024-02-17 MED ORDER — ALPRAZOLAM 1 MG PO TABS: 1.0000 mg | ORAL_TABLET | Freq: Two times a day (BID) | ORAL | 0 refills | Status: DC | PRN

## 2024-02-17 MED ORDER — HYDROCODONE-ACETAMINOPHEN 10-325 MG PO TABS
1.0000 | ORAL_TABLET | Freq: Three times a day (TID) | ORAL | 0 refills | Status: DC | PRN
Start: 2024-02-17 — End: 2024-04-19

## 2024-02-20 ENCOUNTER — Encounter: Payer: Self-pay | Admitting: Urology

## 2024-03-18 ENCOUNTER — Ambulatory Visit: Payer: 59 | Admitting: Family Medicine

## 2024-04-19 ENCOUNTER — Other Ambulatory Visit: Payer: Self-pay | Admitting: Family Medicine

## 2024-04-19 DIAGNOSIS — Z79899 Other long term (current) drug therapy: Secondary | ICD-10-CM

## 2024-04-19 DIAGNOSIS — M170 Bilateral primary osteoarthritis of knee: Secondary | ICD-10-CM

## 2024-04-19 DIAGNOSIS — G8929 Other chronic pain: Secondary | ICD-10-CM

## 2024-04-19 MED ORDER — HYDROCODONE-ACETAMINOPHEN 10-325 MG PO TABS: 1.0000 | ORAL_TABLET | Freq: Three times a day (TID) | ORAL | 0 refills | Status: DC | PRN

## 2024-04-19 NOTE — Telephone Encounter (Signed)
 Copied from CRM [id number]. Topic: Clinical - Medication Refill >> Apr 19, 2024  8:19 AM Ary Bitter R wrote: Medication: HYDROcodone -acetaminophen  (NORCO) 10-325 MG tablet  Has the patient contacted their pharmacy? Yes, has no more fills.  This is the patient's preferred pharmacy:  [facility] [address] [place] - [address] [place] [address] Phone: [phone] Fax: [phone]  Is this the correct pharmacy for this prescription? Yes If no, delete pharmacy and type the correct one.   Has the prescription been filled recently? Yes  Is the patient out of the medication? Yes, but only has one tablet left  Has the patient been seen for an appointment in the last year OR does the patient have an upcoming appointment? Yes  Can we respond through MyChart? Yes  Agent: Please be advised that Rx refills may take up to 3 business days. We ask that you follow-up with your pharmacy.

## 2024-05-12 ENCOUNTER — Ambulatory Visit

## 2024-05-12 NOTE — Progress Notes (Signed)
 Elijah Marquez

## 2024-05-20 ENCOUNTER — Telehealth: Payer: Self-pay | Admitting: Family Medicine

## 2024-05-20 DIAGNOSIS — G8929 Other chronic pain: Secondary | ICD-10-CM

## 2024-05-20 DIAGNOSIS — F419 Anxiety disorder, unspecified: Secondary | ICD-10-CM

## 2024-05-20 DIAGNOSIS — Z79899 Other long term (current) drug therapy: Secondary | ICD-10-CM

## 2024-05-20 DIAGNOSIS — F32A Depression, unspecified: Secondary | ICD-10-CM

## 2024-05-20 DIAGNOSIS — M170 Bilateral primary osteoarthritis of knee: Secondary | ICD-10-CM

## 2024-05-20 NOTE — Telephone Encounter (Signed)
 Needs appt

## 2024-05-20 NOTE — Telephone Encounter (Signed)
 Copied from CRM [id number]. Topic: Clinical - Medication Refill >> May 20, 2024  9:48 AM Trula Gable C wrote: Medication: HYDROcodone -acetaminophen  (NORCO) 10-325 MG tablet  ALPRAZolam  (XANAX ) 1 MG tablet   Has the patient contacted their pharmacy? Yes (Agent: If no, request that the patient contact the pharmacy for the refill. If patient does not wish to contact the pharmacy document the reason why and proceed with request.) (Agent: If yes, when and what did the pharmacy advise?)  This is the patient's preferred pharmacy:  [facility] [address], [place] - [address] [place] [address] Phone: [phone] Fax: [phone]    Is this the correct pharmacy for this prescription? Yes If no, delete pharmacy and type the correct one.   Has the prescription been filled recently? No  Is the patient out of the medication? Yes  Has the patient been seen for an appointment in the last year OR does the patient have an upcoming appointment? Yes  Can we respond through MyChart? Yes  Agent: Please be advised that Rx refills may take up to 3 business days. We ask that you follow-up with your pharmacy.

## 2024-05-21 MED ORDER — HYDROCODONE-ACETAMINOPHEN 10-325 MG PO TABS: 1.0000 | ORAL_TABLET | Freq: Three times a day (TID) | ORAL | 0 refills | Status: DC | PRN

## 2024-05-21 MED ORDER — ALPRAZOLAM 1 MG PO TABS: 1.0000 mg | ORAL_TABLET | Freq: Two times a day (BID) | ORAL | 0 refills | Status: DC | PRN

## 2024-05-21 NOTE — Telephone Encounter (Signed)
 Patient scheduled appointment on 05/24/24 @ 3:20 PM.

## 2024-05-24 ENCOUNTER — Ambulatory Visit: Admitting: Family Medicine

## 2024-05-25 ENCOUNTER — Encounter: Payer: Self-pay | Admitting: Student

## 2024-05-25 ENCOUNTER — Ambulatory Visit (INDEPENDENT_AMBULATORY_CARE_PROVIDER_SITE_OTHER): Admitting: Student

## 2024-05-25 VITALS — BP 128/82 | HR 56 | Temp 97.8°F | Resp 12 | Ht 69.0 in | Wt 200.2 lb

## 2024-05-25 DIAGNOSIS — I10 Essential (primary) hypertension: Secondary | ICD-10-CM

## 2024-05-25 DIAGNOSIS — M546 Pain in thoracic spine: Secondary | ICD-10-CM | POA: Diagnosis not present

## 2024-05-25 DIAGNOSIS — F419 Anxiety disorder, unspecified: Secondary | ICD-10-CM | POA: Diagnosis not present

## 2024-05-25 DIAGNOSIS — Z0000 Encounter for general adult medical examination without abnormal findings: Secondary | ICD-10-CM | POA: Diagnosis not present

## 2024-05-25 DIAGNOSIS — Z87891 Personal history of nicotine dependence: Secondary | ICD-10-CM | POA: Diagnosis not present

## 2024-05-25 DIAGNOSIS — G8929 Other chronic pain: Secondary | ICD-10-CM

## 2024-05-25 DIAGNOSIS — E782 Mixed hyperlipidemia: Secondary | ICD-10-CM

## 2024-05-25 DIAGNOSIS — R739 Hyperglycemia, unspecified: Secondary | ICD-10-CM | POA: Diagnosis not present

## 2024-05-25 NOTE — Assessment & Plan Note (Signed)
Stable with prn use of alprazolam. Continue same.

## 2024-05-25 NOTE — Assessment & Plan Note (Addendum)
 Last hgba1c acceptable, minimize simple carbs. Increase exercise as tolerated.Update labs today.

## 2024-05-25 NOTE — Assessment & Plan Note (Addendum)
 Encouraged moist heat and gentle stretching as tolerated. May try NSAIDs and prescription meds as directed and report if symptoms worsen or seek immediate care. Patient declines orthopedic referral at this time but understands the importance of returning to the clinic if symptoms worsen.

## 2024-05-25 NOTE — Progress Notes (Signed)
 Subjective:     Patient ID: Elijah Marquez, male    DOB: 11-09-1971, 53 y.o.   MRN: [id number]  Chief Complaint  Patient presents with   Follow-up    HPI  Elijah Marquez presents for FU chronic conditions. Currently employed at [employer]; reports having a physically demanding and active job.  Patient acknowledges dietary habits are not optimal but is making efforts to reduce fried foods and incorporate more grilled options.   Hypertension Metoprolol  succinate 25 mg 3 times daily, compliant. Reports BP at home are 120s/80s  Hyperlipidemia Taking fish oil daily and SuperBeets. Stopped taking Rosuvastatin  r/t leg pain.  CAD Aspirin  81 mg, compliant. Nitroglycerin  0.4 mg sublingual-patient denies chest pain and has not had to use nitroglycerin  recently.  Anxiety/depression Alprazolam  1 mg 2 times daily as needed for anxiety.  ED/Hypotestosteronemia-followed by urology Testosterone  IM inj weekly  Chronic back pain Hydrocodone -acetaminophen  (Norco) 10-325 1 tablet by mouth TID prn. Psx- Cervical decompression/fusion 2014. Was  reffered to [facility] prior OV, reports he has not connected with them, was unable to go to referral as he had a change of insurance. Denies acute flares.  Arthralgia bilateral hands -labwork from 11/2023 showed negative sed rate, ANA, rheumatoid factor, and uric acid.   HCM LDCT lungs due-35-year cigarette smoking history at 1.5 PPD.    Patient denies fever, chills, SOB, CP, palpitations, dyspnea, edema, HA, vision changes, N/V/D, abdominal pain, urinary symptoms, rash, weight changes, and recent illness or hospitalizations.   History of Present Illness              Health Maintenance Due  Topic Date Due   Hepatitis B Vaccines (1 of 3 - 19+ 3-dose series) Never done   Lung Cancer Screening  Never done    Past Medical History:  Diagnosis Date   Anxiety 08/23/2012   Anxiety as acute reaction to exceptional stress 09/20/2012   Chicken pox  as a child   Chronic lower back pain    Fatigue    History of tobacco abuse    Failed Chantix  and wellbutrin    Hyperlipidemia 08/23/2012   Hypertension    Hypogonadism in male 10/02/2014   Intermittent claudication (HCC) 08/13/2015   B/l legs   Neck pain on left side 08/23/2012   Osteoarthritis    all over (07/02/2017)   Osteoarthritis of both knees    Tobacco abuse     Past Surgical History:  Procedure Laterality Date   ANTERIOR CERVICAL DECOMP/DISCECTOMY FUSION  12/08/2012   C4,5,,6   BACK SURGERY     CORONARY ANGIOPLASTY WITH STENT PLACEMENT  07/02/2017   1 stent   CORONARY STENT INTERVENTION N/A 07/02/2017   Procedure: CORONARY STENT INTERVENTION;  Surgeon: Burnard Debby LABOR, MD;  Location: [facility] INVASIVE CV LAB;  Service: Cardiovascular;  Laterality: N/A;  LAD/DIAG   KNEE ARTHROSCOPY Bilateral 2011-2012   left-right   LEFT HEART CATH AND CORONARY ANGIOGRAPHY N/A 07/02/2017   Procedure: Left Heart Cath and Coronary Angiography;  Surgeon: Burnard Debby LABOR, MD;  Location: [facility] INVASIVE CV LAB;  Service: Cardiovascular;  Laterality: N/A;   MULTIPLE TOOTH EXTRACTIONS     WISDOM TOOTH EXTRACTION      Family History  Problem Relation Age of Onset   Fibromyalgia Mother    Coronary artery disease Mother    Hypertension Mother    Heart attack Mother    Heart disease Mother        CAD   Peripheral vascular disease Mother 34  hemorrhage from vascular procedure   Alcohol abuse Father    Fibromyalgia Sister    Hypertension Sister    Heart disease Sister    Lupus Sister    Emphysema Sister        smoker   Kidney disease Daughter    Other Son        mildly mentally handicap   Seizures Son    Colon cancer Neg Hx    Colon polyps Neg Hx    Crohn's disease Neg Hx    Esophageal cancer Neg Hx    Rectal cancer Neg Hx    Stomach cancer Neg Hx    Ulcerative colitis Neg Hx     Social History   Socioeconomic History   Marital status: Married    Spouse name: Not on file    Number of children: Not on file   Years of education: Not on file   Highest education level: Not on file  Occupational History   Not on file  Tobacco Use   Smoking status: Former    Current packs/day: 0.00    Average packs/day: 1.5 packs/day for 35.0 years (52.5 ttl pk-yrs)    Types: Cigarettes    Start date: 07/30/1980    Quit date: 07/31/2015    Years since quitting: 8.8    Passive exposure: Never   Smokeless tobacco: Never  Vaping Use   Vaping status: Former   Quit date: 06/16/2017  Substance and Sexual Activity   Alcohol use: No   Drug use: No   Sexual activity: Not Currently    Partners: Female  Other Topics Concern   Not on file  Social History Narrative   Not on file   Social Drivers of Health   Financial Resource Strain: Not on file  Food Insecurity: Not on file  Transportation Needs: Not on file  Physical Activity: Not on file  Stress: Not on file  Social Connections: Unknown (04/13/2022)   [facility]   Social Network    Social Network: Not on file  Intimate Partner Violence: Unknown (03/06/2022)   Received from [facility]   HITS    Physically Hurt: Not on file    Insult or Talk Down To: Not on file    Threaten Physical Harm: Not on file    Scream or Curse: Not on file    Outpatient Medications Prior to Visit  Medication Sig Dispense Refill   ALPRAZolam  (XANAX ) 1 MG tablet Take 1 tablet (1 mg total) by mouth 2 (two) times daily as needed for anxiety. 60 tablet 0   aspirin  EC 81 MG tablet Take 1 tablet (81 mg total) by mouth daily. Swallow whole. 30 tablet 12   HYDROcodone -acetaminophen  (NORCO) 10-325 MG tablet Take 1 tablet by mouth 3 (three) times daily as needed. 90 tablet 0   metoprolol  succinate (TOPROL -XL) 25 MG 24 hr tablet Take 3 tablets (75 mg total) by mouth daily. 270 tablet 1   nitroGLYCERIN  (NITROSTAT ) 0.4 MG SL tablet Place 1 tablet (0.4 mg total) under the tongue every 5 (five) minutes as needed for chest pain. To ER if no  resolution 25 tablet 3   sildenafil  (VIAGRA ) 100 MG tablet 1/2 to 1 tablet p.o. as needed 20 tablet prn   testosterone  cypionate (DEPOTESTOSTERONE CYPIONATE) 200 MG/ML injection Inject 0.5 mL every week 10 mL 1   No facility-administered medications prior to visit.    Allergies  Allergen Reactions   Atorvastatin  Other (See Comments)    Caused  leg pain   Rosuvastatin      myalgia   Penicillins Swelling and Rash    Has patient had a PCN reaction causing immediate rash, facial/tongue/throat swelling, SOB or lightheadedness with hypotension: Yes Has patient had a PCN reaction causing severe rash involving mucus membranes or skin necrosis: No Has patient had a PCN reaction that required hospitalization: Yes Has patient had a PCN reaction occurring within the last 10 years: No If all of the above answers are NO, then may proceed with Cephalosporin use.     ROS   See HPI Objective:    Physical Exam  General: No acute distress. Awake and conversant.  Eyes: Normal conjunctiva, anicteric. Round symmetric pupils.  ENT: Hearing grossly intact. No nasal discharge.  Neck: Neck is supple. No masses or thyromegaly.  Respiratory: CTAB. Respirations are non-labored. No wheezing.  Skin: Warm. No rashes or ulcers.  Psych: Alert and oriented. Cooperative, Appropriate mood and affect, Normal judgment.  CV: RRR. No murmur. No lower extremity edema.  MSK: Normal ambulation. No clubbing or cyanosis.  Neuro:  CN II-XII grossly normal.    BP 128/82 (BP Location: Right Arm, Patient Position: Sitting, Cuff Size: Normal)   Pulse (!) 56   Temp 97.8 F (36.6 C) (Oral)   Resp 12   Ht 5' 9 (1.753 m)   Wt 200 lb 3.2 oz (90.8 kg)   SpO2 94%   BMI 29.56 kg/m  Wt Readings from Last 3 Encounters:  05/25/24 200 lb 3.2 oz (90.8 kg)  12/08/23 200 lb (90.7 kg)  11/13/23 199 lb 9.6 oz (90.5 kg)       Assessment & Plan:   Problem List Items Addressed This Visit     Anxiety   Stable with prn use of  alprazolam . Continue same.         Chronic back pain   Encouraged moist heat and gentle stretching as tolerated. May try NSAIDs and prescription meds as directed and report if symptoms worsen or seek immediate care. Patient declines orthopedic referral at this time but understands the importance of returning to the clinic if symptoms worsen.        Essential hypertension - Primary   Well controlled, no changes to meds. Encouraged heart healthy diet such as the DASH diet and exercise as tolerated.        History of tobacco abuse   Continues to abstain.  He has a 35-year history of smoking.  Quit 2016.  Discussed LDCT screening -patient denies at this time.      Hyperglycemia   Last hgba1c acceptable, minimize simple carbs. Increase exercise as tolerated.Update labs today.      Hyperlipidemia   Recheck lipid panel today. Encourage heart healthy diet such as MIND or DASH diet, increase exercise, avoid trans fats, simple carbohydrates and processed foods, consider a krill or fish or flaxseed oil cap daily.  Was unable to tolerate rosuvastatin  r/t leg pain. High risk for cardiovascular events due to elevated cholesterol levels and PMHX. Discussed  PCSK9 inhibitors options at a hyperlipidemia clinic. Patient has been taking fish oil and Super Beats. Consider referral to hyperlipidemia clinic if still elevated.      General Healthcare Maintenance Patient has 35-year smoking history at 1.5 PPD.  Not currently smoking. Discussed low-dose CT scan of the lungs with patient.  Patient agreeable referral sent for LDCT.   Portions of this note were dictated using DRAGON voice recognition software. Please disregard any errors in transcription.   Patient was  educated on the diagnosis, treatment options, potential risks, benefits, and alternatives. All questions were addressed. Patient verbalized understanding and agrees with the plan of care. Will follow up as advised or sooner if symptoms worsen or  new concerns arise.   I am having Evalene Bohr maintain his nitroGLYCERIN , aspirin  EC, testosterone  cypionate, sildenafil , metoprolol  succinate, ALPRAZolam , and HYDROcodone -acetaminophen .  No orders of the defined types were placed in this encounter.

## 2024-05-25 NOTE — Assessment & Plan Note (Signed)
 Continues to abstain.  He has a 35-year history of smoking.  Quit 2016.  Discussed LDCT screening -patient denies at this time.

## 2024-05-25 NOTE — Assessment & Plan Note (Signed)
 Well controlled, no changes to meds. Encouraged heart healthy diet such as the DASH diet and exercise as tolerated.

## 2024-05-25 NOTE — Assessment & Plan Note (Addendum)
 Recheck lipid panel today. Encourage heart healthy diet such as MIND or DASH diet, increase exercise, avoid trans fats, simple carbohydrates and processed foods, consider a krill or fish or flaxseed oil cap daily.  Was unable to tolerate rosuvastatin  r/t leg pain. High risk for cardiovascular events due to elevated cholesterol levels and PMHX. Discussed  PCSK9 inhibitors options at a hyperlipidemia clinic. Patient has been taking fish oil and Super Beats. Consider referral to hyperlipidemia clinic if still elevated.

## 2024-05-26 ENCOUNTER — Ambulatory Visit: Payer: Self-pay | Admitting: Student

## 2024-05-26 DIAGNOSIS — Z789 Other specified health status: Secondary | ICD-10-CM | POA: Insufficient documentation

## 2024-05-26 LAB — LIPID PANEL
Cholesterol: 184 mg/dL (ref 0–200)
HDL: 35.9 mg/dL — ABNORMAL LOW (ref >39.00)
LDL Cholesterol: 104 mg/dL — ABNORMAL HIGH (ref 0–99)
NonHDL: 148.15
Total CHOL/HDL Ratio: 5
Triglycerides: 219 mg/dL — ABNORMAL HIGH (ref 0.0–149.0)
VLDL: 43.8 mg/dL — ABNORMAL HIGH (ref 0.0–40.0)

## 2024-05-26 LAB — CBC WITH DIFFERENTIAL/PLATELET
Basophils Absolute: 0.1 10*3/uL (ref 0.0–0.1)
Basophils Relative: 1.2 % (ref 0.0–3.0)
Eosinophils Absolute: 0.3 10*3/uL (ref 0.0–0.7)
Eosinophils Relative: 3.1 % (ref 0.0–5.0)
HCT: 50.5 % (ref 39.0–52.0)
Hemoglobin: 17.2 g/dL — ABNORMAL HIGH (ref 13.0–17.0)
Lymphocytes Relative: 33.8 % (ref 12.0–46.0)
Lymphs Abs: 2.8 10*3/uL (ref 0.7–4.0)
MCHC: 34.1 g/dL (ref 30.0–36.0)
MCV: 83.7 fl (ref 78.0–100.0)
Monocytes Absolute: 0.7 10*3/uL (ref 0.1–1.0)
Monocytes Relative: 8.4 % (ref 3.0–12.0)
Neutro Abs: 4.4 10*3/uL (ref 1.4–7.7)
Neutrophils Relative %: 53.5 % (ref 43.0–77.0)
Platelets: 205 10*3/uL (ref 150.0–400.0)
RBC: 6.04 Mil/uL — ABNORMAL HIGH (ref 4.22–5.81)
RDW: 14.3 % (ref 11.5–15.5)
WBC: 8.3 10*3/uL (ref 4.0–10.5)

## 2024-05-26 LAB — COMPREHENSIVE METABOLIC PANEL WITH GFR
ALT: 28 U/L (ref 0–53)
AST: 23 U/L (ref 0–37)
Albumin: 4.6 g/dL (ref 3.5–5.2)
Alkaline Phosphatase: 72 U/L (ref 39–117)
BUN: 12 mg/dL (ref 6–23)
CO2: 26 meq/L (ref 19–32)
Calcium: 9.3 mg/dL (ref 8.4–10.5)
Chloride: 101 meq/L (ref 96–112)
Creatinine, Ser: 1.02 mg/dL (ref 0.40–1.50)
GFR: 83 mL/min (ref >60.00)
Glucose, Bld: 90 mg/dL (ref 70–99)
Potassium: 3.8 meq/L (ref 3.5–5.1)
Sodium: 136 meq/L (ref 135–145)
Total Bilirubin: 0.5 mg/dL (ref 0.2–1.2)
Total Protein: 7 g/dL (ref 6.0–8.3)

## 2024-05-26 LAB — TSH: TSH: 3.43 u[IU]/mL (ref 0.35–5.50)

## 2024-05-26 LAB — HEMOGLOBIN A1C: Hgb A1c MFr Bld: 6.4 % (ref 4.6–6.5)

## 2024-06-09 ENCOUNTER — Other Ambulatory Visit: Payer: Self-pay | Admitting: Urology

## 2024-06-09 DIAGNOSIS — R7989 Other specified abnormal findings of blood chemistry: Secondary | ICD-10-CM

## 2024-06-15 ENCOUNTER — Telehealth: Payer: Self-pay | Admitting: Urology

## 2024-06-15 NOTE — Telephone Encounter (Signed)
See msg below. Thanks.

## 2024-06-15 NOTE — Telephone Encounter (Signed)
 Pt called and lvm to request a refill on testosterone  cypionate (DEPOTESTOSTERONE CYPIONATE) 200 MG/ML injection [[id number]]. Please advise.

## 2024-06-16 ENCOUNTER — Telehealth (HOSPITAL_BASED_OUTPATIENT_CLINIC_OR_DEPARTMENT_OTHER): Payer: Self-pay

## 2024-06-16 ENCOUNTER — Other Ambulatory Visit: Payer: Self-pay | Admitting: Urology

## 2024-06-16 DIAGNOSIS — R7989 Other specified abnormal findings of blood chemistry: Secondary | ICD-10-CM

## 2024-06-16 MED ORDER — TESTOSTERONE CYPIONATE 200 MG/ML IM SOLN: INTRAMUSCULAR | 1 refills | Status: DC

## 2024-06-21 ENCOUNTER — Other Ambulatory Visit: Payer: Self-pay | Admitting: Family Medicine

## 2024-06-21 DIAGNOSIS — M170 Bilateral primary osteoarthritis of knee: Secondary | ICD-10-CM

## 2024-06-21 DIAGNOSIS — Z79899 Other long term (current) drug therapy: Secondary | ICD-10-CM

## 2024-06-21 DIAGNOSIS — G8929 Other chronic pain: Secondary | ICD-10-CM

## 2024-06-21 MED ORDER — HYDROCODONE-ACETAMINOPHEN 10-325 MG PO TABS: 1.0000 | ORAL_TABLET | Freq: Three times a day (TID) | ORAL | 0 refills | Status: DC | PRN

## 2024-06-21 NOTE — Telephone Encounter (Signed)
 Requesting: hydrocodone  10-325mg   Contract: 11/13/23 UDS: 11/13/23 Last Visit: 05/25/24 w/ Wheeler Next Visit: 12/07/24 Last Refill: 05/21/24 #90 and 0RF  Please Advise

## 2024-06-21 NOTE — Telephone Encounter (Unsigned)
 Copied from CRM [id number]. Topic: Clinical - Medication Refill >> Jun 21, 2024  9:14 AM Elijah Marquez wrote: Medication: HYDROcodone -acetaminophen  (NORCO) 10-325 MG tablet  Has the patient contacted their pharmacy? Yes  This is the patient's preferred pharmacy:  [facility] [address], [place] - [name] [place] [address] Phone: [phone] Fax: [phone]  Is this the correct pharmacy for this prescription? Yes  Has the prescription been filled recently? Yes  Is the patient out of the medication? Yes  Has the patient been seen for an appointment in the last year OR does the patient have an upcoming appointment? Yes  Can we respond through MyChart? Yes  Agent: Please be advised that Rx refills may take up to 3 business days. We ask that you follow-up with your pharmacy.

## 2024-06-26 ENCOUNTER — Other Ambulatory Visit: Payer: Self-pay | Admitting: Family Medicine

## 2024-06-30 ENCOUNTER — Encounter (HOSPITAL_BASED_OUTPATIENT_CLINIC_OR_DEPARTMENT_OTHER): Payer: Self-pay

## 2024-07-17 ENCOUNTER — Other Ambulatory Visit: Payer: Self-pay | Admitting: Family Medicine

## 2024-07-17 DIAGNOSIS — F32A Depression, unspecified: Secondary | ICD-10-CM

## 2024-07-17 DIAGNOSIS — Z79899 Other long term (current) drug therapy: Secondary | ICD-10-CM

## 2024-07-19 NOTE — Telephone Encounter (Signed)
 Requesting: alprazolam  1mg  Contract: Yes UDS: need Last Visit: 11/13/2023 Next Visit: 12/07/24 with yacopino Last Refill: 05/21/24  Please Advise

## 2024-07-21 ENCOUNTER — Other Ambulatory Visit: Payer: Self-pay | Admitting: Family Medicine

## 2024-07-21 DIAGNOSIS — M170 Bilateral primary osteoarthritis of knee: Secondary | ICD-10-CM

## 2024-07-21 DIAGNOSIS — G8929 Other chronic pain: Secondary | ICD-10-CM

## 2024-07-21 DIAGNOSIS — Z79899 Other long term (current) drug therapy: Secondary | ICD-10-CM

## 2024-07-21 MED ORDER — HYDROCODONE-ACETAMINOPHEN 10-325 MG PO TABS: 1.0000 | ORAL_TABLET | Freq: Three times a day (TID) | ORAL | 0 refills | Status: DC | PRN

## 2024-07-21 NOTE — Telephone Encounter (Signed)
 Requesting: hydrocodone  10-325mg   Contract: 11/13/23 UDS: 11/13/23 Last Visit: 05/25/24 w/ Harlene GRADE Next Visit: 12/07/24 Last Refill: 06/21/24 #90 and 0RF   Please Advise

## 2024-07-21 NOTE — Telephone Encounter (Signed)
 Copied from CRM [id number]. Topic: Clinical - Medication Refill >> Jul 21, 2024  3:49 PM Lauren C wrote: Medication: HYDROcodone -acetaminophen  (NORCO) 10-325 MG tablet  Has the patient contacted their pharmacy? Yes (Agent: If no, request that the patient contact the pharmacy for the refill. If patient does not wish to contact the pharmacy document the reason why and proceed with request.) (Agent: If yes, when and what did the pharmacy advise?)  He needs to call us  for more refills.  This is the patient's preferred pharmacy:  [facility] [address], [place] - [name] [place] [address] Phone: [phone] Fax: [phone]  Is this the correct pharmacy for this prescription? Yes If no, delete pharmacy and type the correct one.   Has the prescription been filled recently? Yes  Is the patient out of the medication? No,1 left  Has the patient been seen for an appointment in the last year OR does the patient have an upcoming appointment? Yes  Can we respond through MyChart? Yes  Agent: Please be advised that Rx refills may take up to 3 business days. We ask that you follow-up with your pharmacy.

## 2024-07-29 NOTE — Telephone Encounter (Signed)
 Spoke with pharmacy who stated they do not need a new script, they have a current one on file.

## 2024-08-05 ENCOUNTER — Telehealth: Payer: Self-pay | Admitting: Urology

## 2024-08-05 NOTE — Telephone Encounter (Signed)
 called and lvm to r/s pt apt on 08/11/24 due to dahlstedt being out of office ANN

## 2024-08-11 ENCOUNTER — Ambulatory Visit: Admitting: Urology

## 2024-08-23 ENCOUNTER — Other Ambulatory Visit: Payer: Self-pay | Admitting: Family Medicine

## 2024-08-23 DIAGNOSIS — G8929 Other chronic pain: Secondary | ICD-10-CM

## 2024-08-23 DIAGNOSIS — Z79899 Other long term (current) drug therapy: Secondary | ICD-10-CM

## 2024-08-23 DIAGNOSIS — M170 Bilateral primary osteoarthritis of knee: Secondary | ICD-10-CM

## 2024-08-23 NOTE — Telephone Encounter (Unsigned)
 Copied from CRM [id number]. Topic: Clinical - Medication Refill >> Aug 23, 2024  8:24 AM Carlatta H wrote: Medication: HYDROcodone -acetaminophen  (NORCO) 10-325 MG tablet  Has the patient contacted their pharmacy? Yes (Agent: If no, request that the patient contact the pharmacy for the refill. If patient does not wish to contact the pharmacy document the reason why and proceed with request.) (Agent: If yes, when and what did the pharmacy advise?)Contact office  This is the patient's preferred pharmacy:  [facility] [address], [place] - [name] [place] [address] Phone: [phone] Fax: [phone]   Is this the correct pharmacy for this prescription? Yes If no, delete pharmacy and type the correct one.   Has the prescription been filled recently? No  Is the patient out of the medication? Yes  Has the patient been seen for an appointment in the last year OR does the patient have an upcoming appointment? Yes  Can we respond through MyChart? Yes  Agent: Please be advised that Rx refills may take up to 3 business days. We ask that you follow-up with your pharmacy.

## 2024-08-24 MED ORDER — HYDROCODONE-ACETAMINOPHEN 10-325 MG PO TABS: 1.0000 | ORAL_TABLET | Freq: Three times a day (TID) | ORAL | 0 refills | Status: DC | PRN

## 2024-08-24 NOTE — Telephone Encounter (Signed)
 Requesting: hydrocodone  10-325mg   Contract:11/13/23 UDS: 11/13/23 Last Visit: 05/25/24 w/ Harlene GRADE Next Visit: 12/07/24 w/ Harlene GRADE Last Refill: 07/21/24 #90 and 0RF   Please Advise

## 2024-08-24 NOTE — Progress Notes (Deleted)
 Chief Complaint: No chief complaint on file.   History of Present Illness:  Elijah Marquez is a 53 y.o. male who is seen in consultation from Domenica Harlene LABOR, MD for evaluation of hypogonadism as well as low energy level.  The patient has been on testosterone  repletion with transdermal preparations for about 10 years.  He has had a difficult time having physiologic levels despite being on 4 pumps of AndroGel  a day.  His last testosterone  level before starting on injections was 120.  3.10.2025: Here today for recheck.  He has been on testosterone  cypionate administered intramuscular at home weekly-he injects on Monday evenings.  He has not injected yet today.  He states that his energy level is still low.  He has not had recent laboratories.  Past Medical History:  Past Medical History:  Diagnosis Date   Anxiety 08/23/2012   Anxiety as acute reaction to exceptional stress 09/20/2012   Chicken pox as a child   Chronic lower back pain    Fatigue    History of tobacco abuse    Failed Chantix  and wellbutrin    Hyperlipidemia 08/23/2012   Hypertension    Hypogonadism in male 10/02/2014   Intermittent claudication 08/13/2015   B/l legs   Neck pain on left side 08/23/2012   Osteoarthritis    all over (07/02/2017)   Osteoarthritis of both knees    Tobacco abuse     Past Surgical History:  Past Surgical History:  Procedure Laterality Date   ANTERIOR CERVICAL DECOMP/DISCECTOMY FUSION  12/08/2012   C4,5,,6   BACK SURGERY     CORONARY ANGIOPLASTY WITH STENT PLACEMENT  07/02/2017   1 stent   CORONARY STENT INTERVENTION N/A 07/02/2017   Procedure: CORONARY STENT INTERVENTION;  Surgeon: Burnard Debby LABOR, MD;  Location: [facility] INVASIVE CV LAB;  Service: Cardiovascular;  Laterality: N/A;  LAD/DIAG   KNEE ARTHROSCOPY Bilateral 2011-2012   left-right   LEFT HEART CATH AND CORONARY ANGIOGRAPHY N/A 07/02/2017   Procedure: Left Heart Cath and Coronary Angiography;  Surgeon: Burnard Debby LABOR,  MD;  Location: [facility] INVASIVE CV LAB;  Service: Cardiovascular;  Laterality: N/A;   MULTIPLE TOOTH EXTRACTIONS     WISDOM TOOTH EXTRACTION      Allergies:  Allergies  Allergen Reactions   Atorvastatin  Other (See Comments)    Caused leg pain   Rosuvastatin      myalgia   Penicillins Swelling and Rash    Has patient had a PCN reaction causing immediate rash, facial/tongue/throat swelling, SOB or lightheadedness with hypotension: Yes Has patient had a PCN reaction causing severe rash involving mucus membranes or skin necrosis: No Has patient had a PCN reaction that required hospitalization: Yes Has patient had a PCN reaction occurring within the last 10 years: No If all of the above answers are NO, then may proceed with Cephalosporin use.     Family History:  Family History  Problem Relation Age of Onset   Fibromyalgia Mother    Coronary artery disease Mother    Hypertension Mother    Heart attack Mother    Heart disease Mother        CAD   Peripheral vascular disease Mother 46       hemorrhage from vascular procedure   Alcohol abuse Father    Fibromyalgia Sister    Hypertension Sister    Heart disease Sister    Lupus Sister    Emphysema Sister        smoker   Kidney disease  Daughter    Other Son        mildly mentally handicap   Seizures Son    Colon cancer Neg Hx    Colon polyps Neg Hx    Crohn's disease Neg Hx    Esophageal cancer Neg Hx    Rectal cancer Neg Hx    Stomach cancer Neg Hx    Ulcerative colitis Neg Hx     Social History:  Social History   Tobacco Use   Smoking status: Former    Current packs/day: 0.00    Average packs/day: 1.5 packs/day for 35.0 years (52.5 ttl pk-yrs)    Types: Cigarettes    Start date: 07/30/1980    Quit date: 07/31/2015    Years since quitting: 9.0    Passive exposure: Never   Smokeless tobacco: Never  Vaping Use   Vaping status: Former   Quit date: 06/16/2017  Substance Use Topics   Alcohol use: No   Drug use: No     Review of symptoms:  Constitutional:  Negative for unexplained weight loss, night sweats, fever, chills ENT:  Negative for nose bleeds, sinus pain, painful swallowing CV:  Negative for chest pain, shortness of breath, exercise intolerance, palpitations, loss of consciousness Resp:  Negative for cough, wheezing, shortness of breath GI:  Negative for nausea, vomiting, diarrhea, bloody stools GU:  Positives noted in HPI; otherwise negative for gross hematuria, dysuria, urinary incontinence Neuro:  Negative for seizures, poor balance, limb weakness, slurred speech Psych:  Negative for lack of energy, depression, anxiety Endocrine:  Negative for polydipsia, polyuria, symptoms of hypoglycemia (dizziness, hunger, sweating) Hematologic:  Negative for anemia, purpura, petechia, prolonged or excessive bleeding, use of anticoagulants  Allergic:  Negative for difficulty breathing or choking as a result of exposure to anything; no shellfish allergy; no allergic response (rash/itch) to materials, foods  Physical exam: There were no vitals taken for this visit. GENERAL APPEARANCE:  Well appearing, well developed, well nourished, NAD HEENT: Atraumatic, Normocephalic. NECK: Normal appearance LUNGS: Normal inspiratory and expiratory excursion HEART: Regular Rate MENTAL STATUS:  Appropriate. SKIN:  Warm, dry and intact.    Results: No results found for this or any previous visit (from the past 24 hours).  I have reviewed referring/prior physicians notes  I have reviewed most recent urine/blood tox studies.  Positive for hydrocodone  and hydromorphone   I have reviewed PSA results--0.43.   Assessment: Low testosterone  level.  Now on IM testosterone  cypionate 100 mg IM weekly.  ED, organic   Plan: -Trough level of testosterone  drawn today-Free and total  -I would like to have him come in either tomorrow/Wednesday of this week or next week for peak levels, notify of results  -Office visit  in 6 months following hemoglobin, hematocrit, testosterone , PSA levels

## 2024-08-25 ENCOUNTER — Ambulatory Visit: Admitting: Urology

## 2024-08-27 ENCOUNTER — Other Ambulatory Visit

## 2024-08-27 ENCOUNTER — Other Ambulatory Visit: Payer: Self-pay

## 2024-08-27 DIAGNOSIS — R7989 Other specified abnormal findings of blood chemistry: Secondary | ICD-10-CM

## 2024-08-29 LAB — PSA: Prostate Specific Ag, Serum: 1.1 ng/mL (ref 0.0–4.0)

## 2024-08-29 LAB — HEMOGLOBIN AND HEMATOCRIT, BLOOD
Hematocrit: 52.9 % — ABNORMAL HIGH (ref 37.5–51.0)
Hemoglobin: 17.7 g/dL (ref 13.0–17.7)

## 2024-08-29 LAB — TESTOSTERONE: Testosterone: 927 ng/dL — ABNORMAL HIGH (ref 264–916)

## 2024-08-30 ENCOUNTER — Ambulatory Visit: Payer: Self-pay | Admitting: Urology

## 2024-08-30 ENCOUNTER — Other Ambulatory Visit: Payer: Self-pay | Admitting: Urology

## 2024-08-30 DIAGNOSIS — R7989 Other specified abnormal findings of blood chemistry: Secondary | ICD-10-CM

## 2024-09-16 ENCOUNTER — Telehealth: Payer: Self-pay | Admitting: Urology

## 2024-09-16 NOTE — Telephone Encounter (Signed)
 called pt to r/s due to Dahlstedt being out of office ANN 09/16/24

## 2024-09-17 NOTE — Telephone Encounter (Signed)
 called  and lvm pt to r/s due to Dahlstedt being out of office ANN 09/16/24

## 2024-09-21 ENCOUNTER — Telehealth: Payer: Self-pay | Admitting: Urology

## 2024-09-21 NOTE — Telephone Encounter (Signed)
 called  and lvm pt to r/s due to Dahlstedt being out of office ANN 09/21/24

## 2024-09-21 NOTE — Telephone Encounter (Signed)
 SABRA

## 2024-09-23 ENCOUNTER — Other Ambulatory Visit: Payer: Self-pay | Admitting: Family

## 2024-09-23 DIAGNOSIS — Z79899 Other long term (current) drug therapy: Secondary | ICD-10-CM

## 2024-09-23 DIAGNOSIS — G8929 Other chronic pain: Secondary | ICD-10-CM

## 2024-09-23 DIAGNOSIS — F419 Anxiety disorder, unspecified: Secondary | ICD-10-CM

## 2024-09-23 DIAGNOSIS — M170 Bilateral primary osteoarthritis of knee: Secondary | ICD-10-CM

## 2024-09-23 NOTE — Telephone Encounter (Unsigned)
 Copied from CRM [id number]. Topic: Clinical - Medication Refill >> Sep 23, 2024 11:07 AM Lauren C wrote: Medication:  HYDROcodone -acetaminophen  (NORCO) 10-325 MG tablet  ALPRAZolam  (XANAX ) 1 MG tablet  Has the patient contacted their pharmacy? Yes No refills.  This is the patient's preferred pharmacy:  [facility] [address], [place] - [name] [place] [address] Phone: [phone] Fax: [phone]  Is this the correct pharmacy for this prescription? Yes If no, delete pharmacy and type the correct one.   Has the prescription been filled recently? Yes  Is the patient out of the medication? No, 3 left  Has the patient been seen for an appointment in the last year OR does the patient have an upcoming appointment? Yes  Can we respond through MyChart? Yes  Agent: Please be advised that Rx refills may take up to 3 business days. We ask that you follow-up with your pharmacy.

## 2024-09-24 MED ORDER — ALPRAZOLAM 1 MG PO TABS: 1.0000 mg | ORAL_TABLET | Freq: Two times a day (BID) | ORAL | 0 refills | Status: DC | PRN

## 2024-09-24 MED ORDER — HYDROCODONE-ACETAMINOPHEN 10-325 MG PO TABS: 1.0000 | ORAL_TABLET | Freq: Three times a day (TID) | ORAL | 0 refills | Status: DC | PRN

## 2024-10-11 ENCOUNTER — Other Ambulatory Visit: Admitting: Urology

## 2024-10-11 ENCOUNTER — Other Ambulatory Visit: Payer: Self-pay

## 2024-10-11 ENCOUNTER — Other Ambulatory Visit

## 2024-10-11 DIAGNOSIS — R7989 Other specified abnormal findings of blood chemistry: Secondary | ICD-10-CM

## 2024-10-13 ENCOUNTER — Ambulatory Visit: Admitting: Urology

## 2024-10-13 NOTE — Progress Notes (Signed)
 Assessment: Low testosterone  level.  Now on IM testosterone  cypionate 100 mg IM weekly with normal laboratory parameters  ED, organic--medical therapy doing well   Plan: -Continue same administration schedule and dosing-100 mg weekly  - We will have him come back in 1 year for recheck  History of Present Illness:  9.29.2025: Initial visit for evaluation of hypogonadism as well as low energy level.  The patient has been on testosterone  repletion with transdermal preparations for about 10 years.  He has had a difficult time having physiologic levels despite being on 4 pumps of AndroGel  a day.  His last testosterone  level before starting on injections was 120.  3.10.2025: Here today for recheck.  He has been on testosterone  cypionate administered intramuscular at home weekly-he injects on Monday evenings.  He has not injected yet today.  He states that his energy level is still low.  He has not had recent laboratories.  11.17.2025: Here today for recheck.  Injects on 100 mg weekly, doing well.  Most recent labs all normal-hemoglobin/hematocrit 17.4/52.3, testosterone  midcycle 584, PSA 0.6.  He feels good with his current injection schedule   Past Medical History:  Past Medical History:  Diagnosis Date   Anxiety 08/23/2012   Anxiety as acute reaction to exceptional stress 09/20/2012   Chicken pox as a child   Chronic lower back pain    Fatigue    History of tobacco abuse    Failed Chantix  and wellbutrin    Hyperlipidemia 08/23/2012   Hypertension    Hypogonadism in male 10/02/2014   Intermittent claudication 08/13/2015   B/l legs   Neck pain on left side 08/23/2012   Osteoarthritis    all over (07/02/2017)   Osteoarthritis of both knees    Tobacco abuse     Past Surgical History:  Past Surgical History:  Procedure Laterality Date   ANTERIOR CERVICAL DECOMP/DISCECTOMY FUSION  12/08/2012   C4,5,,6   BACK SURGERY     CORONARY ANGIOPLASTY WITH STENT PLACEMENT   07/02/2017   1 stent   CORONARY STENT INTERVENTION N/A 07/02/2017   Procedure: CORONARY STENT INTERVENTION;  Surgeon: Burnard Debby LABOR, MD;  Location: [facility] INVASIVE CV LAB;  Service: Cardiovascular;  Laterality: N/A;  LAD/DIAG   KNEE ARTHROSCOPY Bilateral 2011-2012   left-right   LEFT HEART CATH AND CORONARY ANGIOGRAPHY N/A 07/02/2017   Procedure: Left Heart Cath and Coronary Angiography;  Surgeon: Burnard Debby LABOR, MD;  Location: [facility] INVASIVE CV LAB;  Service: Cardiovascular;  Laterality: N/A;   MULTIPLE TOOTH EXTRACTIONS     WISDOM TOOTH EXTRACTION      Allergies:  Allergies  Allergen Reactions   Atorvastatin  Other (See Comments)    Caused leg pain   Rosuvastatin      myalgia   Penicillins Swelling and Rash    Has patient had a PCN reaction causing immediate rash, facial/tongue/throat swelling, SOB or lightheadedness with hypotension: Yes Has patient had a PCN reaction causing severe rash involving mucus membranes or skin necrosis: No Has patient had a PCN reaction that required hospitalization: Yes Has patient had a PCN reaction occurring within the last 10 years: No If all of the above answers are NO, then may proceed with Cephalosporin use.     Family History:  Family History  Problem Relation Age of Onset   Fibromyalgia Mother    Coronary artery disease Mother    Hypertension Mother    Heart attack Mother    Heart disease Mother  CAD   Peripheral vascular disease Mother 59       hemorrhage from vascular procedure   Alcohol abuse Father    Fibromyalgia Sister    Hypertension Sister    Heart disease Sister    Lupus Sister    Emphysema Sister        smoker   Kidney disease Daughter    Other Son        mildly mentally handicap   Seizures Son    Colon cancer Neg Hx    Colon polyps Neg Hx    Crohn's disease Neg Hx    Esophageal cancer Neg Hx    Rectal cancer Neg Hx    Stomach cancer Neg Hx    Ulcerative colitis Neg Hx     Social History:  Social History    Tobacco Use   Smoking status: Former    Current packs/day: 0.00    Average packs/day: 1.5 packs/day for 35.0 years (52.5 ttl pk-yrs)    Types: Cigarettes    Start date: 07/30/1980    Quit date: 07/31/2015    Years since quitting: 9.2    Passive exposure: Never   Smokeless tobacco: Never  Vaping Use   Vaping status: Former   Quit date: 06/16/2017  Substance Use Topics   Alcohol use: No   Drug use: No    Review of symptoms:  Constitutional:  Negative for unexplained weight loss, night sweats, fever, chills ENT:  Negative for nose bleeds, sinus pain, painful swallowing CV:  Negative for chest pain, shortness of breath, exercise intolerance, palpitations, loss of consciousness Resp:  Negative for cough, wheezing, shortness of breath GI:  Negative for nausea, vomiting, diarrhea, bloody stools GU:  Positives noted in HPI; otherwise negative for gross hematuria, dysuria, urinary incontinence Neuro:  Negative for seizures, poor balance, limb weakness, slurred speech Psych:  Negative for lack of energy, depression, anxiety Endocrine:  Negative for polydipsia, polyuria, symptoms of hypoglycemia (dizziness, hunger, sweating) Hematologic:  Negative for anemia, purpura, petechia, prolonged or excessive bleeding, use of anticoagulants  Allergic:  Negative for difficulty breathing or choking as a result of exposure to anything; no shellfish allergy; no allergic response (rash/itch) to materials, foods  I have reviewed prior urology notes  Recent hemoglobin/hematocrit/testosterone  levels were reviewed  I have reviewed PSA results--0.43.

## 2024-10-14 ENCOUNTER — Other Ambulatory Visit: Payer: Self-pay

## 2024-10-14 DIAGNOSIS — R7989 Other specified abnormal findings of blood chemistry: Secondary | ICD-10-CM

## 2024-10-15 LAB — TESTOSTERONE: Testosterone: 584 ng/dL (ref 264–916)

## 2024-10-15 LAB — HEMOGLOBIN AND HEMATOCRIT, BLOOD
Hematocrit: 52.3 % — ABNORMAL HIGH (ref 37.5–51.0)
Hemoglobin: 17.4 g/dL (ref 13.0–17.7)

## 2024-10-15 LAB — PSA: Prostate Specific Ag, Serum: 0.6 ng/mL (ref 0.0–4.0)

## 2024-10-18 ENCOUNTER — Ambulatory Visit (INDEPENDENT_AMBULATORY_CARE_PROVIDER_SITE_OTHER): Admitting: Urology

## 2024-10-18 ENCOUNTER — Ambulatory Visit: Payer: Self-pay | Admitting: Urology

## 2024-10-18 VITALS — BP 149/76 | HR 57 | Ht 69.0 in | Wt 190.0 lb

## 2024-10-18 DIAGNOSIS — R7989 Other specified abnormal findings of blood chemistry: Secondary | ICD-10-CM | POA: Diagnosis not present

## 2024-10-18 DIAGNOSIS — N529 Male erectile dysfunction, unspecified: Secondary | ICD-10-CM

## 2024-10-22 ENCOUNTER — Other Ambulatory Visit: Payer: Self-pay | Admitting: Family Medicine

## 2024-10-22 DIAGNOSIS — G8929 Other chronic pain: Secondary | ICD-10-CM

## 2024-10-22 DIAGNOSIS — Z79899 Other long term (current) drug therapy: Secondary | ICD-10-CM

## 2024-10-22 DIAGNOSIS — M170 Bilateral primary osteoarthritis of knee: Secondary | ICD-10-CM

## 2024-10-22 NOTE — Telephone Encounter (Unsigned)
 Copied from CRM [id number]. Topic: Clinical - Medication Refill >> Oct 22, 2024 11:21 AM Hadassah PARAS wrote: Medication: HYDROcodone -acetaminophen  (NORCO) 10-325 MG tablet   Has the patient contacted their pharmacy? Yes (Agent: If no, request that the patient contact the pharmacy for the refill. If patient does not wish to contact the pharmacy document the reason why and proceed with request.) (Agent: If yes, when and what did the pharmacy advise?)  This is the patient's preferred pharmacy:  [facility] [address] [place] - [name] [place] [address] Phone: [phone] Fax: [phone]   Is this the correct pharmacy for this prescription? Yes If no, delete pharmacy and type the correct one.   Has the prescription been filled recently? Yes  Is the patient out of the medication? No  Has the patient been seen for an appointment in the last year OR does the patient have an upcoming appointment? Yes  Can we respond through MyChart? Yes  Agent: Please be advised that Rx refills may take up to 3 business days. We ask that you follow-up with your pharmacy.

## 2024-10-26 MED ORDER — HYDROCODONE-ACETAMINOPHEN 10-325 MG PO TABS: 1.0000 | ORAL_TABLET | Freq: Three times a day (TID) | ORAL | 0 refills | Status: DC | PRN

## 2024-10-26 NOTE — Telephone Encounter (Signed)
 Requesting: hydrocodone  10-325mg   Contract: 11/13/23 UDS: 11/13/23 Last Visit: 05/25/24 Next Visit:12/07/24 w/ Harlene GRADE Last Refill: 09/24/24 #90 and 0RF   Please Advise

## 2024-10-26 NOTE — Telephone Encounter (Signed)
 Copied from CRM #[id number]. Topic: Clinical - Medication Question >> Oct 26, 2024  9:13 AM Thersia BROCKS wrote: Reason for CRM: Patient called in regarding prescription HYDROcodone -acetaminophen  (NORCO) 10-325 MG tablet wanted to know the status as  Patient is completely out

## 2024-10-29 ENCOUNTER — Other Ambulatory Visit: Payer: Self-pay | Admitting: Urology

## 2024-10-29 DIAGNOSIS — R7989 Other specified abnormal findings of blood chemistry: Secondary | ICD-10-CM

## 2024-11-02 ENCOUNTER — Other Ambulatory Visit: Payer: Self-pay

## 2024-11-02 DIAGNOSIS — N5201 Erectile dysfunction due to arterial insufficiency: Secondary | ICD-10-CM

## 2024-11-02 NOTE — Telephone Encounter (Signed)
-----   Message from Woodbine T sent at 11/02/2024  1:28 PM EST ----- Regarding: refill Patient called in and stated he needs a refill of testosterone  cypionate (DEPOTESTOSTERONE CYPIONATE) 200 MG/ML injection [[id number]]

## 2024-11-04 ENCOUNTER — Other Ambulatory Visit

## 2024-11-04 DIAGNOSIS — N5201 Erectile dysfunction due to arterial insufficiency: Secondary | ICD-10-CM

## 2024-11-04 LAB — URINALYSIS, ROUTINE W REFLEX MICROSCOPIC
Bilirubin, UA: NEGATIVE
Glucose, UA: NEGATIVE
Ketones, UA: NEGATIVE
Leukocytes,UA: NEGATIVE
Nitrite, UA: NEGATIVE
Protein,UA: NEGATIVE
RBC, UA: NEGATIVE
Specific Gravity, UA: 1.02 (ref 1.005–1.030)
Urobilinogen, Ur: 0.2 mg/dL (ref 0.2–1.0)
pH, UA: 5.5 (ref 5.0–7.5)

## 2024-11-22 ENCOUNTER — Other Ambulatory Visit: Payer: Self-pay | Admitting: Family Medicine

## 2024-11-22 DIAGNOSIS — G8929 Other chronic pain: Secondary | ICD-10-CM

## 2024-11-22 DIAGNOSIS — Z79899 Other long term (current) drug therapy: Secondary | ICD-10-CM

## 2024-11-22 DIAGNOSIS — M170 Bilateral primary osteoarthritis of knee: Secondary | ICD-10-CM

## 2024-11-22 MED ORDER — HYDROCODONE-ACETAMINOPHEN 10-325 MG PO TABS: 1.0000 | ORAL_TABLET | Freq: Three times a day (TID) | ORAL | 0 refills | Status: DC | PRN

## 2024-11-22 NOTE — Telephone Encounter (Signed)
 Requesting: hydrocodone   Contract: 11/13/23 UDS:11/13/23 Last Visit: 05/25/24 w/ Harlene Jolly Next Visit: 12/07/24 Last Refill: 10/26/24 #90 and 0RF   Please Advise

## 2024-11-22 NOTE — Telephone Encounter (Unsigned)
 Copied from CRM #[id number]. Topic: Clinical - Medication Refill >> Nov 22, 2024  9:44 AM [name] wrote: Medication: HYDROcodone -acetaminophen  (NORCO) 10-325 MG tablet  Has the patient contacted their pharmacy? Yes (Agent: If no, request that the patient contact the pharmacy for the refill. If patient does not wish to contact the pharmacy document the reason why and proceed with request.) (Agent: If yes, when and what did the pharmacy advise?)  This is the patient's preferred pharmacy:  [facility] [address], [place] - [address] [place] [address] Phone: [phone] Fax: [phone]  Is this the correct pharmacy for this prescription? Yes If no, delete pharmacy and type the correct one.   Has the prescription been filled recently? Yes  Is the patient out of the medication? Yes  Has the patient been seen for an appointment in the last year OR does the patient have an upcoming appointment? Yes  Can we respond through MyChart? Yes  Agent: Please be advised that Rx refills may take up to 3 business days. We ask that you follow-up with your pharmacy.

## 2024-12-01 NOTE — Progress Notes (Signed)
 "  Subjective:     Patient ID: Elijah Marquez, male    DOB: [birth year], 53 y.o.   MRN: [id number]  No chief complaint on file.   HPI   Elijah Marquez presents for follow-up visit  PMHX- CAD s/p stent, HTN, HLD, Chronic pain, Prediabetes  His blood pressure has been well controlled on his current regimen, including two chewable Force Factor Total Beets daily, with consistently good home readings.  He has chronic neck pain and numbness in his hands and arms after prior cervical spine surgery at [facility]. He manages pain with Norco and 500 mg of Tylenol  at night.  He quit smoking in 2013 after about 35 years of smoking. He eliminated soft drinks and drinks only water.   Hypertension Metoprolol  succinate 25 mg 3 times daily, compliant.   Hyperlipidemia Taking fish oil daily and SuperBeets. Hx statin intolerance.  The 10-year ASCVD risk score (Arnett DK, et al., 2019) is: 8%   Values used to calculate the score:     Age: 56 years     Clinically relevant sex: Male     Is Non-Hispanic African American: No     Diabetic: No     Tobacco smoker: No     Systolic Blood Pressure: 129 mmHg     Is BP treated: Yes     HDL Cholesterol: 35.9 mg/dL     Total Cholesterol: 184 mg/dL   CAD S/p stent. On Aspirin  81 mg, compliant. Nitroglycerin  0.4 mg sublingual-patient denies chest pain and has not had to use nitroglycerin  recently.  Anxiety/depression Alprazolam  1 mg 2 times daily as needed for anxiety.  ED/Hypotestosteronemia-followed by urology Testosterone  IM   Chronic back and neck pain Hydrocodone -acetaminophen  (Norco) 10-325 1 tablet by mouth TID prn, also taking tylenol  prn Psx- Cervical decompression/fusion 2014.   Patient denies fever, chills, SOB, CP, palpitations, dyspnea, edema, HA, vision changes, N/V/D, abdominal pain, urinary symptoms, rash, weight changes, and recent illness or hospitalizations.   History of Present Illness          Health Maintenance Due  Topic  Date Due   Hepatitis B Vaccines 19-59 Average Risk (1 of 3 - 19+ 3-dose series) Never done   Lung Cancer Screening  Never done    Past Medical History:  Diagnosis Date   Anxiety 08/23/2012   Anxiety as acute reaction to exceptional stress 09/20/2012   Chicken pox as a child   Chronic lower back pain    Fatigue    History of tobacco abuse    Failed Chantix  and wellbutrin    Hyperlipidemia 08/23/2012   Hypertension    Hypogonadism in male 10/02/2014   Intermittent claudication 08/13/2015   B/l legs   Neck pain on left side 08/23/2012   Osteoarthritis    all over (07/02/2017)   Osteoarthritis of both knees    Tobacco abuse     Past Surgical History:  Procedure Laterality Date   ANTERIOR CERVICAL DECOMP/DISCECTOMY FUSION  12/08/2012   [facility]   BACK SURGERY     CORONARY ANGIOPLASTY WITH STENT PLACEMENT  07/02/2017   1 stent   CORONARY STENT INTERVENTION N/A 07/02/2017   Procedure: CORONARY STENT INTERVENTION;  Surgeon: Burnard Debby LABOR, MD;  Location: [facility] INVASIVE CV LAB;  Service: Cardiovascular;  Laterality: N/A;  LAD/DIAG   KNEE ARTHROSCOPY Bilateral 2011-2012   left-right   LEFT HEART CATH AND CORONARY ANGIOGRAPHY N/A 07/02/2017   Procedure: Left Heart Cath and Coronary Angiography;  Surgeon: Burnard Debby LABOR, MD;  Location: [facility]  INVASIVE CV LAB;  Service: Cardiovascular;  Laterality: N/A;   MULTIPLE TOOTH EXTRACTIONS     WISDOM TOOTH EXTRACTION      Family History  Problem Relation Age of Onset   Fibromyalgia Mother    Coronary artery disease Mother    Hypertension Mother    Heart attack Mother    Heart disease Mother        CAD   Peripheral vascular disease Mother 81       hemorrhage from vascular procedure   Alcohol abuse Father    Fibromyalgia Sister    Hypertension Sister    Heart disease Sister    Lupus Sister    Emphysema Sister        smoker   Kidney disease Daughter    Other Son        mildly mentally handicap   Seizures Son    Colon cancer Neg Hx     Colon polyps Neg Hx    Crohn's disease Neg Hx    Esophageal cancer Neg Hx    Rectal cancer Neg Hx    Stomach cancer Neg Hx    Ulcerative colitis Neg Hx     Social History   Socioeconomic History   Marital status: Married    Spouse name: Not on file   Number of children: Not on file   Years of education: Not on file   Highest education level: Not on file  Occupational History   Not on file  Tobacco Use   Smoking status: Former    Current packs/day: 0.00    Average packs/day: 1.5 packs/day for 32.0 years (48.0 ttl pk-yrs)    Types: Cigarettes    Start date: 07/30/1980    Quit date: 07/30/2012    Years since quitting: 12.3    Passive exposure: Never   Smokeless tobacco: Never  Vaping Use   Vaping status: Former   Quit date: 06/16/2017  Substance and Sexual Activity   Alcohol use: No   Drug use: No   Sexual activity: Not Currently    Partners: Female  Other Topics Concern   Not on file  Social History Narrative   Not on file   Social Drivers of Health   Tobacco Use: Medium Risk (12/07/2024)   Patient History    Smoking Tobacco Use: Former    Smokeless Tobacco Use: Never    Passive Exposure: Never  Programmer, 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 [employer] Insecurity: Not on file  Transportation Needs: Not on file  Physical Activity: Not on file  Stress: Not on file  Social Connections: Unknown (04/13/2022)   [facility]   Social Network    Social Network: Not on file  Intimate Partner Violence: Unknown (03/06/2022)   Received from [facility]   HITS    Physically Hurt: Not on file    Insult or Talk Down To: Not on file    Threaten Physical Harm: Not on file    Scream or Curse: Not on file  Depression (PHQ2-9): Low Risk (12/07/2024)   Depression (PHQ2-9)    PHQ-2 Score: 4  Alcohol Screen: Not on file  Housing: Not on file  Utilities: Not on file  Health Literacy: Not on file    Outpatient Medications Prior to Visit  Medication Sig Dispense Refill    ALPRAZolam  (XANAX ) 1 MG tablet Take 1 tablet by mouth twice daily as needed for anxiety 60 tablet 0   aspirin  EC 81 MG tablet Take 1 tablet (81  mg total) by mouth daily. Swallow whole. 30 tablet 12   HYDROcodone -acetaminophen  (NORCO) 10-325 MG tablet Take 1 tablet by mouth 3 (three) times daily as needed. 90 tablet 0   metoprolol  succinate (TOPROL -XL) 25 MG 24 hr tablet Take 3 tablets (75 mg total) by mouth daily. 270 tablet 1   nitroGLYCERIN  (NITROSTAT ) 0.4 MG SL tablet Place 1 tablet (0.4 mg total) under the tongue every 5 (five) minutes as needed for chest pain. To ER if no resolution 25 tablet 3   sildenafil  (VIAGRA ) 100 MG tablet 1/2 to 1 tablet p.o. as needed 20 tablet prn   testosterone  cypionate (DEPOTESTOSTERONE CYPIONATE) 200 MG/ML injection INJECT 1/2 (ONE-HALF) ML INTRAMUSCULARLY  ONCE A WEEK 4 mL 0   No facility-administered medications prior to visit.    Allergies  Allergen Reactions   Atorvastatin  Other (See Comments)    Caused leg pain   Rosuvastatin      myalgia   Penicillins Swelling and Rash    Has patient had a PCN reaction causing immediate rash, facial/tongue/throat swelling, SOB or lightheadedness with hypotension: Yes Has patient had a PCN reaction causing severe rash involving mucus membranes or skin necrosis: No Has patient had a PCN reaction that required hospitalization: Yes Has patient had a PCN reaction occurring within the last 10 years: No If all of the above answers are NO, then may proceed with Cephalosporin use.     ROS   See HPI Objective:    Physical Exam  General: No acute distress. Awake and conversant.  Eyes: Normal conjunctiva, anicteric. Round symmetric pupils.   Respiratory: CTAB. Respirations are non-labored. No wheezing.  Skin: Warm. No rashes or ulcers.  Psych: Alert and oriented. Cooperative, Appropriate mood and affect, Normal judgment.  CV: RRR. No murmur. No lower extremity edema.  MSK: Normal ambulation. No clubbing or  cyanosis.  Neuro:  CN II-XII grossly normal.    BP 129/73   Pulse (!) 58   Ht 5' 9 (1.753 m)   Wt 190 lb 3.2 oz (86.3 kg)   SpO2 95%   BMI 28.09 kg/m  Wt Readings from Last 3 Encounters:  12/07/24 190 lb 3.2 oz (86.3 kg)  10/18/24 190 lb (86.2 kg)  05/25/24 200 lb 3.2 oz (90.8 kg)       Assessment & Plan:   Problem List Items Addressed This Visit       Cardiovascular and Mediastinum   Coronary artery disease involving native coronary artery of native heart with angina pectoris   Asymptomatic. Continue medical management       Relevant Medications   ezetimibe  (ZETIA ) 10 MG tablet   Other Relevant Orders   HgB A1c   Comp Met (CMET)   Essential hypertension - Primary   Well controlled, no changes to meds. Encouraged heart healthy diet such as the DASH diet and exercise as tolerated.        Relevant Medications   ezetimibe  (ZETIA ) 10 MG tablet   Other Relevant Orders   CBC     Other   Anxiety   Stable with prn use of alprazolam . Continue same.  Update UDS today        Chronic back pain   Reports that hydrocodone  does continue to provide him some relief from his chronic neck pain. Continue same.  Last medication contract 11/19/23; Update at next vOV UDS updated today           History of tobacco abuse   Encourage LDCT, referral placed last OV  Hyperlipidemia    Hx statin intolerance. ASCVD risk score 10.3% LDL goal<70 -Start Zetia  10 mg daily -FU 3 months lipid panel Pt declines referral to lipid clinic. Update lipid panel today. Encourage heart healthy diet such as MIND or DASH diet, increase exercise, avoid trans fats, simple carbohydrates and processed foods, consider a krill or fish or flaxseed oil cap daily.        Relevant Medications   ezetimibe  (ZETIA ) 10 MG tablet   Other Relevant Orders   Lipid panel   Comp Met (CMET)   Low testosterone  in male   Managed with testosterone . Following with Urology.       Prediabetes   hgba1c  acceptable, minimize simple carbs. Increase exercise as tolerated. Continue current meds       Other Visit Diagnoses       High risk medication use       Relevant Orders   Urine drugs of abuse scrn w alc, routine (Ref Lab)      FU 3 months    I am having Evalene Bohr start on ezetimibe . I am also having him maintain his nitroGLYCERIN , aspirin  EC, sildenafil , metoprolol  succinate, testosterone  cypionate, HYDROcodone -acetaminophen , and ALPRAZolam .  Meds ordered this encounter  Medications   ezetimibe  (ZETIA ) 10 MG tablet    Sig: Take 1 tablet (10 mg total) by mouth daily.    Dispense:  90 tablet    Refill:  1    Supervising Provider:   DOMENICA BLACKBIRD A [[id number]]   "

## 2024-12-03 ENCOUNTER — Other Ambulatory Visit: Payer: Self-pay | Admitting: Family

## 2024-12-03 DIAGNOSIS — Z0000 Encounter for general adult medical examination without abnormal findings: Secondary | ICD-10-CM | POA: Insufficient documentation

## 2024-12-03 DIAGNOSIS — F32A Depression, unspecified: Secondary | ICD-10-CM

## 2024-12-03 DIAGNOSIS — Z79899 Other long term (current) drug therapy: Secondary | ICD-10-CM

## 2024-12-03 NOTE — Assessment & Plan Note (Signed)
 Patient encouraged to maintain heart healthy diet, regular exercise, adequate sleep. Consider daily probiotics. Take medications as prescribed

## 2024-12-06 NOTE — Telephone Encounter (Signed)
 Requesting: alprazolam  1mg   Contract: 11/13/23 UDS: 11/13/23 Last Visit: 05/25/24 w/ Harlene GRADE Next Visit: 12/07/24 w/ Harlene GRADE Last Refill: 09/24/24 #60 and 0RF   Please Advise

## 2024-12-07 ENCOUNTER — Encounter: Payer: Self-pay | Admitting: Student

## 2024-12-07 ENCOUNTER — Ambulatory Visit (INDEPENDENT_AMBULATORY_CARE_PROVIDER_SITE_OTHER): Admitting: Student

## 2024-12-07 VITALS — BP 129/73 | HR 58 | Ht 69.0 in | Wt 190.2 lb

## 2024-12-07 DIAGNOSIS — G8929 Other chronic pain: Secondary | ICD-10-CM | POA: Diagnosis not present

## 2024-12-07 DIAGNOSIS — F419 Anxiety disorder, unspecified: Secondary | ICD-10-CM

## 2024-12-07 DIAGNOSIS — Z87891 Personal history of nicotine dependence: Secondary | ICD-10-CM | POA: Diagnosis not present

## 2024-12-07 DIAGNOSIS — Z79899 Other long term (current) drug therapy: Secondary | ICD-10-CM | POA: Diagnosis not present

## 2024-12-07 DIAGNOSIS — Z0000 Encounter for general adult medical examination without abnormal findings: Secondary | ICD-10-CM

## 2024-12-07 DIAGNOSIS — R7989 Other specified abnormal findings of blood chemistry: Secondary | ICD-10-CM | POA: Insufficient documentation

## 2024-12-07 DIAGNOSIS — I10 Essential (primary) hypertension: Secondary | ICD-10-CM

## 2024-12-07 DIAGNOSIS — M546 Pain in thoracic spine: Secondary | ICD-10-CM

## 2024-12-07 DIAGNOSIS — R7303 Prediabetes: Secondary | ICD-10-CM | POA: Diagnosis not present

## 2024-12-07 DIAGNOSIS — I25119 Atherosclerotic heart disease of native coronary artery with unspecified angina pectoris: Secondary | ICD-10-CM

## 2024-12-07 DIAGNOSIS — E782 Mixed hyperlipidemia: Secondary | ICD-10-CM

## 2024-12-07 MED ORDER — EZETIMIBE 10 MG PO TABS: 10.0000 mg | ORAL_TABLET | Freq: Every day | ORAL | 1 refills | Status: AC

## 2024-12-07 NOTE — Assessment & Plan Note (Signed)
 Well controlled, no changes to meds. Encouraged heart healthy diet such as the DASH diet and exercise as tolerated.

## 2024-12-07 NOTE — Assessment & Plan Note (Addendum)
 Stable with prn use of alprazolam . Continue same.  Update UDS today

## 2024-12-07 NOTE — Assessment & Plan Note (Addendum)
 Reports that hydrocodone  does continue to provide him some relief from his chronic neck pain. Continue same.  Last medication contract 11/19/23; Update at next vOV UDS updated today

## 2024-12-07 NOTE — Assessment & Plan Note (Signed)
 hgba1c acceptable, minimize simple carbs. Increase exercise as tolerated. Continue current meds

## 2024-12-07 NOTE — Assessment & Plan Note (Addendum)
"   Hx statin intolerance. ASCVD risk score 10.3% LDL goal<70 -Start Zetia  10 mg daily -FU 3 months lipid panel Pt declines referral to lipid clinic. Update lipid panel today. Encourage heart healthy diet such as MIND or DASH diet, increase exercise, avoid trans fats, simple carbohydrates and processed foods, consider a krill or fish or flaxseed oil cap daily.   "

## 2024-12-07 NOTE — Assessment & Plan Note (Signed)
Asymptomatic. Continue medical management.

## 2024-12-07 NOTE — Assessment & Plan Note (Signed)
 Encourage LDCT, referral placed last OV

## 2024-12-07 NOTE — Assessment & Plan Note (Addendum)
 Managed with testosterone . Following with Urology.

## 2024-12-08 ENCOUNTER — Ambulatory Visit: Payer: Self-pay | Admitting: Student

## 2024-12-08 ENCOUNTER — Encounter: Payer: Self-pay | Admitting: Student

## 2024-12-08 DIAGNOSIS — E782 Mixed hyperlipidemia: Secondary | ICD-10-CM

## 2024-12-08 LAB — COMPREHENSIVE METABOLIC PANEL WITH GFR
ALT: 37 U/L (ref 3–53)
AST: 28 U/L (ref 5–37)
Albumin: 4.8 g/dL (ref 3.5–5.2)
Alkaline Phosphatase: 72 U/L (ref 39–117)
BUN: 20 mg/dL (ref 6–23)
CO2: 32 meq/L (ref 19–32)
Calcium: 9.4 mg/dL (ref 8.4–10.5)
Chloride: 97 meq/L (ref 96–112)
Creatinine, Ser: 1.04 mg/dL (ref 0.40–1.50)
GFR: 80.78 mL/min
Glucose, Bld: 75 mg/dL (ref 70–99)
Potassium: 3.9 meq/L (ref 3.5–5.1)
Sodium: 137 meq/L (ref 135–145)
Total Bilirubin: 0.5 mg/dL (ref 0.2–1.2)
Total Protein: 7.3 g/dL (ref 6.0–8.3)

## 2024-12-08 LAB — LIPID PANEL
Cholesterol: 185 mg/dL (ref 28–200)
HDL: 29.7 mg/dL — ABNORMAL LOW
LDL Cholesterol: 97 mg/dL (ref 10–99)
NonHDL: 155.29
Total CHOL/HDL Ratio: 6
Triglycerides: 289 mg/dL — ABNORMAL HIGH (ref 10.0–149.0)
VLDL: 57.8 mg/dL — ABNORMAL HIGH (ref 0.0–40.0)

## 2024-12-08 LAB — CBC
HCT: 50.2 % (ref 39.0–52.0)
Hemoglobin: 17 g/dL (ref 13.0–17.0)
MCHC: 33.9 g/dL (ref 30.0–36.0)
MCV: 88.9 fl (ref 78.0–100.0)
Platelets: 209 K/uL (ref 150.0–400.0)
RBC: 5.64 Mil/uL (ref 4.22–5.81)
RDW: 13 % (ref 11.5–15.5)
WBC: 6.7 K/uL (ref 4.0–10.5)

## 2024-12-08 LAB — HEMOGLOBIN A1C: Hgb A1c MFr Bld: 5.7 % (ref 4.6–6.5)

## 2024-12-10 LAB — OPIATES CONFIRMATION, URINE
"  Hydrocodone GC/MS Conf": 793 ng/mL
"  Hydrocodone": POSITIVE — AB
"  Hydromorphone": NEGATIVE
CODEINE: NEGATIVE
MORPHINE: NEGATIVE
Opiates: POSITIVE — AB

## 2024-12-10 LAB — URINE DRUGS OF ABUSE SCREEN W ALC, ROUTINE (REF LAB)
Amphetamines, Urine: NEGATIVE ng/mL
Barbiturate Quant, Ur: NEGATIVE ng/mL
Cannabinoid Quant, Ur: NEGATIVE ng/mL
Cocaine (Metab.): NEGATIVE ng/mL
Creatinine, Urine: 60.3 mg/dL (ref 20.0–300.0)
Ethanol, Urine: NEGATIVE %
Methadone Screen, Urine: NEGATIVE ng/mL
Nitrite Urine, Quantitative: NEGATIVE ug/mL
PCP Quant, Ur: NEGATIVE ng/mL
Propoxyphene: NEGATIVE ng/mL
pH, Urine: 5.3 (ref 4.5–8.9)

## 2024-12-10 LAB — DRUG PROFILE 799031: BENZODIAZEPINES: NEGATIVE

## 2024-12-21 ENCOUNTER — Other Ambulatory Visit: Payer: Self-pay

## 2024-12-21 DIAGNOSIS — G8929 Other chronic pain: Secondary | ICD-10-CM

## 2024-12-21 DIAGNOSIS — M170 Bilateral primary osteoarthritis of knee: Secondary | ICD-10-CM

## 2024-12-21 DIAGNOSIS — Z79899 Other long term (current) drug therapy: Secondary | ICD-10-CM

## 2024-12-21 MED ORDER — HYDROCODONE-ACETAMINOPHEN 10-325 MG PO TABS: 1.0000 | ORAL_TABLET | Freq: Three times a day (TID) | ORAL | 0 refills | Status: AC | PRN

## 2024-12-21 NOTE — Telephone Encounter (Signed)
 Requesting: hydrocodone  Contract: 11/19/23 UDS: 12/07/24 Last Visit: 12/07/24 w/ Harlene GRADE Next Visit: 03/14/25 Last Refill: 11/22/24 #90 and 0RF   Please Advise

## 2024-12-21 NOTE — Telephone Encounter (Signed)
 Copied from CRM [id number]. Topic: Clinical - Medication Refill >> Dec 21, 2024 10:56 AM Elijah Marquez wrote: Medication: HYDROcodone -acetaminophen  (NORCO) 10-325 MG tablet  Has the patient contacted their pharmacy? Yes (Agent: If no, request that the patient contact the pharmacy for the refill. If patient does not wish to contact the pharmacy document the reason why and proceed with request.) (Agent: If yes, when and what did the pharmacy advise?) Call provider  This is the patient's preferred pharmacy:  [facility] [address] [place] - [name] Phone: [phone] Fax: [phone]  Is this the correct pharmacy for this prescription? Yes If no, delete pharmacy and type the correct one.   Has the prescription been filled recently? No  Is the patient out of the medication? Yes  Has the patient been seen for an appointment in the last year OR does the patient have an upcoming appointment? Yes  Can we respond through MyChart? Yes  Agent: Please be advised that Rx refills may take up to 3 business days. We ask that you follow-up with your pharmacy.

## 2025-01-01 ENCOUNTER — Other Ambulatory Visit: Payer: Self-pay | Admitting: Urology

## 2025-01-01 DIAGNOSIS — R7989 Other specified abnormal findings of blood chemistry: Secondary | ICD-10-CM

## 2025-03-14 ENCOUNTER — Ambulatory Visit: Admitting: Family Medicine

## 2025-03-16 ENCOUNTER — Ambulatory Visit: Admitting: Student

## 2025-10-20 ENCOUNTER — Ambulatory Visit: Admitting: Urology
